# Patient Record
Sex: Male | Born: 1950 | Race: White | Hispanic: No | Marital: Married
Health system: Southern US, Community
[De-identification: ages and names within clinical notes are randomized; demographics above are authoritative.]

## PROBLEM LIST (undated history)

## (undated) DIAGNOSIS — I10 Essential (primary) hypertension: Secondary | ICD-10-CM

## (undated) DIAGNOSIS — I25118 Atherosclerotic heart disease of native coronary artery with other forms of angina pectoris: Secondary | ICD-10-CM

## (undated) DIAGNOSIS — E119 Type 2 diabetes mellitus without complications: Secondary | ICD-10-CM

## (undated) DIAGNOSIS — E785 Hyperlipidemia, unspecified: Secondary | ICD-10-CM

## (undated) HISTORY — PX: APPENDECTOMY: SHX54

## (undated) HISTORY — DX: Hyperlipidemia, unspecified: E78.5

## (undated) HISTORY — PX: ACOUSTIC NEUROMA RESECTION: SHX5713

## (undated) HISTORY — DX: Type 2 diabetes mellitus without complications: E11.9

## (undated) HISTORY — DX: Atherosclerotic heart disease of native coronary artery with other forms of angina pectoris: I25.118

---

## 2014-08-16 HISTORY — PX: TRANSTHORACIC ECHOCARDIOGRAM: SHX275

## 2014-08-21 DIAGNOSIS — I2510 Atherosclerotic heart disease of native coronary artery without angina pectoris: Secondary | ICD-10-CM

## 2014-08-21 HISTORY — PX: CARDIAC CATHETERIZATION: SHX172

## 2014-08-21 HISTORY — DX: Atherosclerotic heart disease of native coronary artery without angina pectoris: I25.10

## 2014-08-23 DIAGNOSIS — Z951 Presence of aortocoronary bypass graft: Secondary | ICD-10-CM

## 2014-08-23 HISTORY — PX: CORONARY ARTERY BYPASS GRAFT: SHX141

## 2014-08-23 HISTORY — DX: Presence of aortocoronary bypass graft: Z95.1

## 2016-01-30 DIAGNOSIS — E039 Hypothyroidism, unspecified: Secondary | ICD-10-CM | POA: Diagnosis not present

## 2016-01-30 DIAGNOSIS — Z9181 History of falling: Secondary | ICD-10-CM | POA: Diagnosis not present

## 2016-01-30 DIAGNOSIS — I2510 Atherosclerotic heart disease of native coronary artery without angina pectoris: Secondary | ICD-10-CM | POA: Diagnosis not present

## 2016-01-30 DIAGNOSIS — Z6838 Body mass index (BMI) 38.0-38.9, adult: Secondary | ICD-10-CM | POA: Diagnosis not present

## 2016-01-30 DIAGNOSIS — E1149 Type 2 diabetes mellitus with other diabetic neurological complication: Secondary | ICD-10-CM | POA: Diagnosis not present

## 2016-01-30 DIAGNOSIS — E669 Obesity, unspecified: Secondary | ICD-10-CM | POA: Diagnosis not present

## 2016-01-30 DIAGNOSIS — I10 Essential (primary) hypertension: Secondary | ICD-10-CM | POA: Diagnosis not present

## 2016-01-30 DIAGNOSIS — K219 Gastro-esophageal reflux disease without esophagitis: Secondary | ICD-10-CM | POA: Diagnosis not present

## 2016-01-30 DIAGNOSIS — E785 Hyperlipidemia, unspecified: Secondary | ICD-10-CM | POA: Diagnosis not present

## 2016-01-30 DIAGNOSIS — F329 Major depressive disorder, single episode, unspecified: Secondary | ICD-10-CM | POA: Diagnosis not present

## 2016-01-30 DIAGNOSIS — J209 Acute bronchitis, unspecified: Secondary | ICD-10-CM | POA: Diagnosis not present

## 2016-02-08 DIAGNOSIS — E039 Hypothyroidism, unspecified: Secondary | ICD-10-CM | POA: Diagnosis not present

## 2016-02-08 DIAGNOSIS — E1149 Type 2 diabetes mellitus with other diabetic neurological complication: Secondary | ICD-10-CM | POA: Diagnosis not present

## 2016-02-08 DIAGNOSIS — I2510 Atherosclerotic heart disease of native coronary artery without angina pectoris: Secondary | ICD-10-CM | POA: Diagnosis not present

## 2016-02-08 DIAGNOSIS — E669 Obesity, unspecified: Secondary | ICD-10-CM | POA: Diagnosis not present

## 2016-02-08 DIAGNOSIS — Z6837 Body mass index (BMI) 37.0-37.9, adult: Secondary | ICD-10-CM | POA: Diagnosis not present

## 2016-02-08 DIAGNOSIS — I10 Essential (primary) hypertension: Secondary | ICD-10-CM | POA: Diagnosis not present

## 2016-02-08 DIAGNOSIS — F329 Major depressive disorder, single episode, unspecified: Secondary | ICD-10-CM | POA: Diagnosis not present

## 2016-02-08 DIAGNOSIS — K219 Gastro-esophageal reflux disease without esophagitis: Secondary | ICD-10-CM | POA: Diagnosis not present

## 2016-02-08 DIAGNOSIS — E785 Hyperlipidemia, unspecified: Secondary | ICD-10-CM | POA: Diagnosis not present

## 2016-03-19 DIAGNOSIS — I10 Essential (primary) hypertension: Secondary | ICD-10-CM | POA: Diagnosis not present

## 2016-03-19 DIAGNOSIS — E669 Obesity, unspecified: Secondary | ICD-10-CM | POA: Diagnosis not present

## 2016-03-19 DIAGNOSIS — Z6837 Body mass index (BMI) 37.0-37.9, adult: Secondary | ICD-10-CM | POA: Diagnosis not present

## 2016-03-19 DIAGNOSIS — E785 Hyperlipidemia, unspecified: Secondary | ICD-10-CM | POA: Diagnosis not present

## 2016-03-19 DIAGNOSIS — I2510 Atherosclerotic heart disease of native coronary artery without angina pectoris: Secondary | ICD-10-CM | POA: Diagnosis not present

## 2016-03-19 DIAGNOSIS — E1149 Type 2 diabetes mellitus with other diabetic neurological complication: Secondary | ICD-10-CM | POA: Diagnosis not present

## 2016-03-19 DIAGNOSIS — K219 Gastro-esophageal reflux disease without esophagitis: Secondary | ICD-10-CM | POA: Diagnosis not present

## 2016-03-19 DIAGNOSIS — F329 Major depressive disorder, single episode, unspecified: Secondary | ICD-10-CM | POA: Diagnosis not present

## 2016-03-19 DIAGNOSIS — E039 Hypothyroidism, unspecified: Secondary | ICD-10-CM | POA: Diagnosis not present

## 2016-08-23 DIAGNOSIS — H6122 Impacted cerumen, left ear: Secondary | ICD-10-CM | POA: Diagnosis not present

## 2016-08-23 DIAGNOSIS — H6692 Otitis media, unspecified, left ear: Secondary | ICD-10-CM | POA: Diagnosis not present

## 2016-12-19 DIAGNOSIS — Z6838 Body mass index (BMI) 38.0-38.9, adult: Secondary | ICD-10-CM | POA: Diagnosis not present

## 2016-12-19 DIAGNOSIS — I889 Nonspecific lymphadenitis, unspecified: Secondary | ICD-10-CM | POA: Diagnosis not present

## 2016-12-19 DIAGNOSIS — I2510 Atherosclerotic heart disease of native coronary artery without angina pectoris: Secondary | ICD-10-CM | POA: Diagnosis not present

## 2016-12-19 DIAGNOSIS — I10 Essential (primary) hypertension: Secondary | ICD-10-CM | POA: Diagnosis not present

## 2016-12-19 DIAGNOSIS — Z1331 Encounter for screening for depression: Secondary | ICD-10-CM | POA: Diagnosis not present

## 2016-12-19 DIAGNOSIS — E1149 Type 2 diabetes mellitus with other diabetic neurological complication: Secondary | ICD-10-CM | POA: Diagnosis not present

## 2016-12-19 DIAGNOSIS — Z23 Encounter for immunization: Secondary | ICD-10-CM | POA: Diagnosis not present

## 2016-12-19 DIAGNOSIS — K219 Gastro-esophageal reflux disease without esophagitis: Secondary | ICD-10-CM | POA: Diagnosis not present

## 2016-12-19 DIAGNOSIS — F329 Major depressive disorder, single episode, unspecified: Secondary | ICD-10-CM | POA: Diagnosis not present

## 2016-12-19 DIAGNOSIS — E785 Hyperlipidemia, unspecified: Secondary | ICD-10-CM | POA: Diagnosis not present

## 2016-12-19 DIAGNOSIS — E039 Hypothyroidism, unspecified: Secondary | ICD-10-CM | POA: Diagnosis not present

## 2016-12-26 DIAGNOSIS — E669 Obesity, unspecified: Secondary | ICD-10-CM | POA: Diagnosis not present

## 2016-12-26 DIAGNOSIS — K219 Gastro-esophageal reflux disease without esophagitis: Secondary | ICD-10-CM | POA: Diagnosis not present

## 2016-12-26 DIAGNOSIS — E039 Hypothyroidism, unspecified: Secondary | ICD-10-CM | POA: Diagnosis not present

## 2016-12-26 DIAGNOSIS — F329 Major depressive disorder, single episode, unspecified: Secondary | ICD-10-CM | POA: Diagnosis not present

## 2016-12-26 DIAGNOSIS — E1149 Type 2 diabetes mellitus with other diabetic neurological complication: Secondary | ICD-10-CM | POA: Diagnosis not present

## 2016-12-26 DIAGNOSIS — I10 Essential (primary) hypertension: Secondary | ICD-10-CM | POA: Diagnosis not present

## 2016-12-26 DIAGNOSIS — E785 Hyperlipidemia, unspecified: Secondary | ICD-10-CM | POA: Diagnosis not present

## 2016-12-26 DIAGNOSIS — Z6838 Body mass index (BMI) 38.0-38.9, adult: Secondary | ICD-10-CM | POA: Diagnosis not present

## 2016-12-26 DIAGNOSIS — R590 Localized enlarged lymph nodes: Secondary | ICD-10-CM | POA: Diagnosis not present

## 2016-12-26 DIAGNOSIS — I2510 Atherosclerotic heart disease of native coronary artery without angina pectoris: Secondary | ICD-10-CM | POA: Diagnosis not present

## 2017-01-09 DIAGNOSIS — F329 Major depressive disorder, single episode, unspecified: Secondary | ICD-10-CM | POA: Diagnosis not present

## 2017-01-09 DIAGNOSIS — R590 Localized enlarged lymph nodes: Secondary | ICD-10-CM | POA: Diagnosis not present

## 2017-01-09 DIAGNOSIS — J208 Acute bronchitis due to other specified organisms: Secondary | ICD-10-CM | POA: Diagnosis not present

## 2017-01-09 DIAGNOSIS — I2510 Atherosclerotic heart disease of native coronary artery without angina pectoris: Secondary | ICD-10-CM | POA: Diagnosis not present

## 2017-01-09 DIAGNOSIS — E1149 Type 2 diabetes mellitus with other diabetic neurological complication: Secondary | ICD-10-CM | POA: Diagnosis not present

## 2017-01-09 DIAGNOSIS — K219 Gastro-esophageal reflux disease without esophagitis: Secondary | ICD-10-CM | POA: Diagnosis not present

## 2017-01-09 DIAGNOSIS — Z6838 Body mass index (BMI) 38.0-38.9, adult: Secondary | ICD-10-CM | POA: Diagnosis not present

## 2017-01-09 DIAGNOSIS — E039 Hypothyroidism, unspecified: Secondary | ICD-10-CM | POA: Diagnosis not present

## 2017-01-09 DIAGNOSIS — I10 Essential (primary) hypertension: Secondary | ICD-10-CM | POA: Diagnosis not present

## 2017-01-09 DIAGNOSIS — E785 Hyperlipidemia, unspecified: Secondary | ICD-10-CM | POA: Diagnosis not present

## 2017-01-10 DIAGNOSIS — R591 Generalized enlarged lymph nodes: Secondary | ICD-10-CM | POA: Diagnosis not present

## 2017-01-14 DIAGNOSIS — R590 Localized enlarged lymph nodes: Secondary | ICD-10-CM | POA: Diagnosis not present

## 2017-01-14 DIAGNOSIS — R599 Enlarged lymph nodes, unspecified: Secondary | ICD-10-CM | POA: Diagnosis not present

## 2017-01-14 DIAGNOSIS — I700 Atherosclerosis of aorta: Secondary | ICD-10-CM | POA: Diagnosis not present

## 2017-01-17 DIAGNOSIS — K219 Gastro-esophageal reflux disease without esophagitis: Secondary | ICD-10-CM | POA: Diagnosis not present

## 2017-01-17 DIAGNOSIS — E1149 Type 2 diabetes mellitus with other diabetic neurological complication: Secondary | ICD-10-CM | POA: Diagnosis not present

## 2017-01-17 DIAGNOSIS — R590 Localized enlarged lymph nodes: Secondary | ICD-10-CM | POA: Diagnosis not present

## 2017-01-17 DIAGNOSIS — Z6838 Body mass index (BMI) 38.0-38.9, adult: Secondary | ICD-10-CM | POA: Diagnosis not present

## 2017-01-17 DIAGNOSIS — I10 Essential (primary) hypertension: Secondary | ICD-10-CM | POA: Diagnosis not present

## 2017-01-17 DIAGNOSIS — E039 Hypothyroidism, unspecified: Secondary | ICD-10-CM | POA: Diagnosis not present

## 2017-01-17 DIAGNOSIS — F329 Major depressive disorder, single episode, unspecified: Secondary | ICD-10-CM | POA: Diagnosis not present

## 2017-01-17 DIAGNOSIS — J208 Acute bronchitis due to other specified organisms: Secondary | ICD-10-CM | POA: Diagnosis not present

## 2017-01-17 DIAGNOSIS — I2510 Atherosclerotic heart disease of native coronary artery without angina pectoris: Secondary | ICD-10-CM | POA: Diagnosis not present

## 2017-01-17 DIAGNOSIS — E785 Hyperlipidemia, unspecified: Secondary | ICD-10-CM | POA: Diagnosis not present

## 2017-01-23 DIAGNOSIS — R591 Generalized enlarged lymph nodes: Secondary | ICD-10-CM | POA: Diagnosis not present

## 2017-01-29 DIAGNOSIS — J342 Deviated nasal septum: Secondary | ICD-10-CM | POA: Diagnosis not present

## 2017-01-29 DIAGNOSIS — R221 Localized swelling, mass and lump, neck: Secondary | ICD-10-CM | POA: Diagnosis not present

## 2017-01-29 DIAGNOSIS — D490 Neoplasm of unspecified behavior of digestive system: Secondary | ICD-10-CM | POA: Diagnosis not present

## 2017-02-05 DIAGNOSIS — D490 Neoplasm of unspecified behavior of digestive system: Secondary | ICD-10-CM | POA: Diagnosis not present

## 2017-02-05 DIAGNOSIS — K118 Other diseases of salivary glands: Secondary | ICD-10-CM | POA: Diagnosis not present

## 2017-02-05 DIAGNOSIS — D119 Benign neoplasm of major salivary gland, unspecified: Secondary | ICD-10-CM | POA: Diagnosis not present

## 2017-02-13 DIAGNOSIS — R221 Localized swelling, mass and lump, neck: Secondary | ICD-10-CM | POA: Diagnosis not present

## 2017-02-13 DIAGNOSIS — J342 Deviated nasal septum: Secondary | ICD-10-CM | POA: Diagnosis not present

## 2017-02-13 DIAGNOSIS — R609 Edema, unspecified: Secondary | ICD-10-CM | POA: Diagnosis not present

## 2017-09-02 DIAGNOSIS — E039 Hypothyroidism, unspecified: Secondary | ICD-10-CM | POA: Diagnosis not present

## 2017-09-02 DIAGNOSIS — I10 Essential (primary) hypertension: Secondary | ICD-10-CM | POA: Diagnosis not present

## 2017-09-02 DIAGNOSIS — E785 Hyperlipidemia, unspecified: Secondary | ICD-10-CM | POA: Diagnosis not present

## 2017-09-02 DIAGNOSIS — R5382 Chronic fatigue, unspecified: Secondary | ICD-10-CM | POA: Diagnosis not present

## 2017-09-02 DIAGNOSIS — Z1339 Encounter for screening examination for other mental health and behavioral disorders: Secondary | ICD-10-CM | POA: Diagnosis not present

## 2017-09-02 DIAGNOSIS — E1149 Type 2 diabetes mellitus with other diabetic neurological complication: Secondary | ICD-10-CM | POA: Diagnosis not present

## 2017-09-02 DIAGNOSIS — K219 Gastro-esophageal reflux disease without esophagitis: Secondary | ICD-10-CM | POA: Diagnosis not present

## 2017-09-09 DIAGNOSIS — E039 Hypothyroidism, unspecified: Secondary | ICD-10-CM | POA: Diagnosis not present

## 2017-09-09 DIAGNOSIS — K219 Gastro-esophageal reflux disease without esophagitis: Secondary | ICD-10-CM | POA: Diagnosis not present

## 2017-09-09 DIAGNOSIS — E1149 Type 2 diabetes mellitus with other diabetic neurological complication: Secondary | ICD-10-CM | POA: Diagnosis not present

## 2017-09-09 DIAGNOSIS — I2510 Atherosclerotic heart disease of native coronary artery without angina pectoris: Secondary | ICD-10-CM | POA: Diagnosis not present

## 2017-09-23 DIAGNOSIS — I10 Essential (primary) hypertension: Secondary | ICD-10-CM | POA: Diagnosis not present

## 2017-09-23 DIAGNOSIS — E039 Hypothyroidism, unspecified: Secondary | ICD-10-CM | POA: Diagnosis not present

## 2017-09-23 DIAGNOSIS — E669 Obesity, unspecified: Secondary | ICD-10-CM | POA: Diagnosis not present

## 2017-09-23 DIAGNOSIS — E1149 Type 2 diabetes mellitus with other diabetic neurological complication: Secondary | ICD-10-CM | POA: Diagnosis not present

## 2017-09-23 DIAGNOSIS — K219 Gastro-esophageal reflux disease without esophagitis: Secondary | ICD-10-CM | POA: Diagnosis not present

## 2017-09-23 DIAGNOSIS — E785 Hyperlipidemia, unspecified: Secondary | ICD-10-CM | POA: Diagnosis not present

## 2017-09-23 DIAGNOSIS — I2510 Atherosclerotic heart disease of native coronary artery without angina pectoris: Secondary | ICD-10-CM | POA: Diagnosis not present

## 2017-09-23 DIAGNOSIS — F329 Major depressive disorder, single episode, unspecified: Secondary | ICD-10-CM | POA: Diagnosis not present

## 2017-09-23 DIAGNOSIS — E291 Testicular hypofunction: Secondary | ICD-10-CM | POA: Diagnosis not present

## 2017-10-01 DIAGNOSIS — Z125 Encounter for screening for malignant neoplasm of prostate: Secondary | ICD-10-CM | POA: Diagnosis not present

## 2017-10-01 DIAGNOSIS — Z6837 Body mass index (BMI) 37.0-37.9, adult: Secondary | ICD-10-CM | POA: Diagnosis not present

## 2017-10-01 DIAGNOSIS — Z23 Encounter for immunization: Secondary | ICD-10-CM | POA: Diagnosis not present

## 2017-10-01 DIAGNOSIS — Z9181 History of falling: Secondary | ICD-10-CM | POA: Diagnosis not present

## 2017-10-01 DIAGNOSIS — E785 Hyperlipidemia, unspecified: Secondary | ICD-10-CM | POA: Diagnosis not present

## 2017-10-01 DIAGNOSIS — E669 Obesity, unspecified: Secondary | ICD-10-CM | POA: Diagnosis not present

## 2017-10-01 DIAGNOSIS — Z0000 Encounter for general adult medical examination without abnormal findings: Secondary | ICD-10-CM | POA: Diagnosis not present

## 2017-10-01 DIAGNOSIS — Z139 Encounter for screening, unspecified: Secondary | ICD-10-CM | POA: Diagnosis not present

## 2017-10-09 DIAGNOSIS — E291 Testicular hypofunction: Secondary | ICD-10-CM | POA: Diagnosis not present

## 2017-10-09 DIAGNOSIS — E785 Hyperlipidemia, unspecified: Secondary | ICD-10-CM | POA: Diagnosis not present

## 2017-10-09 DIAGNOSIS — E1149 Type 2 diabetes mellitus with other diabetic neurological complication: Secondary | ICD-10-CM | POA: Diagnosis not present

## 2017-10-09 DIAGNOSIS — I10 Essential (primary) hypertension: Secondary | ICD-10-CM | POA: Diagnosis not present

## 2017-10-09 DIAGNOSIS — K219 Gastro-esophageal reflux disease without esophagitis: Secondary | ICD-10-CM | POA: Diagnosis not present

## 2017-10-09 DIAGNOSIS — I2510 Atherosclerotic heart disease of native coronary artery without angina pectoris: Secondary | ICD-10-CM | POA: Diagnosis not present

## 2017-10-09 DIAGNOSIS — E039 Hypothyroidism, unspecified: Secondary | ICD-10-CM | POA: Diagnosis not present

## 2017-10-09 DIAGNOSIS — F329 Major depressive disorder, single episode, unspecified: Secondary | ICD-10-CM | POA: Diagnosis not present

## 2017-10-09 DIAGNOSIS — Z23 Encounter for immunization: Secondary | ICD-10-CM | POA: Diagnosis not present

## 2017-10-23 DIAGNOSIS — E039 Hypothyroidism, unspecified: Secondary | ICD-10-CM | POA: Diagnosis not present

## 2017-10-23 DIAGNOSIS — E785 Hyperlipidemia, unspecified: Secondary | ICD-10-CM | POA: Diagnosis not present

## 2017-10-23 DIAGNOSIS — K219 Gastro-esophageal reflux disease without esophagitis: Secondary | ICD-10-CM | POA: Diagnosis not present

## 2017-10-23 DIAGNOSIS — F3341 Major depressive disorder, recurrent, in partial remission: Secondary | ICD-10-CM | POA: Diagnosis not present

## 2017-10-23 DIAGNOSIS — E118 Type 2 diabetes mellitus with unspecified complications: Secondary | ICD-10-CM | POA: Diagnosis not present

## 2017-10-23 DIAGNOSIS — Z23 Encounter for immunization: Secondary | ICD-10-CM | POA: Diagnosis not present

## 2017-10-23 DIAGNOSIS — E291 Testicular hypofunction: Secondary | ICD-10-CM | POA: Diagnosis not present

## 2017-10-23 DIAGNOSIS — I2510 Atherosclerotic heart disease of native coronary artery without angina pectoris: Secondary | ICD-10-CM | POA: Diagnosis not present

## 2017-10-23 DIAGNOSIS — I10 Essential (primary) hypertension: Secondary | ICD-10-CM | POA: Diagnosis not present

## 2017-10-29 DIAGNOSIS — E118 Type 2 diabetes mellitus with unspecified complications: Secondary | ICD-10-CM | POA: Diagnosis not present

## 2017-10-29 DIAGNOSIS — E785 Hyperlipidemia, unspecified: Secondary | ICD-10-CM | POA: Diagnosis not present

## 2017-10-29 DIAGNOSIS — I10 Essential (primary) hypertension: Secondary | ICD-10-CM | POA: Diagnosis not present

## 2017-10-29 DIAGNOSIS — I2510 Atherosclerotic heart disease of native coronary artery without angina pectoris: Secondary | ICD-10-CM | POA: Diagnosis not present

## 2017-10-29 DIAGNOSIS — E039 Hypothyroidism, unspecified: Secondary | ICD-10-CM | POA: Diagnosis not present

## 2017-10-29 DIAGNOSIS — K219 Gastro-esophageal reflux disease without esophagitis: Secondary | ICD-10-CM | POA: Diagnosis not present

## 2017-10-29 DIAGNOSIS — Z1211 Encounter for screening for malignant neoplasm of colon: Secondary | ICD-10-CM | POA: Diagnosis not present

## 2017-10-29 DIAGNOSIS — E291 Testicular hypofunction: Secondary | ICD-10-CM | POA: Diagnosis not present

## 2017-11-05 DIAGNOSIS — K219 Gastro-esophageal reflux disease without esophagitis: Secondary | ICD-10-CM | POA: Diagnosis not present

## 2017-11-05 DIAGNOSIS — I10 Essential (primary) hypertension: Secondary | ICD-10-CM | POA: Diagnosis not present

## 2017-11-05 DIAGNOSIS — E291 Testicular hypofunction: Secondary | ICD-10-CM | POA: Diagnosis not present

## 2017-11-05 DIAGNOSIS — E785 Hyperlipidemia, unspecified: Secondary | ICD-10-CM | POA: Diagnosis not present

## 2017-11-05 DIAGNOSIS — E039 Hypothyroidism, unspecified: Secondary | ICD-10-CM | POA: Diagnosis not present

## 2017-11-05 DIAGNOSIS — E118 Type 2 diabetes mellitus with unspecified complications: Secondary | ICD-10-CM | POA: Diagnosis not present

## 2017-11-05 DIAGNOSIS — F3341 Major depressive disorder, recurrent, in partial remission: Secondary | ICD-10-CM | POA: Diagnosis not present

## 2017-11-05 DIAGNOSIS — I2510 Atherosclerotic heart disease of native coronary artery without angina pectoris: Secondary | ICD-10-CM | POA: Diagnosis not present

## 2017-11-20 DIAGNOSIS — K219 Gastro-esophageal reflux disease without esophagitis: Secondary | ICD-10-CM | POA: Diagnosis not present

## 2017-11-20 DIAGNOSIS — F3341 Major depressive disorder, recurrent, in partial remission: Secondary | ICD-10-CM | POA: Diagnosis not present

## 2017-11-20 DIAGNOSIS — I2510 Atherosclerotic heart disease of native coronary artery without angina pectoris: Secondary | ICD-10-CM | POA: Diagnosis not present

## 2017-11-20 DIAGNOSIS — I10 Essential (primary) hypertension: Secondary | ICD-10-CM | POA: Diagnosis not present

## 2017-11-20 DIAGNOSIS — E039 Hypothyroidism, unspecified: Secondary | ICD-10-CM | POA: Diagnosis not present

## 2017-11-20 DIAGNOSIS — E291 Testicular hypofunction: Secondary | ICD-10-CM | POA: Diagnosis not present

## 2017-11-20 DIAGNOSIS — E785 Hyperlipidemia, unspecified: Secondary | ICD-10-CM | POA: Diagnosis not present

## 2017-11-20 DIAGNOSIS — E118 Type 2 diabetes mellitus with unspecified complications: Secondary | ICD-10-CM | POA: Diagnosis not present

## 2017-12-04 DIAGNOSIS — E291 Testicular hypofunction: Secondary | ICD-10-CM | POA: Diagnosis not present

## 2017-12-04 DIAGNOSIS — I10 Essential (primary) hypertension: Secondary | ICD-10-CM | POA: Diagnosis not present

## 2017-12-04 DIAGNOSIS — F3341 Major depressive disorder, recurrent, in partial remission: Secondary | ICD-10-CM | POA: Diagnosis not present

## 2017-12-04 DIAGNOSIS — I2510 Atherosclerotic heart disease of native coronary artery without angina pectoris: Secondary | ICD-10-CM | POA: Diagnosis not present

## 2017-12-04 DIAGNOSIS — E785 Hyperlipidemia, unspecified: Secondary | ICD-10-CM | POA: Diagnosis not present

## 2017-12-04 DIAGNOSIS — E039 Hypothyroidism, unspecified: Secondary | ICD-10-CM | POA: Diagnosis not present

## 2017-12-04 DIAGNOSIS — K219 Gastro-esophageal reflux disease without esophagitis: Secondary | ICD-10-CM | POA: Diagnosis not present

## 2017-12-04 DIAGNOSIS — E118 Type 2 diabetes mellitus with unspecified complications: Secondary | ICD-10-CM | POA: Diagnosis not present

## 2017-12-25 DIAGNOSIS — E118 Type 2 diabetes mellitus with unspecified complications: Secondary | ICD-10-CM | POA: Diagnosis not present

## 2017-12-25 DIAGNOSIS — K219 Gastro-esophageal reflux disease without esophagitis: Secondary | ICD-10-CM | POA: Diagnosis not present

## 2017-12-25 DIAGNOSIS — E291 Testicular hypofunction: Secondary | ICD-10-CM | POA: Diagnosis not present

## 2017-12-25 DIAGNOSIS — E785 Hyperlipidemia, unspecified: Secondary | ICD-10-CM | POA: Diagnosis not present

## 2017-12-25 DIAGNOSIS — I2510 Atherosclerotic heart disease of native coronary artery without angina pectoris: Secondary | ICD-10-CM | POA: Diagnosis not present

## 2017-12-25 DIAGNOSIS — Z6836 Body mass index (BMI) 36.0-36.9, adult: Secondary | ICD-10-CM | POA: Diagnosis not present

## 2017-12-25 DIAGNOSIS — E039 Hypothyroidism, unspecified: Secondary | ICD-10-CM | POA: Diagnosis not present

## 2017-12-25 DIAGNOSIS — F3341 Major depressive disorder, recurrent, in partial remission: Secondary | ICD-10-CM | POA: Diagnosis not present

## 2017-12-25 DIAGNOSIS — I10 Essential (primary) hypertension: Secondary | ICD-10-CM | POA: Diagnosis not present

## 2018-01-09 DIAGNOSIS — I2510 Atherosclerotic heart disease of native coronary artery without angina pectoris: Secondary | ICD-10-CM | POA: Diagnosis not present

## 2018-01-09 DIAGNOSIS — E118 Type 2 diabetes mellitus with unspecified complications: Secondary | ICD-10-CM | POA: Diagnosis not present

## 2018-01-09 DIAGNOSIS — E291 Testicular hypofunction: Secondary | ICD-10-CM | POA: Diagnosis not present

## 2018-01-09 DIAGNOSIS — F3341 Major depressive disorder, recurrent, in partial remission: Secondary | ICD-10-CM | POA: Diagnosis not present

## 2018-01-09 DIAGNOSIS — K219 Gastro-esophageal reflux disease without esophagitis: Secondary | ICD-10-CM | POA: Diagnosis not present

## 2018-01-09 DIAGNOSIS — E039 Hypothyroidism, unspecified: Secondary | ICD-10-CM | POA: Diagnosis not present

## 2018-01-09 DIAGNOSIS — E785 Hyperlipidemia, unspecified: Secondary | ICD-10-CM | POA: Diagnosis not present

## 2018-01-09 DIAGNOSIS — I10 Essential (primary) hypertension: Secondary | ICD-10-CM | POA: Diagnosis not present

## 2018-01-24 DIAGNOSIS — E291 Testicular hypofunction: Secondary | ICD-10-CM | POA: Diagnosis not present

## 2018-01-24 DIAGNOSIS — E785 Hyperlipidemia, unspecified: Secondary | ICD-10-CM | POA: Diagnosis not present

## 2018-01-24 DIAGNOSIS — I2510 Atherosclerotic heart disease of native coronary artery without angina pectoris: Secondary | ICD-10-CM | POA: Diagnosis not present

## 2018-01-24 DIAGNOSIS — E039 Hypothyroidism, unspecified: Secondary | ICD-10-CM | POA: Diagnosis not present

## 2018-01-24 DIAGNOSIS — E118 Type 2 diabetes mellitus with unspecified complications: Secondary | ICD-10-CM | POA: Diagnosis not present

## 2018-01-24 DIAGNOSIS — K219 Gastro-esophageal reflux disease without esophagitis: Secondary | ICD-10-CM | POA: Diagnosis not present

## 2018-01-24 DIAGNOSIS — F3341 Major depressive disorder, recurrent, in partial remission: Secondary | ICD-10-CM | POA: Diagnosis not present

## 2018-01-24 DIAGNOSIS — I10 Essential (primary) hypertension: Secondary | ICD-10-CM | POA: Diagnosis not present

## 2018-03-25 DIAGNOSIS — I10 Essential (primary) hypertension: Secondary | ICD-10-CM | POA: Diagnosis not present

## 2018-03-25 DIAGNOSIS — E291 Testicular hypofunction: Secondary | ICD-10-CM | POA: Diagnosis not present

## 2018-03-25 DIAGNOSIS — E039 Hypothyroidism, unspecified: Secondary | ICD-10-CM | POA: Diagnosis not present

## 2018-03-25 DIAGNOSIS — F3341 Major depressive disorder, recurrent, in partial remission: Secondary | ICD-10-CM | POA: Diagnosis not present

## 2018-03-25 DIAGNOSIS — E118 Type 2 diabetes mellitus with unspecified complications: Secondary | ICD-10-CM | POA: Diagnosis not present

## 2018-03-25 DIAGNOSIS — K219 Gastro-esophageal reflux disease without esophagitis: Secondary | ICD-10-CM | POA: Diagnosis not present

## 2018-03-25 DIAGNOSIS — E785 Hyperlipidemia, unspecified: Secondary | ICD-10-CM | POA: Diagnosis not present

## 2018-03-25 DIAGNOSIS — I2510 Atherosclerotic heart disease of native coronary artery without angina pectoris: Secondary | ICD-10-CM | POA: Diagnosis not present

## 2018-05-22 ENCOUNTER — Telehealth: Payer: Self-pay | Admitting: *Deleted

## 2018-05-22 DIAGNOSIS — F3341 Major depressive disorder, recurrent, in partial remission: Secondary | ICD-10-CM | POA: Diagnosis not present

## 2018-05-22 DIAGNOSIS — E291 Testicular hypofunction: Secondary | ICD-10-CM | POA: Diagnosis not present

## 2018-05-22 DIAGNOSIS — I2510 Atherosclerotic heart disease of native coronary artery without angina pectoris: Secondary | ICD-10-CM | POA: Diagnosis not present

## 2018-05-22 DIAGNOSIS — E785 Hyperlipidemia, unspecified: Secondary | ICD-10-CM | POA: Diagnosis not present

## 2018-05-22 DIAGNOSIS — K219 Gastro-esophageal reflux disease without esophagitis: Secondary | ICD-10-CM | POA: Diagnosis not present

## 2018-05-22 DIAGNOSIS — I10 Essential (primary) hypertension: Secondary | ICD-10-CM | POA: Diagnosis not present

## 2018-05-22 DIAGNOSIS — E118 Type 2 diabetes mellitus with unspecified complications: Secondary | ICD-10-CM | POA: Diagnosis not present

## 2018-05-22 DIAGNOSIS — M79671 Pain in right foot: Secondary | ICD-10-CM | POA: Diagnosis not present

## 2018-05-22 NOTE — Telephone Encounter (Signed)
I called pt and he states he is diabetic and has a hard place on the right little toe and he usually is able to peel it off after a shower, but this last time it got painful and red with a white spot and he popped it and he has some relief but he can't weight until next week. Pt denies fever. I told pt to cover the area with a light neosporin dressing and we would get him in tomorrow in [place].

## 2018-05-22 NOTE — Telephone Encounter (Signed)
Spoke with patient in regards to getting him scheduled for an appt and the patient has Robert Santiago for insurance which requires a referral. Pt contacted his primary care earlier today but they have not sent the referral/authorization. Pt was instructed to go to an Urgent Care/ED to have his foot/toes looked at. Pt stated understanding and said he would call back later on to schedule an appt to start routine diabetic care and neuropathy maintenance.  Marland Kitchen

## 2018-05-22 NOTE — Telephone Encounter (Signed)
Pt states he is having a terrible problem with a toe, and has a toe with a lot of pain and his doctor said he would prescribe some pain medication and get him in to be seen in a week.

## 2018-06-02 DIAGNOSIS — H2513 Age-related nuclear cataract, bilateral: Secondary | ICD-10-CM | POA: Diagnosis not present

## 2018-06-25 DIAGNOSIS — I2510 Atherosclerotic heart disease of native coronary artery without angina pectoris: Secondary | ICD-10-CM | POA: Diagnosis not present

## 2018-06-25 DIAGNOSIS — F3341 Major depressive disorder, recurrent, in partial remission: Secondary | ICD-10-CM | POA: Diagnosis not present

## 2018-06-25 DIAGNOSIS — E291 Testicular hypofunction: Secondary | ICD-10-CM | POA: Diagnosis not present

## 2018-06-25 DIAGNOSIS — K219 Gastro-esophageal reflux disease without esophagitis: Secondary | ICD-10-CM | POA: Diagnosis not present

## 2018-06-25 DIAGNOSIS — I10 Essential (primary) hypertension: Secondary | ICD-10-CM | POA: Diagnosis not present

## 2018-06-25 DIAGNOSIS — E039 Hypothyroidism, unspecified: Secondary | ICD-10-CM | POA: Diagnosis not present

## 2018-06-25 DIAGNOSIS — E118 Type 2 diabetes mellitus with unspecified complications: Secondary | ICD-10-CM | POA: Diagnosis not present

## 2018-06-25 DIAGNOSIS — E785 Hyperlipidemia, unspecified: Secondary | ICD-10-CM | POA: Diagnosis not present

## 2018-08-16 DIAGNOSIS — U071 COVID-19: Secondary | ICD-10-CM

## 2018-08-16 HISTORY — DX: COVID-19: U07.1

## 2018-08-20 DIAGNOSIS — K219 Gastro-esophageal reflux disease without esophagitis: Secondary | ICD-10-CM | POA: Diagnosis not present

## 2018-08-20 DIAGNOSIS — I10 Essential (primary) hypertension: Secondary | ICD-10-CM | POA: Diagnosis not present

## 2018-08-20 DIAGNOSIS — E118 Type 2 diabetes mellitus with unspecified complications: Secondary | ICD-10-CM | POA: Diagnosis not present

## 2018-08-20 DIAGNOSIS — E291 Testicular hypofunction: Secondary | ICD-10-CM | POA: Diagnosis not present

## 2018-08-20 DIAGNOSIS — E785 Hyperlipidemia, unspecified: Secondary | ICD-10-CM | POA: Diagnosis not present

## 2018-08-20 DIAGNOSIS — F3341 Major depressive disorder, recurrent, in partial remission: Secondary | ICD-10-CM | POA: Diagnosis not present

## 2018-08-20 DIAGNOSIS — I2510 Atherosclerotic heart disease of native coronary artery without angina pectoris: Secondary | ICD-10-CM | POA: Diagnosis not present

## 2018-08-20 DIAGNOSIS — Z20828 Contact with and (suspected) exposure to other viral communicable diseases: Secondary | ICD-10-CM | POA: Diagnosis not present

## 2018-08-22 DIAGNOSIS — F3341 Major depressive disorder, recurrent, in partial remission: Secondary | ICD-10-CM | POA: Diagnosis not present

## 2018-08-22 DIAGNOSIS — E118 Type 2 diabetes mellitus with unspecified complications: Secondary | ICD-10-CM | POA: Diagnosis not present

## 2018-08-22 DIAGNOSIS — U071 COVID-19: Secondary | ICD-10-CM | POA: Diagnosis not present

## 2018-08-22 DIAGNOSIS — I2510 Atherosclerotic heart disease of native coronary artery without angina pectoris: Secondary | ICD-10-CM | POA: Diagnosis not present

## 2018-08-22 DIAGNOSIS — E291 Testicular hypofunction: Secondary | ICD-10-CM | POA: Diagnosis not present

## 2018-08-22 DIAGNOSIS — E785 Hyperlipidemia, unspecified: Secondary | ICD-10-CM | POA: Diagnosis not present

## 2018-08-22 DIAGNOSIS — I10 Essential (primary) hypertension: Secondary | ICD-10-CM | POA: Diagnosis not present

## 2018-08-22 DIAGNOSIS — K219 Gastro-esophageal reflux disease without esophagitis: Secondary | ICD-10-CM | POA: Diagnosis not present

## 2018-08-26 DIAGNOSIS — F3341 Major depressive disorder, recurrent, in partial remission: Secondary | ICD-10-CM | POA: Diagnosis not present

## 2018-08-26 DIAGNOSIS — K219 Gastro-esophageal reflux disease without esophagitis: Secondary | ICD-10-CM | POA: Diagnosis not present

## 2018-08-26 DIAGNOSIS — E785 Hyperlipidemia, unspecified: Secondary | ICD-10-CM | POA: Diagnosis not present

## 2018-08-26 DIAGNOSIS — E291 Testicular hypofunction: Secondary | ICD-10-CM | POA: Diagnosis not present

## 2018-08-26 DIAGNOSIS — I2510 Atherosclerotic heart disease of native coronary artery without angina pectoris: Secondary | ICD-10-CM | POA: Diagnosis not present

## 2018-08-26 DIAGNOSIS — E118 Type 2 diabetes mellitus with unspecified complications: Secondary | ICD-10-CM | POA: Diagnosis not present

## 2018-08-26 DIAGNOSIS — U071 COVID-19: Secondary | ICD-10-CM | POA: Diagnosis not present

## 2018-08-26 DIAGNOSIS — I10 Essential (primary) hypertension: Secondary | ICD-10-CM | POA: Diagnosis not present

## 2018-09-02 DIAGNOSIS — F3341 Major depressive disorder, recurrent, in partial remission: Secondary | ICD-10-CM | POA: Diagnosis not present

## 2018-09-02 DIAGNOSIS — I10 Essential (primary) hypertension: Secondary | ICD-10-CM | POA: Diagnosis not present

## 2018-09-02 DIAGNOSIS — K219 Gastro-esophageal reflux disease without esophagitis: Secondary | ICD-10-CM | POA: Diagnosis not present

## 2018-09-02 DIAGNOSIS — E785 Hyperlipidemia, unspecified: Secondary | ICD-10-CM | POA: Diagnosis not present

## 2018-09-02 DIAGNOSIS — U071 COVID-19: Secondary | ICD-10-CM | POA: Diagnosis not present

## 2018-09-02 DIAGNOSIS — E291 Testicular hypofunction: Secondary | ICD-10-CM | POA: Diagnosis not present

## 2018-09-02 DIAGNOSIS — I2510 Atherosclerotic heart disease of native coronary artery without angina pectoris: Secondary | ICD-10-CM | POA: Diagnosis not present

## 2018-09-02 DIAGNOSIS — E118 Type 2 diabetes mellitus with unspecified complications: Secondary | ICD-10-CM | POA: Diagnosis not present

## 2018-09-09 DIAGNOSIS — E291 Testicular hypofunction: Secondary | ICD-10-CM | POA: Diagnosis not present

## 2018-09-09 DIAGNOSIS — I2510 Atherosclerotic heart disease of native coronary artery without angina pectoris: Secondary | ICD-10-CM | POA: Diagnosis not present

## 2018-09-09 DIAGNOSIS — K219 Gastro-esophageal reflux disease without esophagitis: Secondary | ICD-10-CM | POA: Diagnosis not present

## 2018-09-09 DIAGNOSIS — E118 Type 2 diabetes mellitus with unspecified complications: Secondary | ICD-10-CM | POA: Diagnosis not present

## 2018-09-09 DIAGNOSIS — E785 Hyperlipidemia, unspecified: Secondary | ICD-10-CM | POA: Diagnosis not present

## 2018-09-09 DIAGNOSIS — U071 COVID-19: Secondary | ICD-10-CM | POA: Diagnosis not present

## 2018-09-09 DIAGNOSIS — F3341 Major depressive disorder, recurrent, in partial remission: Secondary | ICD-10-CM | POA: Diagnosis not present

## 2018-09-09 DIAGNOSIS — I10 Essential (primary) hypertension: Secondary | ICD-10-CM | POA: Diagnosis not present

## 2018-09-26 DIAGNOSIS — F3341 Major depressive disorder, recurrent, in partial remission: Secondary | ICD-10-CM | POA: Diagnosis not present

## 2018-09-26 DIAGNOSIS — I2510 Atherosclerotic heart disease of native coronary artery without angina pectoris: Secondary | ICD-10-CM | POA: Diagnosis not present

## 2018-09-26 DIAGNOSIS — E118 Type 2 diabetes mellitus with unspecified complications: Secondary | ICD-10-CM | POA: Diagnosis not present

## 2018-09-26 DIAGNOSIS — E039 Hypothyroidism, unspecified: Secondary | ICD-10-CM | POA: Diagnosis not present

## 2018-09-26 DIAGNOSIS — E785 Hyperlipidemia, unspecified: Secondary | ICD-10-CM | POA: Diagnosis not present

## 2018-09-26 DIAGNOSIS — K219 Gastro-esophageal reflux disease without esophagitis: Secondary | ICD-10-CM | POA: Diagnosis not present

## 2018-09-26 DIAGNOSIS — E669 Obesity, unspecified: Secondary | ICD-10-CM | POA: Diagnosis not present

## 2018-09-26 DIAGNOSIS — E291 Testicular hypofunction: Secondary | ICD-10-CM | POA: Diagnosis not present

## 2018-09-26 DIAGNOSIS — Z23 Encounter for immunization: Secondary | ICD-10-CM | POA: Diagnosis not present

## 2018-09-26 DIAGNOSIS — I10 Essential (primary) hypertension: Secondary | ICD-10-CM | POA: Diagnosis not present

## 2018-12-19 ENCOUNTER — Ambulatory Visit (INDEPENDENT_AMBULATORY_CARE_PROVIDER_SITE_OTHER): Payer: Medicare HMO | Admitting: Cardiology

## 2018-12-19 ENCOUNTER — Encounter: Payer: Self-pay | Admitting: *Deleted

## 2018-12-19 ENCOUNTER — Other Ambulatory Visit: Payer: Self-pay

## 2018-12-19 ENCOUNTER — Encounter: Payer: Self-pay | Admitting: Cardiology

## 2018-12-19 VITALS — BP 108/62 | HR 62 | Ht 72.0 in | Wt 282.0 lb

## 2018-12-19 DIAGNOSIS — I10 Essential (primary) hypertension: Secondary | ICD-10-CM

## 2018-12-19 DIAGNOSIS — G4730 Sleep apnea, unspecified: Secondary | ICD-10-CM | POA: Diagnosis not present

## 2018-12-19 DIAGNOSIS — R0602 Shortness of breath: Secondary | ICD-10-CM

## 2018-12-19 DIAGNOSIS — I200 Unstable angina: Secondary | ICD-10-CM

## 2018-12-19 DIAGNOSIS — E782 Mixed hyperlipidemia: Secondary | ICD-10-CM | POA: Diagnosis not present

## 2018-12-19 DIAGNOSIS — E119 Type 2 diabetes mellitus without complications: Secondary | ICD-10-CM

## 2018-12-19 DIAGNOSIS — I25118 Atherosclerotic heart disease of native coronary artery with other forms of angina pectoris: Secondary | ICD-10-CM

## 2018-12-19 DIAGNOSIS — E039 Hypothyroidism, unspecified: Secondary | ICD-10-CM

## 2018-12-19 HISTORY — DX: Unstable angina: I20.0

## 2018-12-19 MED ORDER — NITROGLYCERIN 0.4 MG SL SUBL: 0.4000 mg | SUBLINGUAL_TABLET | SUBLINGUAL | 3 refills | Status: DC | PRN

## 2018-12-19 NOTE — Patient Instructions (Signed)
Medication Instructions:  Your physician has recommended you make the following change in your medication:   START nitroglycerin as needed for chest pain: When having chest pain, stop what you are doing and sit down. Take 1 nitro, wait 5 minutes. Still having chest pain, take 1 nitro, wait 5 minutes. Still having chest pain, take 1 nitro, dial 911. Total of 3 nitro in 15 minutes.  *If you need a refill on your cardiac medications before your next appointment, please call your pharmacy*  Lab Work: None  If you have labs (blood work) drawn today and your tests are completely normal, you will receive your results only by:  [place] (if you have MyChart) OR  A paper copy in the mail If you have any lab test that is abnormal or we need to change your treatment, we will call you to review the results.  Testing/Procedures: You had an EKG today.   Your physician has requested that you have a lexiscan myoview. For further information please visit [email or web address]. Please follow instruction sheet, as given.  Follow-Up: At [facility], you and your health needs are our priority.  As part of our continuing mission to provide you with exceptional heart care, we have created designated Provider Care Teams.  These Care Teams include your primary Cardiologist (physician) and Advanced Practice Providers (APPs -  Physician Assistants and Nurse Practitioners) who all work together to provide you with the care you need, when you need it.  Your next appointment:   6 week(s)  The format for your next appointment:   In Person  Provider:   [name], MD   Nitroglycerin sublingual tablets What is this medicine? NITROGLYCERIN (nye troe GLI ser in) is a type of vasodilator. It relaxes blood vessels, increasing the blood and oxygen supply to your heart. This medicine is used to relieve chest pain caused by angina. It is also used to prevent chest pain before activities like climbing stairs,  going outdoors in cold weather, or sexual activity. This medicine may be used for other purposes; ask your health care provider or pharmacist if you have questions. COMMON BRAND NAME(S): Nitroquick, Nitrostat, Nitrotab What should I tell my health care provider before I take this medicine? They need to know if you have any of these conditions:  anemia  head injury, recent stroke, or bleeding in the brain  liver disease  previous heart attack  an unusual or allergic reaction to nitroglycerin, other medicines, foods, dyes, or preservatives  pregnant or trying to get pregnant  breast-feeding How should I use this medicine? Take this medicine by mouth as needed. At the first sign of an angina attack (chest pain or tightness) place one tablet under your tongue. You can also take this medicine 5 to 10 minutes before an event likely to produce chest pain. Follow the directions on the prescription label. Let the tablet dissolve under the tongue. Do not swallow whole. Replace the dose if you accidentally swallow it. It will help if your mouth is not dry. Saliva around the tablet will help it to dissolve more quickly. Do not eat or drink, smoke or chew tobacco while a tablet is dissolving. If you are not better within 5 minutes after taking ONE dose of nitroglycerin, call 9-1-1 immediately to seek emergency medical care. Do not take more than 3 nitroglycerin tablets over 15 minutes. If you take this medicine often to relieve symptoms of angina, your doctor or health care professional may provide you with different  instructions to manage your symptoms. If symptoms do not go away after following these instructions, it is important to call 9-1-1 immediately. Do not take more than 3 nitroglycerin tablets over 15 minutes. Talk to your pediatrician regarding the use of this medicine in children. Special care may be needed. Overdosage: If you think you have taken too much of this medicine contact a poison  control center or emergency room at once. NOTE: This medicine is only for you. Do not share this medicine with others. What if I miss a dose? This does not apply. This medicine is only used as needed. What may interact with this medicine? Do not take this medicine with any of the following medications:  certain migraine medicines like ergotamine and dihydroergotamine (DHE)  medicines used to treat erectile dysfunction like sildenafil, tadalafil, and vardenafil  riociguat This medicine may also interact with the following medications:  alteplase  aspirin  heparin  medicines for high blood pressure  medicines for mental depression  other medicines used to treat angina  phenothiazines like chlorpromazine, mesoridazine, prochlorperazine, thioridazine This list may not describe all possible interactions. Give your health care provider a list of all the medicines, herbs, non-prescription drugs, or dietary supplements you use. Also tell them if you smoke, drink alcohol, or use illegal drugs. Some items may interact with your medicine. What should I watch for while using this medicine? Tell your doctor or health care professional if you feel your medicine is no longer working. Keep this medicine with you at all times. Sit or lie down when you take your medicine to prevent falling if you feel dizzy or faint after using it. Try to remain calm. This will help you to feel better faster. If you feel dizzy, take several deep breaths and lie down with your feet propped up, or bend forward with your head resting between your knees. You may get drowsy or dizzy. Do not drive, use machinery, or do anything that needs mental alertness until you know how this drug affects you. Do not stand or sit up quickly, especially if you are an older patient. This reduces the risk of dizzy or fainting spells. Alcohol can make you more drowsy and dizzy. Avoid alcoholic drinks. Do not treat yourself for coughs, colds,  or pain while you are taking this medicine without asking your doctor or health care professional for advice. Some ingredients may increase your blood pressure. What side effects may I notice from receiving this medicine? Side effects that you should report to your doctor or health care professional as soon as possible:  blurred vision  dry mouth  skin rash  sweating  the feeling of extreme pressure in the head  unusually weak or tired Side effects that usually do not require medical attention (report to your doctor or health care professional if they continue or are bothersome):  flushing of the face or neck  headache  irregular heartbeat, palpitations  nausea, vomiting This list may not describe all possible side effects. Call your doctor for medical advice about side effects. You may report side effects to FDA at 1-800-FDA-1088. Where should I keep my medicine? Keep out of the reach of children. Store at room temperature between 20 and 25 degrees C (68 and 77 degrees F). Store in [facility]. Protect from light and moisture. Keep tightly closed. Throw away any unused medicine after the expiration date. NOTE: This sheet is a summary. It may not cover all possible information. If you have questions about  this medicine, talk to your doctor, pharmacist, or health care provider.  2020 Elsevier/Gold Standard (2012-10-30 17:57:36)    Cardiac Nuclear Scan A cardiac nuclear scan is a test that is done to check the flow of blood to your heart. It is done when you are resting and when you are exercising. The test looks for problems such as:  Not enough blood reaching a portion of the heart.  The heart muscle not working as it should. You may need this test if:  You have heart disease.  You have had lab results that are not normal.  You have had heart surgery or a balloon procedure to open up blocked arteries (angioplasty).  You have chest pain.  You have shortness of  breath. In this test, a special dye (tracer) is put into your bloodstream. The tracer will travel to your heart. A camera will then take pictures of your heart to see how the tracer moves through your heart. This test is usually done at a hospital and takes 2-4 hours. Tell a doctor about:  Any allergies you have.  All medicines you are taking, including vitamins, herbs, eye drops, creams, and over-the-counter medicines.  Any problems you or family members have had with anesthetic medicines.  Any blood disorders you have.  Any surgeries you have had.  Any medical conditions you have.  Whether you are pregnant or may be pregnant. What are the risks? Generally, this is a safe test. However, problems may occur, such as:  Serious chest pain and heart attack. This is only a risk if the stress portion of the test is done.  Rapid heartbeat.  A feeling of warmth in your chest. This feeling usually does not last long.  Allergic reaction to the tracer. What happens before the test?  Ask your doctor about changing or stopping your normal medicines. This is important.  Follow instructions from your doctor about what you cannot eat or drink.  Remove your jewelry on the day of the test. What happens during the test?  An IV tube will be inserted into one of your veins.  Your doctor will give you a small amount of tracer through the IV tube.  You will wait for 20-40 minutes while the tracer moves through your bloodstream.  Your heart will be monitored with an electrocardiogram (ECG).  You will lie down on an exam table.  Pictures of your heart will be taken for about 15-20 minutes.  You may also have a stress test. For this test, one of these things may be done: ? You will be asked to exercise on a treadmill or a stationary bike. ? You will be given medicines that will make your heart work harder. This is done if you are unable to exercise.  When blood flow to your heart has  peaked, a tracer will again be given through the IV tube.  After 20-40 minutes, you will get back on the exam table. More pictures will be taken of your heart.  Depending on the tracer that is used, more pictures may need to be taken 3-4 hours later.  Your IV tube will be removed when the test is over. The test may vary among doctors and hospitals. What happens after the test?  Ask your doctor: ? Whether you can return to your normal schedule, including diet, activities, and medicines. ? Whether you should drink more fluids. This will help to remove the tracer from your body. Drink enough fluid to keep your pee (  urine) pale yellow.  Ask your doctor, or the department that is doing the test: ? When will my results be ready? ? How will I get my results? Summary  A cardiac nuclear scan is a test that is done to check the flow of blood to your heart.  Tell your doctor whether you are pregnant or may be pregnant.  Before the test, ask your doctor about changing or stopping your normal medicines. This is important.  Ask your doctor whether you can return to your normal activities. You may be asked to drink more fluids. This information is not intended to replace advice given to you by your health care provider. Make sure you discuss any questions you have with your health care provider. Document Released: 06/17/2017 Document Revised: 04/23/2018 Document Reviewed: 06/17/2017 Elsevier Patient Education  2020 [employer].

## 2018-12-19 NOTE — Progress Notes (Signed)
Cardiology Office Note:    Date:  12/19/2018   ID:  Robert Santiago, DOB 1950-08-24, MRN [id number]  PCP:  [name], MD  Cardiologist:  [name], MD    Referring MD: [name], MD    ASSESSMENT:    1. Coronary artery disease of native artery of native heart with stable angina pectoris ([place])   2. Essential hypertension   3. Mixed hyperlipidemia   4. Type 2 diabetes mellitus without complication, without long-term current use of insulin ([place])   5. Acquired hypothyroidism   6. Sleep apnea, unspecified type    PLAN:    In order of problems listed above:  1. His background history of multivessel CAD bypass surgery 2016 is now having exertional symptoms and may represent ischemia.  He will undergo further evaluation with a myocardial perfusion study and if he has demonstrable ischemia favor coronary angiography.  He will be given a prescription for nitroglycerin and will continue his aspirin beta-blocker and statin. 2. Stable continue current treatment including ACE inhibitor 3. Continue his low intensity statin if he has an abnormal perfusion study I would favor adding Zetia or PCSK9 to achieve LDLs of under 50 4. Poorly controlled strongly encouraged weight loss likely will need insulin therapy 5. Stable continue this thyroid supplement 6. I did not address this issue during this visit   Next appointment: 6 weeks   Medication Adjustments/Labs and Tests Ordered: Current medicines are reviewed at length with the patient today.  Concerns regarding medicines are outlined above.  No orders of the defined types were placed in this encounter.  No orders of the defined types were placed in this encounter.   Chief Complaint  Patient presents with  . Follow-up    To reestablish care yes  . Coronary Artery Disease    History of Present Illness:    Robert Santiago is a 68 y.o. male with a hx of coronary artery disease bypass surgery type 2 diabetes hypertension hyperlipidemia and  superficial thrombophlebitis of the lower extremity being seen today to reestablish cardiology care.  He was last seen 09/28/2014 at [facility] cardiology.    Compliance with diet, lifestyle and medications: Yes  He underwent CABG 78/29/5621 uncomplicated hospital course.  He had a left internal thoracic artery anastomosed to the LAD, vein graft 1 to the first diagonal, vein graft to to the ramus, vein graft 3 to obtuse marginal, and vein graft for to the right side of posterior lateral branch.  Left heart cath 08/20/2014: Diagnostic Procedure Summary 1. Severe, diffuse multi-vessel CAD 2. Normal LV function Diagnostic Procedure Recommendations CTS consult.  Angiographic findings  Cardiac Arteries and Lesion Findings LMCA: Normal. LAD:  Lesion on Prox LAD: 80% stenosis.  Lesion on Dist LAD: 80% stenosis. LCx:  Lesion on Prox CX: Proximal subsection.80% stenosis.  Lesion on 1st Ob Marg: 80% stenosis. RCA:  Lesion on Prox RCA: 95% stenosis.  Lesion on Mid RCA: Proximal subsection.95% stenosis.  Lesion on Mid RCA: Distal subsection.95% stenosis. Procedure Data Procedure Date Date: 08/20/2014 Start: 17:30 Contrast Material  - Omnipaque90 ml Fluoroscopy Time: Diagnostic: 2:01 minutes. Total: 2:01 minutes. Admission Data Admission Date: 08/20/2014 Admission Status: Emergency department.  Coronary Tree  Dominance: Right VA LV function assessed [id number]. Ejection Fraction  - Method: LV gram. EF%: 65.  08/21/2014: Cholesterol 135 HDL 38 LDL 62 triglycerides 176  He had 2 instances of exertional shortness of breath that was severe limiting and caused him to rest a few minutes to recover  1 was walking uphill long distance and the other was unloading a trailer using his upper extremities he has had no chest pain.  Outside of this he has no exertional symptoms no edema orthopnea palpitation or syncope.  He has a history of disequilibrium has had acoustic neuroma removed and recently  has had episodes of dizziness no true vertigo no loss of consciousness.  Because of the symptoms he seeks medical attention concerned about progression of heart disease his EKG shows sinus rhythm right bundle branch block incomplete and 2 PVCs.  He is at risk for recurrent ischemia will undergo myocardial perfusion study in my office.  Unfortunately his diabetes is poorly controlled with an A1c of 9.2%  Past Medical History:  Diagnosis Date  . Coronary artery disease   . COVID-19 08/2018  . Diabetes mellitus without complication ([place])   . Hyperlipidemia   . Hypertension     Past Surgical History:  Procedure Laterality Date  . ACOUSTIC NEUROMA RESECTION    . APPENDECTOMY    . CARDIAC CATHETERIZATION    . CORONARY ARTERY BYPASS GRAFT      Current Medications: Current Meds  Medication Sig  . ALPRAZolam (XANAX) 0.25 MG tablet Take 1 tablet by mouth as needed.  Marland Kitchen aspirin EC 81 MG tablet Take 81 mg by mouth daily.  . benazepril (LOTENSIN) 20 MG tablet Take 20 mg by mouth daily.  . citalopram (CELEXA) 40 MG tablet Take 40 mg by mouth daily.  Marland Kitchen glimepiride (AMARYL) 4 MG tablet Take 1 tablet by mouth 2 (two) times daily.  Marland Kitchen JANUVIA 100 MG tablet Take 1 tablet by mouth daily.  Marland Kitchen levothyroxine (SYNTHROID) 175 MCG tablet Take 1 tablet by mouth daily.  . metFORMIN (GLUCOPHAGE) 850 MG tablet Take 1 tablet by mouth 2 (two) times daily.  . metoprolol tartrate (LOPRESSOR) 25 MG tablet Take 1 tablet by mouth 2 (two) times daily.  Marland Kitchen omeprazole (PRILOSEC) 40 MG capsule Take 40 mg by mouth daily.  . pravastatin (PRAVACHOL) 40 MG tablet Take 2 tablets by mouth daily.     Allergies:   Patient has no known allergies.   Social History   Socioeconomic History  . Marital status: Married    Spouse name: Not on file  . Number of children: Not on file  . Years of education: Not on file  . Highest education level: Not on file  Occupational History  . Not on file  Social Needs  . Financial resource  strain: Not on file  . Food insecurity    Worry: Not on file    Inability: Not on file  . Transportation needs    Medical: Not on file    Non-medical: Not on file  Tobacco Use  . Smoking status: Former Smoker    Packs/day: 1.00    Years: 30.00    Pack years: 30.00    Types: Cigarettes    Quit date: 2003    Years since quitting: 17.9  . Smokeless tobacco: Never Used  Substance and Sexual Activity  . Alcohol use: Yes    Comment: occ  . Drug use: Never  . Sexual activity: Not on file  Lifestyle  . Physical activity    Days per week: Not on file    Minutes per session: Not on file  . Stress: Not on file  Relationships  . Social Herbalist on phone: Not on file    Gets together: Not on file    Attends religious  service: Not on file    Active member of club or organization: Not on file    Attends meetings of clubs or organizations: Not on file    Relationship status: Not on file  Other Topics Concern  . Not on file  Social History Narrative  . Not on file     Family History: The patient's family history includes COPD in his mother; Heart attack in his brother; Heart disease in his brother, brother, brother, and father; Heart failure in his mother; Hyperlipidemia in his father and mother; Hypertension in his father and mother. ROS:  Review of Systems  Constitution: Positive for weight gain.  HENT: Negative.   Eyes: Negative.   Cardiovascular: Positive for dyspnea on exertion.  Respiratory: Positive for shortness of breath.   Endocrine: Negative.   Hematologic/Lymphatic: Negative.   Skin: Negative.   Musculoskeletal: Negative.   Gastrointestinal: Negative.   Genitourinary: Negative.   Neurological: Positive for dizziness.  Psychiatric/Behavioral: Negative.   Allergic/Immunologic: Negative.    Please see the history of present illness.    All other systems reviewed and are negative.  EKGs/Labs/Other Studies Reviewed:    The following studies were  reviewed today:  EKG:  EKG ordered today and personally reviewed.  The ekg ordered today demonstrates sinus rhythm right bundle branch block 2 PVCs  Recent Labs: 09/26/2018 cholesterol 169 HDL 42 LDL 90 A1c 9.2% creatinine 0.81 TSH normal  Physical Exam:    VS:  BP 108/62 (BP Location: Left Arm, Patient Position: Sitting, Cuff Size: Large)   Pulse 62   Ht 6' (1.829 m)   Wt 282 lb (127.9 kg)   SpO2 98%   BMI 38.25 kg/m     Wt Readings from Last 3 Encounters:  12/19/18 282 lb (127.9 kg)     GEN: Marked obesity BMI exceeds 35 well nourished, well developed in no acute distress HEENT: Normal NECK: No JVD; No carotid bruits LYMPHATICS: No lymphadenopathy CARDIAC: RRR, no murmurs, rubs, gallops RESPIRATORY:  Clear to auscultation without rales, wheezing or rhonchi  ABDOMEN: Soft, non-tender, non-distended MUSCULOSKELETAL:  No edema; No deformity  SKIN: Warm and dry NEUROLOGIC:  Alert and oriented x 3 PSYCHIATRIC:  Normal affect    Signed, [name], MD  12/19/2018 10:37 AM    [place]

## 2018-12-25 DIAGNOSIS — K219 Gastro-esophageal reflux disease without esophagitis: Secondary | ICD-10-CM | POA: Diagnosis not present

## 2018-12-25 DIAGNOSIS — E785 Hyperlipidemia, unspecified: Secondary | ICD-10-CM | POA: Diagnosis not present

## 2018-12-25 DIAGNOSIS — E039 Hypothyroidism, unspecified: Secondary | ICD-10-CM | POA: Diagnosis not present

## 2018-12-25 DIAGNOSIS — F3341 Major depressive disorder, recurrent, in partial remission: Secondary | ICD-10-CM | POA: Diagnosis not present

## 2018-12-25 DIAGNOSIS — E291 Testicular hypofunction: Secondary | ICD-10-CM | POA: Diagnosis not present

## 2018-12-25 DIAGNOSIS — I10 Essential (primary) hypertension: Secondary | ICD-10-CM | POA: Diagnosis not present

## 2018-12-25 DIAGNOSIS — I2510 Atherosclerotic heart disease of native coronary artery without angina pectoris: Secondary | ICD-10-CM | POA: Diagnosis not present

## 2018-12-25 DIAGNOSIS — E118 Type 2 diabetes mellitus with unspecified complications: Secondary | ICD-10-CM | POA: Diagnosis not present

## 2019-01-21 ENCOUNTER — Telehealth: Payer: Self-pay | Admitting: *Deleted

## 2019-01-21 NOTE — Telephone Encounter (Signed)
Patient given detailed instructions per Myocardial Perfusion Study Information Sheet for the test on 01/26/2018 at 1130. Patient notified to arrive 15 minutes early and that it is imperative to arrive on time for appointment to keep from having the test rescheduled.  If you need to cancel or reschedule your appointment, please call the office within 24 hours of your appointment. . Patient verbalized understanding.Robert Santiago, [place] Patient does not use his my chart.

## 2019-01-27 ENCOUNTER — Other Ambulatory Visit: Payer: Self-pay

## 2019-01-27 ENCOUNTER — Ambulatory Visit (INDEPENDENT_AMBULATORY_CARE_PROVIDER_SITE_OTHER): Payer: Medicare HMO

## 2019-01-27 VITALS — Ht 72.0 in | Wt 282.0 lb

## 2019-01-27 DIAGNOSIS — I25118 Atherosclerotic heart disease of native coronary artery with other forms of angina pectoris: Secondary | ICD-10-CM | POA: Diagnosis not present

## 2019-01-27 DIAGNOSIS — R0602 Shortness of breath: Secondary | ICD-10-CM | POA: Diagnosis not present

## 2019-01-27 MED ORDER — TECHNETIUM TC 99M TETROFOSMIN IV KIT
31.3000 | PACK | Freq: Once | INTRAVENOUS | Status: AC | PRN
  Administered 2019-01-27: 31.3 via INTRAVENOUS

## 2019-01-27 MED ORDER — REGADENOSON 0.4 MG/5ML IV SOLN
0.4000 mg | Freq: Once | INTRAVENOUS | Status: AC
  Administered 2019-01-27: 0.4 mg via INTRAVENOUS

## 2019-01-28 ENCOUNTER — Ambulatory Visit: Payer: Medicare HMO

## 2019-01-28 ENCOUNTER — Telehealth: Payer: Self-pay | Admitting: *Deleted

## 2019-01-28 LAB — MYOCARDIAL PERFUSION IMAGING
LV dias vol: 106 mL (ref 62–150)
LV sys vol: 49 mL
Peak HR: 82 {beats}/min
Rest HR: 65 {beats}/min
SDS: 11
SRS: 2
SSS: 13
TID: 0.81

## 2019-01-28 MED ORDER — TECHNETIUM TC 99M TETROFOSMIN IV KIT
31.5000 | PACK | Freq: Once | INTRAVENOUS | Status: AC | PRN
  Administered 2019-01-28: 31.5 via INTRAVENOUS

## 2019-01-28 NOTE — Telephone Encounter (Signed)
Pt has been notified of Myoview results by phone with verbal understanding. Advised pt of recommendations for sooner appt in the [place] office per Dr. Bettina Gavia. Pt has been scheduled for Friday 01/30/19 @ 8:40 with Dr. Loney Hering in the [facility] office to set up for possible cath. Pt thanked me for the call. The patient has been notified of the result and verbalized understanding. All questions (if any) were answered. Julaine Hua, [facility] 01/28/2019 4:15 PM

## 2019-01-28 NOTE — Telephone Encounter (Signed)
-----   Message from Richardo Priest, MD sent at 01/28/2019  2:38 PM EST ----- His test is abnormal he has evidence of ischemia I think he would benefit from a coronary angiogram he has an appointment next week and if he wants to move it up I can see him in the [facility] office on Friday with openings

## 2019-01-29 NOTE — H&P (View-Only) (Signed)
Cardiology Office Note:    Date:  01/30/2019   ID:  Robert Santiago, DOB 1950/05/17, MRN [id number]  PCP:  [name], MD  Cardiologist:  [name], MD    Referring MD: [name], MD    ASSESSMENT:    1. Coronary artery disease of native artery of native heart with stable angina pectoris ([place])   2. Essential hypertension   3. Mixed hyperlipidemia   4. Type 2 diabetes mellitus without complication, without long-term current use of insulin (HCC)    PLAN:    In order of problems listed above:  1. CAD he is having exertional symptoms disrupting his life previous bypass surgery objective evidence of ischemia on perfusion study undergo left heart catheterization and PCI if appropriate.  He is [place] [employer] last 2 to class III and will continue his current medical treatment with the addition of Zetia to achieve LDL goals. 2. Stable hypertension continue current treatment 3. Will intensify lipid-lowering therapy act adding Zetia to low intensity statin he is not tolerant of the high intensity preparations 4. Stable managed by his PCP his last hemoglobin A1c 12/25/2018 remain above target at 9.8%.   Next appointment: With me 4 weeks   Medication Adjustments/Labs and Tests Ordered: Current medicines are reviewed at length with the patient today.  Concerns regarding medicines are outlined above.  No orders of the defined types were placed in this encounter.  No orders of the defined types were placed in this encounter.   Chief Complaint  Patient presents with  . Follow-up    Discuss Myoview   . Coronary Artery Disease    History of Present Illness:    Robert Santiago is a 69 y.o. male with a hx of coronary artery disease coronary artery bypass surgery 08/23/2014 with a left thoracic artery anastomosed to the left anterior descending vein graft to the first diagonal vein graft to the ramus and vein graft to an obtuse marginal and vein graft to the right sided  posterior lateral branch.  Other problems include hypertension hyperlipidemia and superficial thrombophlebitis of lower extremity.  He was last seen 12/19/2018.  He was having typical exertional anginal equivalent dyspnea and underwent myocardial perfusion study for risk stratification.  The study was reported as abnormal asked the patient come to the office and review the results with me in particular regarding need for angiography and further revascularization..  With his cardiac medications on a good regimen including his diabetic meds aspirin beta-blocker ACE inhibitor and lipid-lowering treatment with a statin. Compliance with diet, lifestyle and medications: Yes  I reviewed his myocardial perfusion study with the patient and his wife.  He has exertional angina equivalent shortness of breath which is new as evidence of significant ischemia in the distribution of left circumflex coronary artery.  Recently had COVID-19 in August and may well been recent related to the onset of his symptoms.  Is not needed to take nitroglycerin no edema orthopnea chest pain palpitation or syncope.  His residual LDL is greater than 70 and will add Zetia to achieve goals of less than 70 ideally less than 50 and if he cannot do that would benefit from PCSK9.  Options benefits and risks detailed with the patient and his wife he has no dye allergy or contraindication to dual antiplatelet therapy and is compliant with follow-up and medical treatment.  Left heart cath 08/20/2014: Diagnostic Procedure Summary 1. Severe, diffuse multi-vessel CAD 2. Normal LV function Diagnostic Procedure Recommendations CTS consult.  Angiographic findings  Cardiac Arteries and Lesion Findings LMCA: Normal. LAD:  Lesion on Prox LAD: 80% stenosis.  Lesion on Dist LAD: 80% stenosis. LCx:  Lesion on Prox CX: Proximal subsection.80% stenosis.  Lesion on 1st Ob Marg: 80% stenosis. RCA:  Lesion on Prox RCA: 95% stenosis.  Lesion on Mid  RCA: Proximal subsection.95% stenosis.  Lesion on Mid RCA: Distal subsection.95% stenosis. Procedure Data Procedure Date Date: 08/20/2014 Start: 17:30 Contrast Material  - Omnipaque90 ml Fluoroscopy Time: Diagnostic: 2:01 minutes. Total: 2:01 minutes. Admission Data Admission Date: 08/20/2014 Admission Status: Emergency department.  Coronary Tree  Dominance: Right VA LV function assessed [id number]. Ejection Fraction  - Method: LV gram. EF%: 65.  MPI 01/28/2019: Nuclear Stress Findings  Isotope administration Rest isotope was administered  with an IV injection of 31.3 mCi Tc46m Tetrofosmin.  Rest SPECT images were obtained approximately 45 minutes post tracer injection.  Stress isotope was administered  with an IV injection of 31.5 mCi Tc61m Tetrofosmin   Stress SPECT images were obtained approximately 60 minutes post tracer injection.  Nuclear Measurements Study was gated.  Rest Perfusion Rest perfusion normal.  Stress Perfusion Stress perfusion abnormal.   There is a defect present in the basal inferolateral and mid inferolateral location.  Perfusion Summary Defect 1:  There is a medium defect of moderate severity present in the basal inferolateral and mid inferolateral location. The defect is reversible.  Overall Study Impression Myocardial perfusion is abnormal.  Findings consistent with ischemia.  This is an intermediate risk study.  Overall left ventricular systolic function was normal.    LV cavity size is normal.  Nuclear stress EF:  54%.  The left ventricular ejection fraction is mildly decreased (45-54%).      Past Medical History:  Diagnosis Date  . Coronary artery disease   . COVID-19 08/2018  . Diabetes mellitus without complication ([place])   . Hyperlipidemia   . Hypertension     Past Surgical History:  Procedure Laterality Date  . ACOUSTIC NEUROMA RESECTION    . APPENDECTOMY    . CARDIAC CATHETERIZATION    . CORONARY ARTERY BYPASS GRAFT      Current  Medications: Current Meds  Medication Sig  . ALPRAZolam (XANAX) 0.25 MG tablet Take 1 tablet by mouth as needed.  Marland Kitchen aspirin EC 81 MG tablet Take 81 mg by mouth daily.  . benazepril (LOTENSIN) 20 MG tablet Take 20 mg by mouth daily.  . citalopram (CELEXA) 40 MG tablet Take 40 mg by mouth daily.  Marland Kitchen glimepiride (AMARYL) 4 MG tablet Take 1 tablet by mouth 2 (two) times daily.  Marland Kitchen JANUVIA 100 MG tablet Take 1 tablet by mouth daily.  Marland Kitchen levothyroxine (SYNTHROID) 175 MCG tablet Take 1 tablet by mouth daily.  . metFORMIN (GLUCOPHAGE) 850 MG tablet Take 1 tablet by mouth 2 (two) times daily.  . metoprolol tartrate (LOPRESSOR) 25 MG tablet Take 1 tablet by mouth 2 (two) times daily.  . nitroGLYCERIN (NITROSTAT) 0.4 MG SL tablet Place 1 tablet (0.4 mg total) under the tongue every 5 (five) minutes as needed for chest pain.  Marland Kitchen omeprazole (PRILOSEC) 40 MG capsule Take 40 mg by mouth daily.  . pravastatin (PRAVACHOL) 40 MG tablet Take 2 tablets by mouth daily.     Allergies:   Patient has no known allergies.   Social History   Socioeconomic History  . Marital status: Married    Spouse name: Not on file  . Number of children: Not on file  . Years  of education: Not on file  . Highest education level: Not on file  Occupational History  . Not on file  Tobacco Use  . Smoking status: Former Smoker    Packs/day: 1.00    Years: 30.00    Pack years: 30.00    Types: Cigarettes    Quit date: 2003    Years since quitting: 18.0  . Smokeless tobacco: Never Used  Substance and Sexual Activity  . Alcohol use: Yes    Comment: occ  . Drug use: Never  . Sexual activity: Not on file  Other Topics Concern  . Not on file  Social History Narrative  . Not on file   Social Determinants of Health   Financial Resource Strain:   . Difficulty of Paying Living Expenses: Not on file  Food Insecurity:   . Worried About Charity fundraiser in the Last Year: Not on file  . Ran Out of Food in the Last Year: Not  on file  Transportation Needs:   . Lack of Transportation (Medical): Not on file  . Lack of Transportation (Non-Medical): Not on file  Physical Activity:   . Days of Exercise per Week: Not on file  . Minutes of Exercise per Session: Not on file  Stress:   . Feeling of Stress : Not on file  Social Connections:   . Frequency of Communication with Friends and Family: Not on file  . Frequency of Social Gatherings with Friends and Family: Not on file  . Attends Religious Services: Not on file  . Active Member of Clubs or Organizations: Not on file  . Attends Archivist Meetings: Not on file  . Marital Status: Not on file     Family History: The patient's family history includes COPD in his mother; Heart attack in his brother; Heart disease in his brother, brother, brother, and father; Heart failure in his mother; Hyperlipidemia in his father and mother; Hypertension in his father and mother. ROS:   Please see the history of present illness.    All other systems reviewed and are negative.  EKGs/Labs/Other Studies Reviewed:    The following studies were reviewed today:  EKG:  EKG 12/19/2018 sinus rhythm right bundle branch block occasional PVCs  Recent Labs: 09/26/2018 cholesterol 169 HDL 42 LDL 90 A1c 9.2% creatinine 0.81 TSH normal  Physical Exam:    VS:  BP 132/76   Pulse 64   Ht 6' (1.829 m)   Wt 282 lb (127.9 kg)   SpO2 99%   BMI 38.25 kg/m     Wt Readings from Last 3 Encounters:  01/30/19 282 lb (127.9 kg)  01/27/19 282 lb (127.9 kg)  12/19/18 282 lb (127.9 kg)     GEN: Obese well nourished, well developed in no acute distress HEENT: Normal NECK: No JVD; No carotid bruits LYMPHATICS: No lymphadenopathy CARDIAC: RRR, no murmurs, rubs, gallops RESPIRATORY:  Clear to auscultation without rales, wheezing or rhonchi  ABDOMEN: Soft, non-tender, non-distended MUSCULOSKELETAL:  No edema; No deformity  SKIN: Warm and dry NEUROLOGIC:  Alert and oriented x 3  PSYCHIATRIC:  Normal affect    Signed, [name], MD  01/30/2019 9:03 AM    [place]

## 2019-01-29 NOTE — Progress Notes (Signed)
Cardiology Office Note:    Date:  01/30/2019   ID:  [name], DOB [birth year], MRN [id number]  PCP:  [name], MD  Cardiologist:  [name], MD    Referring MD: [name], MD    ASSESSMENT:    1. Coronary artery disease of native artery of native heart with stable angina pectoris ([place])   2. Essential hypertension   3. Mixed hyperlipidemia   4. Type 2 diabetes mellitus without complication, without long-term current use of insulin (HCC)    PLAN:    In order of problems listed above:  1. CAD he is having exertional symptoms disrupting his life previous bypass surgery objective evidence of ischemia on perfusion study undergo left heart catheterization and PCI if appropriate.  He is [place] [employer] last 2 to class III and will continue his current medical treatment with the addition of Zetia to achieve LDL goals. 2. Stable hypertension continue current treatment 3. Will intensify lipid-lowering therapy act adding Zetia to low intensity statin he is not tolerant of the high intensity preparations 4. Stable managed by his PCP his last hemoglobin A1c 12/25/2018 remain above target at 9.8%.   Next appointment: With me 4 weeks   Medication Adjustments/Labs and Tests Ordered: Current medicines are reviewed at length with the patient today.  Concerns regarding medicines are outlined above.  No orders of the defined types were placed in this encounter.  No orders of the defined types were placed in this encounter.   Chief Complaint  Patient presents with  . Follow-up    Discuss Myoview   . Coronary Artery Disease    History of Present Illness:    Wali Reinheimer is a 69 y.o. male with a hx of coronary artery disease coronary artery bypass surgery 08/23/2014 with a left thoracic artery anastomosed to the left anterior descending vein graft to the first diagonal vein graft to the ramus and vein graft to an obtuse marginal and vein graft to the right sided  posterior lateral branch.  Other problems include hypertension hyperlipidemia and superficial thrombophlebitis of lower extremity.  He was last seen 12/19/2018.  He was having typical exertional anginal equivalent dyspnea and underwent myocardial perfusion study for risk stratification.  The study was reported as abnormal asked the patient come to the office and review the results with me in particular regarding need for angiography and further revascularization..  With his cardiac medications on a good regimen including his diabetic meds aspirin beta-blocker ACE inhibitor and lipid-lowering treatment with a statin. Compliance with diet, lifestyle and medications: Yes  I reviewed his myocardial perfusion study with the patient and his wife.  He has exertional angina equivalent shortness of breath which is new as evidence of significant ischemia in the distribution of left circumflex coronary artery.  Recently had COVID-19 in August and may well been recent related to the onset of his symptoms.  Is not needed to take nitroglycerin no edema orthopnea chest pain palpitation or syncope.  His residual LDL is greater than 70 and will add Zetia to achieve goals of less than 70 ideally less than 50 and if he cannot do that would benefit from PCSK9.  Options benefits and risks detailed with the patient and his wife he has no dye allergy or contraindication to dual antiplatelet therapy and is compliant with follow-up and medical treatment.  Left heart cath 08/20/2014: Diagnostic Procedure Summary 1. Severe, diffuse multi-vessel CAD 2. Normal LV function Diagnostic Procedure Recommendations CTS consult.  Angiographic findings  Cardiac Arteries and Lesion Findings LMCA: Normal. LAD:  Lesion on Prox LAD: 80% stenosis.  Lesion on Dist LAD: 80% stenosis. LCx:  Lesion on Prox CX: Proximal subsection.80% stenosis.  Lesion on 1st Ob Marg: 80% stenosis. RCA:  Lesion on Prox RCA: 95% stenosis.  Lesion on Mid  RCA: Proximal subsection.95% stenosis.  Lesion on Mid RCA: Distal subsection.95% stenosis. Procedure Data Procedure Date Date: 08/20/2014 Start: 17:30 Contrast Material  - Omnipaque90 ml Fluoroscopy Time: Diagnostic: 2:01 minutes. Total: 2:01 minutes. Admission Data Admission Date: 08/20/2014 Admission Status: Emergency department.  Coronary Tree  Dominance: Right VA LV function assessed IR:[id number]. Ejection Fraction  - Method: LV gram. EF%: 65.  MPI 01/28/2019: Nuclear Stress Findings  Isotope administration Rest isotope was administered  with an IV injection of 31.3 mCi Tc50m Tetrofosmin.  Rest SPECT images were obtained approximately 45 minutes post tracer injection.  Stress isotope was administered  with an IV injection of 31.5 mCi Tc76m Tetrofosmin   Stress SPECT images were obtained approximately 60 minutes post tracer injection.  Nuclear Measurements Study was gated.  Rest Perfusion Rest perfusion normal.  Stress Perfusion Stress perfusion abnormal.   There is a defect present in the basal inferolateral and mid inferolateral location.  Perfusion Summary Defect 1:  There is a medium defect of moderate severity present in the basal inferolateral and mid inferolateral location. The defect is reversible.  Overall Study Impression Myocardial perfusion is abnormal.  Findings consistent with ischemia.  This is an intermediate risk study.  Overall left ventricular systolic function was normal.    LV cavity size is normal.  Nuclear stress EF:  54%.  The left ventricular ejection fraction is mildly decreased (45-54%).      Past Medical History:  Diagnosis Date  . Coronary artery disease   . COVID-19 08/2018  . Diabetes mellitus without complication ([place])   . Hyperlipidemia   . Hypertension     Past Surgical History:  Procedure Laterality Date  . ACOUSTIC NEUROMA RESECTION    . APPENDECTOMY    . CARDIAC CATHETERIZATION    . CORONARY ARTERY BYPASS GRAFT      Current  Medications: Current Meds  Medication Sig  . ALPRAZolam (XANAX) 0.25 MG tablet Take 1 tablet by mouth as needed.  Marland Kitchen aspirin EC 81 MG tablet Take 81 mg by mouth daily.  . benazepril (LOTENSIN) 20 MG tablet Take 20 mg by mouth daily.  . citalopram (CELEXA) 40 MG tablet Take 40 mg by mouth daily.  Marland Kitchen glimepiride (AMARYL) 4 MG tablet Take 1 tablet by mouth 2 (two) times daily.  Marland Kitchen JANUVIA 100 MG tablet Take 1 tablet by mouth daily.  Marland Kitchen levothyroxine (SYNTHROID) 175 MCG tablet Take 1 tablet by mouth daily.  . metFORMIN (GLUCOPHAGE) 850 MG tablet Take 1 tablet by mouth 2 (two) times daily.  . metoprolol tartrate (LOPRESSOR) 25 MG tablet Take 1 tablet by mouth 2 (two) times daily.  . nitroGLYCERIN (NITROSTAT) 0.4 MG SL tablet Place 1 tablet (0.4 mg total) under the tongue every 5 (five) minutes as needed for chest pain.  Marland Kitchen omeprazole (PRILOSEC) 40 MG capsule Take 40 mg by mouth daily.  . pravastatin (PRAVACHOL) 40 MG tablet Take 2 tablets by mouth daily.     Allergies:   Patient has no known allergies.   Social History   Socioeconomic History  . Marital status: Married    Spouse name: Not on file  . Number of children: Not on file  . Years  of education: Not on file  . Highest education level: Not on file  Occupational History  . Not on file  Tobacco Use  . Smoking status: Former Smoker    Packs/day: 1.00    Years: 30.00    Pack years: 30.00    Types: Cigarettes    Quit date: 2003    Years since quitting: 18.0  . Smokeless tobacco: Never Used  Substance and Sexual Activity  . Alcohol use: Yes    Comment: occ  . Drug use: Never  . Sexual activity: Not on file  Other Topics Concern  . Not on file  Social History Narrative  . Not on file   Social Determinants of Health   Financial Resource Strain:   . Difficulty of Paying Living Expenses: Not on file  Food Insecurity:   . Worried About Charity fundraiser in the Last Year: Not on file  . Ran Out of Food in the Last Year: Not  on file  Transportation Needs:   . Lack of Transportation (Medical): Not on file  . Lack of Transportation (Non-Medical): Not on file  Physical Activity:   . Days of Exercise per Week: Not on file  . Minutes of Exercise per Session: Not on file  Stress:   . Feeling of Stress : Not on file  Social Connections:   . Frequency of Communication with Friends and Family: Not on file  . Frequency of Social Gatherings with Friends and Family: Not on file  . Attends Religious Services: Not on file  . Active Member of Clubs or Organizations: Not on file  . Attends Archivist Meetings: Not on file  . Marital Status: Not on file     Family History: The patient's family history includes COPD in his mother; Heart attack in his brother; Heart disease in his brother, brother, brother, and father; Heart failure in his mother; Hyperlipidemia in his father and mother; Hypertension in his father and mother. ROS:   Please see the history of present illness.    All other systems reviewed and are negative.  EKGs/Labs/Other Studies Reviewed:    The following studies were reviewed today:  EKG:  EKG 12/19/2018 sinus rhythm right bundle branch block occasional PVCs  Recent Labs: 09/26/2018 cholesterol 169 HDL 42 LDL 90 A1c 9.2% creatinine 0.81 TSH normal  Physical Exam:    VS:  BP 132/76   Pulse 64   Ht 6' (1.829 m)   Wt 282 lb (127.9 kg)   SpO2 99%   BMI 38.25 kg/m     Wt Readings from Last 3 Encounters:  01/30/19 282 lb (127.9 kg)  01/27/19 282 lb (127.9 kg)  12/19/18 282 lb (127.9 kg)     GEN: Obese well nourished, well developed in no acute distress HEENT: Normal NECK: No JVD; No carotid bruits LYMPHATICS: No lymphadenopathy CARDIAC: RRR, no murmurs, rubs, gallops RESPIRATORY:  Clear to auscultation without rales, wheezing or rhonchi  ABDOMEN: Soft, non-tender, non-distended MUSCULOSKELETAL:  No edema; No deformity  SKIN: Warm and dry NEUROLOGIC:  Alert and oriented x  3 PSYCHIATRIC:  Normal affect    Signed, [name], MD  01/30/2019 9:03 AM    [place]

## 2019-01-30 ENCOUNTER — Other Ambulatory Visit: Payer: Self-pay

## 2019-01-30 ENCOUNTER — Ambulatory Visit (INDEPENDENT_AMBULATORY_CARE_PROVIDER_SITE_OTHER): Payer: Medicare HMO | Admitting: Cardiology

## 2019-01-30 ENCOUNTER — Encounter: Payer: Self-pay | Admitting: Cardiology

## 2019-01-30 VITALS — BP 132/76 | HR 64 | Ht 72.0 in | Wt 282.0 lb

## 2019-01-30 DIAGNOSIS — I10 Essential (primary) hypertension: Secondary | ICD-10-CM

## 2019-01-30 DIAGNOSIS — I25118 Atherosclerotic heart disease of native coronary artery with other forms of angina pectoris: Secondary | ICD-10-CM | POA: Diagnosis not present

## 2019-01-30 DIAGNOSIS — E119 Type 2 diabetes mellitus without complications: Secondary | ICD-10-CM | POA: Diagnosis not present

## 2019-01-30 DIAGNOSIS — E782 Mixed hyperlipidemia: Secondary | ICD-10-CM | POA: Diagnosis not present

## 2019-01-30 MED ORDER — EZETIMIBE 10 MG PO TABS: 10.0000 mg | ORAL_TABLET | Freq: Every day | ORAL | 3 refills | Status: DC

## 2019-01-30 NOTE — Patient Instructions (Addendum)
Medication Instructions:  Zetia 10mg  daily was sent to your pharmacy  *If you need a refill on your cardiac medications before your next appointment, please call your pharmacy*  Lab Work: none If you have labs (blood work) drawn today and your tests are completely normal, you will receive your results only by: Marland Kitchen MyChart Message (if you have MyChart) OR . A paper copy in the mail If you have any lab test that is abnormal or we need to change your treatment, we will call you to review the results.  Testing/Procedures: Cath  Follow-Up: At [facility], you and your health needs are our priority.  As part of our continuing mission to provide you with exceptional heart care, we have created designated Provider Care Teams.  These Care Teams include your primary Cardiologist (physician) and Advanced Practice Providers (APPs -  Physician Assistants and Nurse Practitioners) who all work together to provide you with the care you need, when you need it.  Your next appointment:   4 week(s)  The format for your next appointment:   Either In Person or Virtual  Provider:   [name], MD  Other Instructions  02/09/2019 arrive at Short Stay at 57am  Your physician has requested that you have a cardiac catheterization. Cardiac catheterization is used to diagnose and/or treat various heart conditions. Doctors may recommend this procedure for a number of different reasons. The most common reason is to evaluate chest pain. Chest pain can be a symptom of coronary artery disease (CAD), and cardiac catheterization can show whether plaque is narrowing or blocking your heart's arteries. This procedure is also used to evaluate the valves, as well as measure the blood flow and oxygen levels in different parts of your heart. For further information please visit [email or web address]. Please follow instruction sheet, as given.     [facility]  [place], [place] [address] Dept: [phone] Loc: [phone]  Kin Galbraith  01/30/2019  You are scheduled for a Cardiac Catheterization on Monday, January 25 with Dr. Glenetta Hew.  1. Please arrive at the [facility] (Main Entrance A) at [facility]: [address] [place], [place] [address] at 5:30 AM (This time is two hours before your procedure to ensure your preparation). Free valet parking service is available.   Special note: Every effort is made to have your procedure done on time. Please understand that emergencies sometimes delay scheduled procedures.  2. Diet: Do not eat solid foods after midnight.  The patient may have clear liquids until 5am upon the day of the procedure.  3. Labs: You will have labs today (CMP).  4. Medication instructions in preparation for your procedure:   Contrast Allergy: No   Current Outpatient Medications (Endocrine & Metabolic):  .  glimepiride (AMARYL) 4 MG tablet, Take 1 tablet by mouth 2 (two) times daily. Marland Kitchen  JANUVIA 100 MG tablet, Take 1 tablet by mouth daily. Marland Kitchen  levothyroxine (SYNTHROID) 175 MCG tablet, Take 1 tablet by mouth daily. .  metFORMIN (GLUCOPHAGE) 850 MG tablet, Take 1 tablet by mouth 2 (two) times daily.  Current Outpatient Medications (Cardiovascular):  .  benazepril (LOTENSIN) 20 MG tablet, Take 20 mg by mouth daily. .  metoprolol tartrate (LOPRESSOR) 25 MG tablet, Take 1 tablet by mouth 2 (two) times daily. .  nitroGLYCERIN (NITROSTAT) 0.4 MG SL tablet, Place 1 tablet (0.4 mg total) under the tongue every 5 (five) minutes  as needed for chest pain. .  pravastatin (PRAVACHOL) 40 MG tablet, Take 2 tablets by mouth daily. Marland Kitchen  ezetimibe (ZETIA) 10 MG tablet, Take 1 tablet (10 mg total) by mouth daily.   Current Outpatient Medications (Analgesics):  .  aspirin EC 81 MG tablet, Take 81 mg by mouth daily.   Current Outpatient Medications (Other):  Marland Kitchen  ALPRAZolam (XANAX) 0.25  MG tablet, Take 1 tablet by mouth as needed. .  citalopram (CELEXA) 40 MG tablet, Take 40 mg by mouth daily. Marland Kitchen  omeprazole (PRILOSEC) 40 MG capsule, Take 40 mg by mouth daily. *For reference purposes while preparing patient instructions.   Delete this med list prior to printing instructions for patient.*   On the morning of your procedure, take your Aspirin and any morning medicines NOT listed above.  You may use sips of water.  5. Plan for one night stay--bring personal belongings. 6. Bring a current list of your medications and current insurance cards. 7. You MUST have a responsible person to drive you home. 8. Someone MUST be with you the first 24 hours after you arrive home or your discharge will be delayed. 9. Please wear clothes that are easy to get on and off and wear slip-on shoes.  Thank you for allowing [place] to care for you!   -- [facility]

## 2019-01-31 LAB — BASIC METABOLIC PANEL
BUN/Creatinine Ratio: 17 (ref 10–24)
BUN: 16 mg/dL (ref 8–27)
CO2: 22 mmol/L (ref 20–29)
Calcium: 9.1 mg/dL (ref 8.6–10.2)
Chloride: 100 mmol/L (ref 96–106)
Creatinine, Ser: 0.96 mg/dL (ref 0.76–1.27)
GFR calc Af Amer: 94 mL/min/{1.73_m2} (ref >59)
GFR calc non Af Amer: 81 mL/min/{1.73_m2} (ref >59)
Glucose: 271 mg/dL — ABNORMAL HIGH (ref 65–99)
Potassium: 4.6 mmol/L (ref 3.5–5.2)
Sodium: 136 mmol/L (ref 134–144)

## 2019-01-31 LAB — CBC
Hematocrit: 46 % (ref 37.5–51.0)
Hemoglobin: 15.4 g/dL (ref 13.0–17.7)
MCH: 30.7 pg (ref 26.6–33.0)
MCHC: 33.5 g/dL (ref 31.5–35.7)
MCV: 92 fL (ref 79–97)
Platelets: 230 10*3/uL (ref 150–450)
RBC: 5.02 x10E6/uL (ref 4.14–5.80)
RDW: 12.5 % (ref 11.6–15.4)
WBC: 9.5 10*3/uL (ref 3.4–10.8)

## 2019-02-02 ENCOUNTER — Telehealth: Payer: Self-pay | Admitting: Emergency Medicine

## 2019-02-02 NOTE — Telephone Encounter (Signed)
Left lab results on voicemail per dpr. Advised patient to return call regarding results with any questions.

## 2019-02-04 ENCOUNTER — Ambulatory Visit: Payer: Medicare HMO | Admitting: Cardiology

## 2019-02-05 ENCOUNTER — Telehealth: Payer: Self-pay | Admitting: *Deleted

## 2019-02-05 NOTE — Telephone Encounter (Addendum)
Pt contacted pre-catheterization scheduled at [facility] for: Monday February 09, 2019 7:30 AM Verified arrival time and place: [facility]) at: 5:30 AM   No solid food after midnight prior to cath, clear liquids until 5 AM day of procedure. Contrast allergy: no  Hold: Januvia-AM of procedure Glimepiride-AM of procedure Metformin-day of procedure and 48 hours post procedure.  Except hold medications AM meds can be  taken pre-cath with sip of water including: ASA 81 mg   Confirmed patient has responsible adult to drive home post procedure and observe 24 hours after arriving home: yes  Currently, due to Covid-19 pandemic, only one person will be allowed with patient. Must be the same person for patient's entire stay and will be required to wear a mask. They will be asked to wait in the waiting room for the duration of the patient's stay.  Patients are required to wear a mask when they enter the hospital.         COVID-19 Pre-Screening Questions:  . In the past 7 to 10 days have you had a cough,  shortness of breath, headache, congestion, fever (100 or greater) body aches, chills, sore throat, or sudden loss of taste or sense of smell? Pt has chronic sinus problems/denies fever . Have you been around anyone who is awaiting Covid 19 test results in the past 7 to 10 days? Not to his knowledge . Have you been around anyone who has been exposed to Covid 19, or has mentioned symptoms of Covid 19 within the past 7 to 10 days? Not to his knowledge  Pt tested positive for Covid-03 September 2018, treated by his PCP Dr Braulio Bosch, result under Media tab

## 2019-02-06 ENCOUNTER — Other Ambulatory Visit (HOSPITAL_COMMUNITY)
Admission: RE | Admit: 2019-02-06 | Discharge: 2019-02-06 | Disposition: A | Discharge status: Home / Self Care | Payer: Medicare HMO | Source: Ambulatory Visit | Attending: Cardiology | Admitting: Cardiology

## 2019-02-06 DIAGNOSIS — Z20822 Contact with and (suspected) exposure to covid-19: Secondary | ICD-10-CM | POA: Diagnosis not present

## 2019-02-06 DIAGNOSIS — Z01812 Encounter for preprocedural laboratory examination: Secondary | ICD-10-CM | POA: Insufficient documentation

## 2019-02-06 LAB — SARS CORONAVIRUS 2 (TAT 6-24 HRS): SARS Coronavirus 2: NEGATIVE

## 2019-02-07 ENCOUNTER — Encounter (HOSPITAL_COMMUNITY): Payer: Self-pay | Admitting: Cardiology

## 2019-02-07 DIAGNOSIS — E785 Hyperlipidemia, unspecified: Secondary | ICD-10-CM | POA: Diagnosis present

## 2019-02-07 DIAGNOSIS — I10 Essential (primary) hypertension: Secondary | ICD-10-CM | POA: Diagnosis present

## 2019-02-09 ENCOUNTER — Other Ambulatory Visit: Payer: Self-pay

## 2019-02-09 ENCOUNTER — Ambulatory Visit (HOSPITAL_COMMUNITY)
Admission: RE | Admit: 2019-02-09 | Discharge: 2019-02-09 | Disposition: A | Discharge status: Home / Self Care | Payer: Medicare HMO | Source: Ambulatory Visit | Attending: Cardiology | Admitting: Cardiology

## 2019-02-09 ENCOUNTER — Encounter (HOSPITAL_COMMUNITY)
Admission: RE | Disposition: A | Discharge status: Home / Self Care | Payer: Medicare HMO | Source: Ambulatory Visit | Attending: Cardiology

## 2019-02-09 DIAGNOSIS — I200 Unstable angina: Secondary | ICD-10-CM | POA: Diagnosis present

## 2019-02-09 DIAGNOSIS — Z87891 Personal history of nicotine dependence: Secondary | ICD-10-CM | POA: Diagnosis not present

## 2019-02-09 DIAGNOSIS — Z79899 Other long term (current) drug therapy: Secondary | ICD-10-CM | POA: Diagnosis not present

## 2019-02-09 DIAGNOSIS — Z8616 Personal history of COVID-19: Secondary | ICD-10-CM | POA: Diagnosis not present

## 2019-02-09 DIAGNOSIS — Z7982 Long term (current) use of aspirin: Secondary | ICD-10-CM | POA: Insufficient documentation

## 2019-02-09 DIAGNOSIS — I25710 Atherosclerosis of autologous vein coronary artery bypass graft(s) with unstable angina pectoris: Secondary | ICD-10-CM

## 2019-02-09 DIAGNOSIS — E782 Mixed hyperlipidemia: Secondary | ICD-10-CM | POA: Diagnosis not present

## 2019-02-09 DIAGNOSIS — I10 Essential (primary) hypertension: Secondary | ICD-10-CM | POA: Diagnosis not present

## 2019-02-09 DIAGNOSIS — E119 Type 2 diabetes mellitus without complications: Secondary | ICD-10-CM | POA: Insufficient documentation

## 2019-02-09 DIAGNOSIS — Z79890 Hormone replacement therapy (postmenopausal): Secondary | ICD-10-CM | POA: Diagnosis not present

## 2019-02-09 DIAGNOSIS — Z951 Presence of aortocoronary bypass graft: Secondary | ICD-10-CM | POA: Diagnosis not present

## 2019-02-09 DIAGNOSIS — I2584 Coronary atherosclerosis due to calcified coronary lesion: Secondary | ICD-10-CM | POA: Insufficient documentation

## 2019-02-09 DIAGNOSIS — Z7984 Long term (current) use of oral hypoglycemic drugs: Secondary | ICD-10-CM | POA: Insufficient documentation

## 2019-02-09 DIAGNOSIS — Z8249 Family history of ischemic heart disease and other diseases of the circulatory system: Secondary | ICD-10-CM | POA: Insufficient documentation

## 2019-02-09 DIAGNOSIS — I25110 Atherosclerotic heart disease of native coronary artery with unstable angina pectoris: Secondary | ICD-10-CM

## 2019-02-09 DIAGNOSIS — I25118 Atherosclerotic heart disease of native coronary artery with other forms of angina pectoris: Secondary | ICD-10-CM

## 2019-02-09 DIAGNOSIS — I2510 Atherosclerotic heart disease of native coronary artery without angina pectoris: Secondary | ICD-10-CM | POA: Diagnosis present

## 2019-02-09 DIAGNOSIS — E785 Hyperlipidemia, unspecified: Secondary | ICD-10-CM | POA: Diagnosis present

## 2019-02-09 HISTORY — PX: LEFT HEART CATH AND CORS/GRAFTS ANGIOGRAPHY: CATH118250

## 2019-02-09 HISTORY — DX: Essential (primary) hypertension: I10

## 2019-02-09 LAB — GLUCOSE, CAPILLARY
Glucose-Capillary: 309 mg/dL — ABNORMAL HIGH (ref 70–99)
Glucose-Capillary: 259 mg/dL — ABNORMAL HIGH (ref 70–99)

## 2019-02-09 SURGERY — LEFT HEART CATH AND CORS/GRAFTS ANGIOGRAPHY
Anesthesia: LOCAL

## 2019-02-09 MED ORDER — SODIUM CHLORIDE 0.9% FLUSH: 3.0000 mL | Freq: Two times a day (BID) | INTRAVENOUS | Status: DC

## 2019-02-09 MED ORDER — VERAPAMIL HCL 2.5 MG/ML IV SOLN
INTRAVENOUS | Status: AC
  Filled 2019-02-09: qty 2

## 2019-02-09 MED ORDER — LIDOCAINE HCL (PF) 1 % IJ SOLN
INTRAMUSCULAR | Status: DC | PRN
  Administered 2019-02-09: 3 mL

## 2019-02-09 MED ORDER — SODIUM CHLORIDE 0.9% FLUSH: 3.0000 mL | INTRAVENOUS | Status: DC | PRN

## 2019-02-09 MED ORDER — HEPARIN (PORCINE) IN NACL 1000-0.9 UT/500ML-% IV SOLN
INTRAVENOUS | Status: DC | PRN
  Administered 2019-02-09 (×2): 500 mL

## 2019-02-09 MED ORDER — HEPARIN SODIUM (PORCINE) 1000 UNIT/ML IJ SOLN
INTRAMUSCULAR | Status: DC | PRN
  Administered 2019-02-09: 6000 [IU] via INTRAVENOUS

## 2019-02-09 MED ORDER — ASPIRIN 81 MG PO CHEW: 81.0000 mg | CHEWABLE_TABLET | ORAL | Status: DC

## 2019-02-09 MED ORDER — SODIUM CHLORIDE 0.9 % WEIGHT BASED INFUSION
3.0000 mL/kg/h | INTRAVENOUS | Status: AC
  Administered 2019-02-09: 06:00:00 3 mL/kg/h via INTRAVENOUS

## 2019-02-09 MED ORDER — FENTANYL CITRATE (PF) 100 MCG/2ML IJ SOLN
INTRAMUSCULAR | Status: DC | PRN
  Administered 2019-02-09: 25 ug via INTRAVENOUS

## 2019-02-09 MED ORDER — FENTANYL CITRATE (PF) 100 MCG/2ML IJ SOLN
INTRAMUSCULAR | Status: AC
  Filled 2019-02-09: qty 2

## 2019-02-09 MED ORDER — MIDAZOLAM HCL 2 MG/2ML IJ SOLN
INTRAMUSCULAR | Status: AC
  Filled 2019-02-09: qty 2

## 2019-02-09 MED ORDER — SODIUM CHLORIDE 0.9 % IV SOLN: 250.0000 mL | INTRAVENOUS | Status: DC | PRN

## 2019-02-09 MED ORDER — IOHEXOL 350 MG/ML SOLN
INTRAVENOUS | Status: DC | PRN
  Administered 2019-02-09: 210 mL

## 2019-02-09 MED ORDER — MIDAZOLAM HCL 2 MG/2ML IJ SOLN
INTRAMUSCULAR | Status: DC | PRN
  Administered 2019-02-09: 1 mg via INTRAVENOUS

## 2019-02-09 MED ORDER — LIDOCAINE HCL (PF) 1 % IJ SOLN
INTRAMUSCULAR | Status: AC
  Filled 2019-02-09: qty 30

## 2019-02-09 MED ORDER — SODIUM CHLORIDE 0.9 % WEIGHT BASED INFUSION: 1.0000 mL/kg/h | INTRAVENOUS | Status: DC

## 2019-02-09 MED ORDER — HEPARIN SODIUM (PORCINE) 1000 UNIT/ML IJ SOLN
INTRAMUSCULAR | Status: AC
  Filled 2019-02-09: qty 1

## 2019-02-09 MED ORDER — VERAPAMIL HCL 2.5 MG/ML IV SOLN
INTRAVENOUS | Status: DC | PRN
  Administered 2019-02-09: 10 mL via INTRA_ARTERIAL

## 2019-02-09 MED ORDER — HEPARIN (PORCINE) IN NACL 1000-0.9 UT/500ML-% IV SOLN
INTRAVENOUS | Status: AC
  Filled 2019-02-09: qty 1000

## 2019-02-09 SURGICAL SUPPLY — 12 items
CATH INFINITI 5FR AL1 (CATHETERS) ×2 IMPLANT
CATH INFINITI 5FR MULTPACK ANG (CATHETERS) ×1 IMPLANT
DEVICE RAD COMP TR BAND LRG (VASCULAR PRODUCTS) ×1 IMPLANT
GLIDESHEATH SLEND A-KIT 6F 22G (SHEATH) ×2 IMPLANT
GUIDEWIRE INQWIRE 1.5J.035X260 (WIRE) IMPLANT
INQWIRE 1.5J .035X260CM (WIRE) ×2
KIT HEART LEFT (KITS) ×2 IMPLANT
PACK CARDIAC CATHETERIZATION (CUSTOM PROCEDURE TRAY) ×2 IMPLANT
SHEATH PROBE COVER 6X72 (BAG) ×1 IMPLANT
SYR MEDRAD MARK 7 150ML (SYRINGE) ×2 IMPLANT
TRANSDUCER W/STOPCOCK (MISCELLANEOUS) ×2 IMPLANT
TUBING CIL FLEX 10 FLL-RA (TUBING) ×2 IMPLANT

## 2019-02-09 NOTE — Discharge Instructions (Signed)
Moderate Conscious Sedation, Adult, Care After These instructions provide you with information about caring for yourself after your procedure. Your health care provider may also give you more specific instructions. Your treatment has been planned according to current medical practices, but problems sometimes occur. Call your health care provider if you have any problems or questions after your procedure. What can I expect after the procedure? After your procedure, it is common:  To feel sleepy for several hours.  To feel clumsy and have poor balance for several hours.  To have poor judgment for several hours.  To vomit if you eat too soon. Follow these instructions at home: For at least 24 hours after the procedure:   Do not: ? Participate in activities where you could fall or become injured. ? Drive. ? Use heavy machinery. ? Drink alcohol. ? Take sleeping pills or medicines that cause drowsiness. ? Make important decisions or sign legal documents. ? Take care of children on your own.  Rest. Eating and drinking  Follow the diet recommended by your health care provider.  If you vomit: ? Drink water, juice, or soup when you can drink without vomiting. ? Make sure you have little or no nausea before eating solid foods. General instructions  Have a responsible adult stay with you until you are awake and alert.  Take over-the-counter and prescription medicines only as told by your health care provider.  If you smoke, do not smoke without supervision.  Keep all follow-up visits as told by your health care provider. This is important. Contact a health care provider if:  You keep feeling nauseous or you keep vomiting.  You feel light-headed.  You develop a rash.  You have a fever. Get help right away if:  You have trouble breathing. This information is not intended to replace advice given to you by your health care provider. Make sure you discuss any questions you have  with your health care provider. Document Revised: 12/14/2016 Document Reviewed: 04/23/2015 Elsevier Patient Education  2020 [employer] Radial Site Care  This sheet gives you information about how to care for yourself after your procedure. Your health care provider may also give you more specific instructions. If you have problems or questions, contact your health care provider. What can I expect after the procedure? After the procedure, it is common to have:  Bruising and tenderness at the catheter insertion area. Follow these instructions at home: Medicines  Take over-the-counter and prescription medicines only as told by your health care provider. Insertion site care  Follow instructions from your health care provider about how to take care of your insertion site. Make sure you: ? Wash your hands with soap and water before you change your bandage (dressing). If soap and water are not available, use hand sanitizer. ? Change your dressing as told by your health care provider. ? Leave stitches (sutures), skin glue, or adhesive strips in place. These skin closures may need to stay in place for 2 weeks or longer. If adhesive strip edges start to loosen and curl up, you may trim the loose edges. Do not remove adhesive strips completely unless your health care provider tells you to do that.  Check your insertion site every day for signs of infection. Check for: ? Redness, swelling, or pain. ? Fluid or blood. ? Pus or a bad smell. ? Warmth.  Do not take baths, swim, or use a hot tub until your health care provider approves.  You may shower 24-48 hours   after the procedure, or as directed by your health care provider. ? Remove the dressing and gently wash the site with plain soap and water. ? Pat the area dry with a clean towel. ? Do not rub the site. That could cause bleeding.  Do not apply powder or lotion to the site. Activity   For 24 hours after the procedure, or as directed by  your health care provider: ? Do not flex or bend the affected arm. ? Do not push or pull heavy objects with the affected arm. ? Do not drive yourself home from the hospital or clinic. You may drive 24 hours after the procedure unless your health care provider tells you not to. ? Do not operate machinery or power tools.  Do not lift anything that is heavier than 10 lb (4.5 kg), or the limit that you are told, until your health care provider says that it is safe.  Ask your health care provider when it is okay to: ? Return to work or school. ? Resume usual physical activities or sports. ? Resume sexual activity. General instructions  If the catheter site starts to bleed, raise your arm and put firm pressure on the site. If the bleeding does not stop, get help right away. This is a medical emergency.  If you went home on the same day as your procedure, a responsible adult should be with you for the first 24 hours after you arrive home.  Keep all follow-up visits as told by your health care provider. This is important. Contact a health care provider if:  You have a fever.  You have redness, swelling, or yellow drainage around your insertion site. Get help right away if:  You have unusual pain at the radial site.  The catheter insertion area swells very fast.  The insertion area is bleeding, and the bleeding does not stop when you hold steady pressure on the area.  Your arm or hand becomes pale, cool, tingly, or numb. These symptoms may represent a serious problem that is an emergency. Do not wait to see if the symptoms will go away. Get medical help right away. Call your local emergency services (911 in the [place] Do not drive yourself to the hospital. Summary  After the procedure, it is common to have bruising and tenderness at the site.  Follow instructions from your health care provider about how to take care of your radial site wound. Check the wound every day for signs of  infection.  Do not lift anything that is heavier than 10 lb (4.5 kg), or the limit that you are told, until your health care provider says that it is safe. This information is not intended to replace advice given to you by your health care provider. Make sure you discuss any questions you have with your health care provider. Document Revised: 02/06/2017 Document Reviewed: 02/06/2017 Elsevier Patient Education  2020 Elsevier Inc.  

## 2019-02-09 NOTE — Interval H&P Note (Signed)
History and Physical Interval Note:  02/09/2019 7:31 AM  [name]  has presented today for surgery, with the diagnosis of CAD with Progressive Class III Angina.  The various methods of treatment have been discussed with the patient and family. After consideration of risks, benefits and other options for treatment, the patient has consented to  Procedure(s): LEFT HEART CATH AND CORS/GRAFTS ANGIOGRAPHY (N/A)  PERCUTANEOUS CORONARY INTERVENTION  as a surgical intervention.  The patient's history has been reviewed, patient examined, no change in status, stable for surgery.  I have reviewed the patient's chart and labs.  Questions were answered to the patient's satisfaction.    Cath Lab Visit (complete for each Cath Lab visit)  Clinical Evaluation Leading to the Procedure:   ACS: No.  Non-ACS:    Anginal Classification: CCS III  Anti-ischemic medical therapy: Minimal Therapy (1 class of medications)  Non-Invasive Test Results: Intermediate-risk stress test findings: cardiac mortality 1-3%/year  Prior CABG: Previous CABG  Robert Santiago

## 2019-02-23 ENCOUNTER — Ambulatory Visit: Payer: Medicare HMO | Admitting: Cardiology

## 2019-03-07 DIAGNOSIS — E118 Type 2 diabetes mellitus with unspecified complications: Secondary | ICD-10-CM | POA: Diagnosis not present

## 2019-03-07 DIAGNOSIS — F3341 Major depressive disorder, recurrent, in partial remission: Secondary | ICD-10-CM | POA: Diagnosis not present

## 2019-03-07 DIAGNOSIS — K219 Gastro-esophageal reflux disease without esophagitis: Secondary | ICD-10-CM | POA: Diagnosis not present

## 2019-03-07 DIAGNOSIS — J309 Allergic rhinitis, unspecified: Secondary | ICD-10-CM | POA: Diagnosis not present

## 2019-03-07 DIAGNOSIS — Z6838 Body mass index (BMI) 38.0-38.9, adult: Secondary | ICD-10-CM | POA: Diagnosis not present

## 2019-03-07 DIAGNOSIS — E291 Testicular hypofunction: Secondary | ICD-10-CM | POA: Diagnosis not present

## 2019-03-07 DIAGNOSIS — I2510 Atherosclerotic heart disease of native coronary artery without angina pectoris: Secondary | ICD-10-CM | POA: Diagnosis not present

## 2019-03-19 DIAGNOSIS — Z131 Encounter for screening for diabetes mellitus: Secondary | ICD-10-CM

## 2019-03-19 DIAGNOSIS — I2510 Atherosclerotic heart disease of native coronary artery without angina pectoris: Secondary | ICD-10-CM

## 2019-03-19 DIAGNOSIS — I25119 Atherosclerotic heart disease of native coronary artery with unspecified angina pectoris: Secondary | ICD-10-CM

## 2019-03-19 DIAGNOSIS — I25710 Atherosclerosis of autologous vein coronary artery bypass graft(s) with unstable angina pectoris: Secondary | ICD-10-CM | POA: Insufficient documentation

## 2019-03-19 DIAGNOSIS — E1142 Type 2 diabetes mellitus with diabetic polyneuropathy: Secondary | ICD-10-CM

## 2019-03-19 DIAGNOSIS — E039 Hypothyroidism, unspecified: Secondary | ICD-10-CM

## 2019-03-19 DIAGNOSIS — Z794 Long term (current) use of insulin: Secondary | ICD-10-CM

## 2019-03-19 DIAGNOSIS — Z9889 Other specified postprocedural states: Secondary | ICD-10-CM | POA: Insufficient documentation

## 2019-03-19 DIAGNOSIS — E119 Type 2 diabetes mellitus without complications: Secondary | ICD-10-CM

## 2019-03-19 HISTORY — DX: Other specified postprocedural states: Z98.890

## 2019-03-19 HISTORY — DX: Type 2 diabetes mellitus without complications: E11.9

## 2019-03-19 HISTORY — DX: Encounter for screening for diabetes mellitus: Z13.1

## 2019-03-19 HISTORY — DX: Hypothyroidism, unspecified: E03.9

## 2019-03-19 HISTORY — DX: Type 2 diabetes mellitus with diabetic polyneuropathy: E11.42

## 2019-03-19 HISTORY — DX: Atherosclerotic heart disease of native coronary artery with unspecified angina pectoris: I25.119

## 2019-03-19 HISTORY — DX: Atherosclerosis of autologous vein coronary artery bypass graft(s) with unstable angina pectoris: I25.710

## 2019-03-19 HISTORY — DX: Atherosclerotic heart disease of native coronary artery without angina pectoris: I25.10

## 2019-03-19 HISTORY — DX: Long term (current) use of insulin: Z79.4

## 2019-03-26 NOTE — Progress Notes (Signed)
Cardiology Office Note:    Date:  03/27/2019   ID:  Robert Santiago, DOB 07-09-1950, MRN [id number]  PCP:  [name], MD  Cardiologist:  [name], MD    Referring MD: [name], MD    ASSESSMENT:    1. Coronary artery disease of native artery of native heart with stable angina pectoris ([place])   2. Essential hypertension   3. Mixed hyperlipidemia   4. Tremor of right hand    PLAN:    In order of problems listed above:  1. Stable CAD Heart Association class I presently having no angina on current medical therapy.  To optimize long-term graft patency he will take aspirin and clopidogrel and will also try to achieve an LDL less than 70 ideally less than 50 by switching from a low intensity pravastatin to high intensity rosuvastatin.  We will continue his medical therapy including beta-blocker triglyceride as needed.  If having frequent angina and use oral nitrates and avoid ranolazine with his disequilibrium 2. BP at target continue current treatment including ACE inhibitor with diabetes 3. See above, continue Zetia switch to rosuvastatin 4. Referral to neurology at the patient's request if he decides to proceed   Next appointment: 6 months   Medication Adjustments/Labs and Tests Ordered: Current medicines are reviewed at length with the patient today.  Concerns regarding medicines are outlined above.  No orders of the defined types were placed in this encounter.  Meds ordered this encounter  Medications  . clopidogrel (PLAVIX) 75 MG tablet    Sig: Take 1 tablet (75 mg total) by mouth daily.    Dispense:  90 tablet    Refill:  3  . rosuvastatin (CRESTOR) 10 MG tablet    Sig: Take 1 tablet (10 mg total) by mouth daily.    Dispense:  90 tablet    Refill:  3    Chief Complaint  Patient presents with  . Follow-up    With recent coronary arteriography  . Coronary Artery Disease    History of Present Illness:    Robert Santiago is a 69 y.o. male with a hx of   coronary artery disease coronary artery bypass surgery 08/23/2014 with a left thoracic artery anastomosed to the left anterior descending vein graft to the first diagonal vein graft to the ramus and vein graft to an obtuse marginal and vein graft to the right sided posterior lateral branch.  Other problems include hypertension hyperlipidemia and superficial thrombophlebitis of lower extremity.  He was seen 12/19/2018.  He was having typical exertional anginal equivalent dyspnea and underwent myocardial perfusion study for risk stratification.  The study was reported as abnormal in which shared decision making was referred for coronary angiogram. He was last seen 01/30/2019. Compliance with diet, lifestyle and medications: Yes  His wife was present for chest pain evaluation and discussions. I reviewed the results of his coronary angiogram.  I told him that the good findings are that the left thoracic artery to vein grafts are open he has had loss of 2 vein grafts but they went to branches and did not require PCI.  He is reassured.  Overall he is doing better he said no further angina he is walking 3-5 times a week 30 minutes no shortness of breath palpitation or syncope.  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  With a combination of disequilibrium and tremor he is  asked me about seeing a neurologist I gave him the name of Dr. Arlice Colt [facility] neurology.  Suspect he has an essential tremor but he may have a movement disorder.  Optimize medical therapy organ to put him on aspirin and clopidogrel for graft patency and switch to a more potent statin with a goal of an LDL less than 50.  Left heart catheterization 02/09/2019: I personally reviewed the films and agree with the conclusion and recommendation SUMMARY  Severe native coronary artery disease with essentially subtotal occlusion of the Left  Main-LAD, Ramus Intermedius (RI) and LCx trifurcation with severe disease in the proximal part of the RI  2 of 5 grafts occluded (unable to visualize via direct cannulation or root angiogram) with patent LIMA-LAD, SVG-diagonal and SVG-OM (however difficult to tell if this is the OM or ramus that is grafted)  EF does appear to be mildly reduced of roughly 45% with lateral hypokinesis.  No obvious PCI target.  Likely culprit for ischemia with occluded graft to the RI  RECOMMENDATIONS  Discharge home after bedrest  Optimize medical management. ->  Consider antianginal such as ranolazine, amlodipine and Imdur.  Could also potentially titrate up beta-blocker.  Aggressive risk factor modification.  Coronary Diagrams  Diagnostic Dominance: Right   Past Medical History:  Diagnosis Date  . COVID-19 08/2018  . Diabetes mellitus without complication ([place])   . Essential hypertension   . Hx of CABG 08/23/2014   (Dr. Darryl Nestle) CABG x5: LIMA-LAD, SVG-D1, SVG-RI, SVG-OM 1, SVG-RPL.  Marland Kitchen Hyperlipidemia   . Multiple vessel coronary artery disease 08/21/2014   Cath ([place]): Right dominant -RCA: Proximal 95, mid 95, distal 95.  LM-normal.  LAD: Proximal 80%, distal 80%; LCx: Proximal 80%, OM1 80%. -Normal EF. -->  Referred for CABG    Past Surgical History:  Procedure Laterality Date  . ACOUSTIC NEUROMA RESECTION    . APPENDECTOMY    . CARDIAC CATHETERIZATION  08/21/2014   Right dominant -RCA: Proximal 95, mid 95, distal 95.  LM-normal.  LAD: Proximal 80%, distal 80%; LCx: Proximal 80%, OM1 80%. -Normal EF. -->  Referred for CABG  . CORONARY ARTERY BYPASS GRAFT  08/23/2014   (Dr. Darryl Nestle) CABG x5: LIMA-LAD, SVG-D1, SVG-RI, SVG-OM 1, SVG-RPL.  Marland Kitchen LEFT HEART CATH AND CORS/GRAFTS ANGIOGRAPHY N/A 02/09/2019   Procedure: LEFT HEART CATH AND CORS/GRAFTS ANGIOGRAPHY;  Surgeon: Leonie Man, MD;  Location: [facility];  Service: Cardiovascular;  Laterality: N/A;  .  TRANSTHORACIC ECHOCARDIOGRAM  08/2014   ([place]) mild LVH, EF 55% with no R WMA.  Normal valves.    Current Medications: Current Meds  Medication Sig  . acetaminophen (TYLENOL) 500 MG tablet Take 1,000 mg by mouth every 6 (six) hours as needed for moderate pain or headache.  . ALPRAZolam (XANAX) 0.25 MG tablet Take 0.25 mg by mouth daily as needed for anxiety.   Marland Kitchen aspirin EC 81 MG tablet Take 81 mg by mouth daily.  . benazepril (LOTENSIN) 20 MG tablet Take 20 mg by mouth daily.  . citalopram (CELEXA) 40 MG tablet Take 40 mg by mouth daily.  . clotrimazole-betamethasone (LOTRISONE) cream Apply 1 application topically daily as needed for rash.  . ezetimibe (ZETIA) 10 MG tablet Take 1 tablet (10 mg total) by mouth daily.  . fluticasone (FLONASE) 50 MCG/ACT nasal spray Place 1 spray into both nostrils daily as needed for allergies or rhinitis.  Marland Kitchen glimepiride (AMARYL) 4 MG tablet Take 4 mg by mouth 2 (two)  times daily.   Marland Kitchen JANUVIA 100 MG tablet Take 100 mg by mouth daily.   Marland Kitchen levothyroxine (SYNTHROID) 175 MCG tablet Take 175 mcg by mouth daily.   . metFORMIN (GLUCOPHAGE) 850 MG tablet Take 850 mg by mouth 2 (two) times daily.   . metoprolol tartrate (LOPRESSOR) 25 MG tablet Take 25 mg by mouth 2 (two) times daily.   . nitroGLYCERIN (NITROSTAT) 0.4 MG SL tablet Place 1 tablet (0.4 mg total) under the tongue every 5 (five) minutes as needed for chest pain.  Marland Kitchen omeprazole (PRILOSEC) 40 MG capsule Take 40 mg by mouth daily.  . Tetrahydrozoline HCl (VISINE OP) Place 1 drop into both eyes at bedtime as needed (irritation).  . [DISCONTINUED] pravastatin (PRAVACHOL) 40 MG tablet Take 80 tablets by mouth daily.      Allergies:   Patient has no known allergies.   Social History   Socioeconomic History  . Marital status: Married    Spouse name: Not on file  . Number of children: Not on file  . Years of education: Not on file  . Highest education level: Not on file  Occupational History  . Not  on file  Tobacco Use  . Smoking status: Former Smoker    Packs/day: 1.00    Years: 30.00    Pack years: 30.00    Types: Cigarettes    Quit date: 2003    Years since quitting: 18.2  . Smokeless tobacco: Never Used  Substance and Sexual Activity  . Alcohol use: Yes    Comment: occ  . Drug use: Never  . Sexual activity: Not on file  Other Topics Concern  . Not on file  Social History Narrative  . Not on file   Social Determinants of Health   Financial Resource Strain:   . Difficulty of Paying Living Expenses:   Food Insecurity:   . Worried About Charity fundraiser in the Last Year:   . Arboriculturist in the Last Year:   Transportation Needs:   . Film/video editor (Medical):   Marland Kitchen Lack of Transportation (Non-Medical):   Physical Activity:   . Days of Exercise per Week:   . Minutes of Exercise per Session:   Stress:   . Feeling of Stress :   Social Connections:   . Frequency of Communication with Friends and Family:   . Frequency of Social Gatherings with Friends and Family:   . Attends Religious Services:   . Active Member of Clubs or Organizations:   . Attends Archivist Meetings:   Marland Kitchen Marital Status:      Family History: The patient's family history includes COPD in his mother; Heart attack in his brother; Heart disease in his brother, brother, brother, and father; Heart failure in his mother; Hyperlipidemia in his father and mother; Hypertension in his father and mother. ROS:   Please see the history of present illness.    All other systems reviewed and are negative.  EKGs/Labs/Other Studies Reviewed:    The following studies were reviewed today:    Recent Labs: 01/30/2019: BUN 16; Creatinine, Ser 0.96; Hemoglobin 15.4; Platelets 230; Potassium 4.6; Sodium 136  Recent Lipid Panel 12/25/2018 cholesterol 156 HDL 43 LDL 90  Physical Exam:    VS:  BP 124/86   Pulse 84   Temp (!) 95.4 F (35.2 C)   Ht 6' (1.829 m)   Wt 277 lb (125.6 kg)    SpO2 96%   BMI 37.57 kg/m  Wt Readings from Last 3 Encounters:  03/27/19 277 lb (125.6 kg)  02/09/19 280 lb (127 kg)  01/30/19 282 lb (127.9 kg)     GEN:  Well nourished, well developed in no acute distress has a resting tremor in the right arm no cogwheeling rigidity HEENT: Normal NECK: No JVD; No carotid bruits LYMPHATICS: No lymphadenopathy CARDIAC: RRR, no murmurs, rubs, gallops RESPIRATORY:  Clear to auscultation without rales, wheezing or rhonchi  ABDOMEN: Soft, non-tender, non-distended MUSCULOSKELETAL:  No edema; No deformity  SKIN: Warm and dry NEUROLOGIC:  Alert and oriented x 3 PSYCHIATRIC:  Normal affect    Signed, [name], MD  03/27/2019 10:43 AM    [place]

## 2019-03-27 ENCOUNTER — Encounter: Payer: Self-pay | Admitting: Cardiology

## 2019-03-27 ENCOUNTER — Other Ambulatory Visit: Payer: Self-pay

## 2019-03-27 ENCOUNTER — Ambulatory Visit: Payer: Medicare HMO | Admitting: Cardiology

## 2019-03-27 VITALS — BP 124/86 | HR 84 | Temp 95.4°F | Ht 72.0 in | Wt 277.0 lb

## 2019-03-27 DIAGNOSIS — R251 Tremor, unspecified: Secondary | ICD-10-CM | POA: Diagnosis not present

## 2019-03-27 DIAGNOSIS — E782 Mixed hyperlipidemia: Secondary | ICD-10-CM

## 2019-03-27 DIAGNOSIS — I25118 Atherosclerotic heart disease of native coronary artery with other forms of angina pectoris: Secondary | ICD-10-CM

## 2019-03-27 DIAGNOSIS — I10 Essential (primary) hypertension: Secondary | ICD-10-CM | POA: Diagnosis not present

## 2019-03-27 MED ORDER — CLOPIDOGREL BISULFATE 75 MG PO TABS: 75.0000 mg | ORAL_TABLET | Freq: Every day | ORAL | 3 refills | Status: DC

## 2019-03-27 MED ORDER — ROSUVASTATIN CALCIUM 10 MG PO TABS: 10.0000 mg | ORAL_TABLET | Freq: Every day | ORAL | 3 refills | Status: DC

## 2019-03-27 NOTE — Patient Instructions (Addendum)
Medication Instructions:  Your physician has recommended you make the following change in your medication:  1.  START Plavix (Clopidegrel) 75 mg taking 1 daily 2.  CONTINUE Aspirin 3.  STOP Pravastatin 4.  START Rosuvastatin 10 mg taking 1 daily   *If you need a refill on your cardiac medications before your next appointment, please call your pharmacy*   Lab Work: None ordered  If you have labs (blood work) drawn today and your tests are completely normal, you will receive your results only by: Marland Kitchen MyChart Message (if you have MyChart) OR . A paper copy in the mail If you have any lab test that is abnormal or we need to change your treatment, we will call you to review the results.   Testing/Procedures: None ordered   Follow-Up: At [facility], you and your health needs are our priority.  As part of our continuing mission to provide you with exceptional heart care, we have created designated Provider Care Teams.  These Care Teams include your primary Cardiologist (physician) and Advanced Practice Providers (APPs -  Physician Assistants and Nurse Practitioners) who all work together to provide you with the care you need, when you need it.  We recommend signing up for the patient portal called "MyChart".  Sign up information is provided on this After Visit Summary.  MyChart is used to connect with patients for Virtual Visits (Telemedicine).  Patients are able to view lab/test results, encounter notes, upcoming appointments, etc.  Non-urgent messages can be sent to your provider as well.   To learn more about what you can do with MyChart, go to [email or web address].    Your next appointment:   6 month(s)  The format for your next appointment:   In Person  Provider:   [name], MD   Other Instructions

## 2019-04-13 ENCOUNTER — Ambulatory Visit: Payer: Medicare HMO | Admitting: Cardiology

## 2019-06-16 DIAGNOSIS — E039 Hypothyroidism, unspecified: Secondary | ICD-10-CM | POA: Diagnosis not present

## 2019-06-16 DIAGNOSIS — F3341 Major depressive disorder, recurrent, in partial remission: Secondary | ICD-10-CM | POA: Diagnosis not present

## 2019-06-16 DIAGNOSIS — I10 Essential (primary) hypertension: Secondary | ICD-10-CM | POA: Diagnosis not present

## 2019-06-16 DIAGNOSIS — J309 Allergic rhinitis, unspecified: Secondary | ICD-10-CM | POA: Diagnosis not present

## 2019-06-16 DIAGNOSIS — E118 Type 2 diabetes mellitus with unspecified complications: Secondary | ICD-10-CM | POA: Diagnosis not present

## 2019-06-16 DIAGNOSIS — E291 Testicular hypofunction: Secondary | ICD-10-CM | POA: Diagnosis not present

## 2019-06-16 DIAGNOSIS — K219 Gastro-esophageal reflux disease without esophagitis: Secondary | ICD-10-CM | POA: Diagnosis not present

## 2019-06-16 DIAGNOSIS — E785 Hyperlipidemia, unspecified: Secondary | ICD-10-CM | POA: Diagnosis not present

## 2019-06-16 DIAGNOSIS — I2510 Atherosclerotic heart disease of native coronary artery without angina pectoris: Secondary | ICD-10-CM | POA: Diagnosis not present

## 2019-06-22 DIAGNOSIS — E291 Testicular hypofunction: Secondary | ICD-10-CM | POA: Diagnosis not present

## 2019-06-22 DIAGNOSIS — K219 Gastro-esophageal reflux disease without esophagitis: Secondary | ICD-10-CM | POA: Diagnosis not present

## 2019-06-22 DIAGNOSIS — E785 Hyperlipidemia, unspecified: Secondary | ICD-10-CM | POA: Diagnosis not present

## 2019-06-22 DIAGNOSIS — J309 Allergic rhinitis, unspecified: Secondary | ICD-10-CM | POA: Diagnosis not present

## 2019-06-22 DIAGNOSIS — E118 Type 2 diabetes mellitus with unspecified complications: Secondary | ICD-10-CM | POA: Diagnosis not present

## 2019-06-22 DIAGNOSIS — I10 Essential (primary) hypertension: Secondary | ICD-10-CM | POA: Diagnosis not present

## 2019-06-22 DIAGNOSIS — I2510 Atherosclerotic heart disease of native coronary artery without angina pectoris: Secondary | ICD-10-CM | POA: Diagnosis not present

## 2019-06-22 DIAGNOSIS — E039 Hypothyroidism, unspecified: Secondary | ICD-10-CM | POA: Diagnosis not present

## 2019-06-22 DIAGNOSIS — F3341 Major depressive disorder, recurrent, in partial remission: Secondary | ICD-10-CM | POA: Diagnosis not present

## 2019-06-25 DIAGNOSIS — I10 Essential (primary) hypertension: Secondary | ICD-10-CM | POA: Diagnosis not present

## 2019-06-25 DIAGNOSIS — J309 Allergic rhinitis, unspecified: Secondary | ICD-10-CM | POA: Diagnosis not present

## 2019-06-25 DIAGNOSIS — I2510 Atherosclerotic heart disease of native coronary artery without angina pectoris: Secondary | ICD-10-CM | POA: Diagnosis not present

## 2019-06-25 DIAGNOSIS — E785 Hyperlipidemia, unspecified: Secondary | ICD-10-CM | POA: Diagnosis not present

## 2019-06-25 DIAGNOSIS — E291 Testicular hypofunction: Secondary | ICD-10-CM | POA: Diagnosis not present

## 2019-06-25 DIAGNOSIS — E118 Type 2 diabetes mellitus with unspecified complications: Secondary | ICD-10-CM | POA: Diagnosis not present

## 2019-06-25 DIAGNOSIS — E039 Hypothyroidism, unspecified: Secondary | ICD-10-CM | POA: Diagnosis not present

## 2019-06-25 DIAGNOSIS — F3341 Major depressive disorder, recurrent, in partial remission: Secondary | ICD-10-CM | POA: Diagnosis not present

## 2019-06-25 DIAGNOSIS — K219 Gastro-esophageal reflux disease without esophagitis: Secondary | ICD-10-CM | POA: Diagnosis not present

## 2019-07-13 DIAGNOSIS — F3341 Major depressive disorder, recurrent, in partial remission: Secondary | ICD-10-CM | POA: Diagnosis not present

## 2019-07-13 DIAGNOSIS — E291 Testicular hypofunction: Secondary | ICD-10-CM | POA: Diagnosis not present

## 2019-07-13 DIAGNOSIS — K219 Gastro-esophageal reflux disease without esophagitis: Secondary | ICD-10-CM | POA: Diagnosis not present

## 2019-07-13 DIAGNOSIS — I2510 Atherosclerotic heart disease of native coronary artery without angina pectoris: Secondary | ICD-10-CM | POA: Diagnosis not present

## 2019-07-13 DIAGNOSIS — E039 Hypothyroidism, unspecified: Secondary | ICD-10-CM | POA: Diagnosis not present

## 2019-07-13 DIAGNOSIS — E785 Hyperlipidemia, unspecified: Secondary | ICD-10-CM | POA: Diagnosis not present

## 2019-07-13 DIAGNOSIS — E118 Type 2 diabetes mellitus with unspecified complications: Secondary | ICD-10-CM | POA: Diagnosis not present

## 2019-07-13 DIAGNOSIS — I10 Essential (primary) hypertension: Secondary | ICD-10-CM | POA: Diagnosis not present

## 2019-07-13 DIAGNOSIS — J309 Allergic rhinitis, unspecified: Secondary | ICD-10-CM | POA: Diagnosis not present

## 2019-07-29 DIAGNOSIS — K219 Gastro-esophageal reflux disease without esophagitis: Secondary | ICD-10-CM | POA: Diagnosis not present

## 2019-07-29 DIAGNOSIS — I2510 Atherosclerotic heart disease of native coronary artery without angina pectoris: Secondary | ICD-10-CM | POA: Diagnosis not present

## 2019-07-29 DIAGNOSIS — F3341 Major depressive disorder, recurrent, in partial remission: Secondary | ICD-10-CM | POA: Diagnosis not present

## 2019-07-29 DIAGNOSIS — E039 Hypothyroidism, unspecified: Secondary | ICD-10-CM | POA: Diagnosis not present

## 2019-07-29 DIAGNOSIS — J309 Allergic rhinitis, unspecified: Secondary | ICD-10-CM | POA: Diagnosis not present

## 2019-07-29 DIAGNOSIS — E118 Type 2 diabetes mellitus with unspecified complications: Secondary | ICD-10-CM | POA: Diagnosis not present

## 2019-07-29 DIAGNOSIS — Z139 Encounter for screening, unspecified: Secondary | ICD-10-CM | POA: Diagnosis not present

## 2019-07-29 DIAGNOSIS — E291 Testicular hypofunction: Secondary | ICD-10-CM | POA: Diagnosis not present

## 2019-07-29 DIAGNOSIS — Z9181 History of falling: Secondary | ICD-10-CM | POA: Diagnosis not present

## 2019-09-24 DIAGNOSIS — E039 Hypothyroidism, unspecified: Secondary | ICD-10-CM | POA: Diagnosis not present

## 2019-09-24 DIAGNOSIS — E118 Type 2 diabetes mellitus with unspecified complications: Secondary | ICD-10-CM | POA: Diagnosis not present

## 2019-09-24 DIAGNOSIS — J309 Allergic rhinitis, unspecified: Secondary | ICD-10-CM | POA: Diagnosis not present

## 2019-09-24 DIAGNOSIS — E291 Testicular hypofunction: Secondary | ICD-10-CM | POA: Diagnosis not present

## 2019-09-24 DIAGNOSIS — E785 Hyperlipidemia, unspecified: Secondary | ICD-10-CM | POA: Diagnosis not present

## 2019-09-24 DIAGNOSIS — F3341 Major depressive disorder, recurrent, in partial remission: Secondary | ICD-10-CM | POA: Diagnosis not present

## 2019-09-24 DIAGNOSIS — K219 Gastro-esophageal reflux disease without esophagitis: Secondary | ICD-10-CM | POA: Diagnosis not present

## 2019-09-24 DIAGNOSIS — I2510 Atherosclerotic heart disease of native coronary artery without angina pectoris: Secondary | ICD-10-CM | POA: Diagnosis not present

## 2019-09-24 DIAGNOSIS — I10 Essential (primary) hypertension: Secondary | ICD-10-CM | POA: Diagnosis not present

## 2019-10-05 DIAGNOSIS — E119 Type 2 diabetes mellitus without complications: Secondary | ICD-10-CM | POA: Insufficient documentation

## 2019-10-05 NOTE — Progress Notes (Signed)
Cardiology Office Note:    Date:  10/06/2019   ID:  Robert Santiago, DOB 07-08-1950, MRN [id number]  PCP:  [name], MD  Cardiologist:  [name], MD    Referring MD: [name], MD    ASSESSMENT:    1. Coronary artery disease of native artery of native heart with stable angina pectoris ([place])   2. Essential hypertension   3. Mixed hyperlipidemia    PLAN:    In order of problems listed above:  1. Doing well no anginal discomfort on current medical therapy and following CABG.  He was not a good candidate for further revascularization coronary angiography will continue intense medical therapy including chronic dual antiplatelet therapy beta-blocker and combined lipid-lowering with a high intensity statin and Zetia. 2. BP at target continue current treatment 3. Diabetes improved to be pending a follow-up A1c and I suspect it will be close to 7.  Strongly encouraged him to continue diet and tips for weight loss.  I asked him to consider a recumbent bike for exercise with his neuropathy 4. Stable continue combined therapy lipids at target   Next appointment: 6 months   Medication Adjustments/Labs and Tests Ordered: Current medicines are reviewed at length with the patient today.  Concerns regarding medicines are outlined above.  No orders of the defined types were placed in this encounter.  Meds ordered this encounter  Medications  . nitroGLYCERIN (NITROSTAT) 0.4 MG SL tablet    Sig: Place 1 tablet (0.4 mg total) under the tongue every 5 (five) minutes as needed for chest pain.    Dispense:  25 tablet    Refill:  3    Follow-up for CAD  History of Present Illness:    Robert Santiago is a 69 y.o. male with a hx of coronary artery disease coronary artery bypass surgery 08/23/2014 with a left thoracic artery anastomosed to the left anterior descending vein graft to the first diagonal vein graft to the ramus and vein graft to an obtuse marginal and vein graft to the right sided  posterior lateral branch.  Other problems include hypertension hyperlipidemia and superficial thrombophlebitis of lower extremity.  He seen 12/19/2018.  He was having typical exertional anginal equivalent dyspnea and underwent myocardial perfusion study for risk stratification.  The study was reported as abnormal and with shared decision making he was referred for coronary angiogram coronary angiography performed 02/08/1998 2102 of his 5 grafts to be occluded with patent left thoracic artery to the LAD and a vein graft to the diagonal and marginal branch. The ejection fraction of the range of 45% with lateral hypokinesia he had severe native coronary artery disease with subtotal occlusion of left main left anterior descending ramus and left circumflex trifurcation. He was felt to be best treated medically. He was last seen 03/27/2019. He also had a resting tremor and his request was referred to neurology. Compliance with diet, lifestyle and medications: Yes  Overall he is doing well he still working part-time he does heavy physical labor he finds himself tired afterwards but no exercise intolerance chest pain dyspnea palpitation or syncope.  He is focused on diabetes his fasting blood sugars run in the range of 130.  Unfortunately is gained 20 pounds of weight we discussed tips for successful weight loss and diet with diabetes.  He also struggles with neuropathy and is no longer in a walking program.  No orthopnea edema palpitation or syncope. Past Medical History:  Diagnosis Date  . Acquired hypothyroidism 03/19/2019  .  Atherosclerosis of autologous vein coronary artery bypass graft(s) with unstable angina pectoris ([place]) 03/19/2019  . Coronary artery disease involving native coronary artery of native heart with angina pectoris ([place]) 03/19/2019   Cath 08/20/14: Conclusions Diagnostic Procedure Summary 1. Severe, diffuse multi-vessel CAD 2. Normal LV function Diagnostic Procedure Recommendations CTS consult  Angiographic findings Cardiac Arteries and Lesion Findings LMCA: Normal. LAD: Lesion on Prox LAD: 80% stenosis. Lesion on Dist LAD: 80% stenosis. LCx: Lesion on Prox CX: Proximal subsection.80% stenosis. Lesion on 1st Ob Marg: 80% stenos  . Coronary artery disease of native artery of native heart with stable angina pectoris ([place])   . COVID-19 08/2018  . Diabetes mellitus without complication ([place])   . Encounter for screening for diabetes mellitus 03/19/2019   IMOUPDATE  . Essential hypertension   . Hx of CABG 08/23/2014   (Dr. Darryl Nestle) CABG x5: LIMA-LAD, SVG-D1, SVG-RI, SVG-OM 1, SVG-RPL.  Marland Kitchen Hyperlipidemia   . Multiple vessel coronary artery disease 08/21/2014   Cath ([place]): Right dominant -RCA: Proximal 95, mid 95, distal 95.  LM-normal.  LAD: Proximal 80%, distal 80%; LCx: Proximal 80%, OM1 80%. -Normal EF. -->  Referred for CABG  . Progressive angina ([facility])-class III 12/19/2018  . Status post cardiac catheterization 03/19/2019  . Type 2 diabetes mellitus without complication ([place]) 07/23/4694    Past Surgical History:  Procedure Laterality Date  . ACOUSTIC NEUROMA RESECTION    . APPENDECTOMY    . CARDIAC CATHETERIZATION  08/21/2014   Right dominant -RCA: Proximal 95, mid 95, distal 95.  LM-normal.  LAD: Proximal 80%, distal 80%; LCx: Proximal 80%, OM1 80%. -Normal EF. -->  Referred for CABG  . CORONARY ARTERY BYPASS GRAFT  08/23/2014   (Dr. Darryl Nestle) CABG x5: LIMA-LAD, SVG-D1, SVG-RI, SVG-OM 1, SVG-RPL.  Marland Kitchen LEFT HEART CATH AND CORS/GRAFTS ANGIOGRAPHY N/A 02/09/2019   Procedure: LEFT HEART CATH AND CORS/GRAFTS ANGIOGRAPHY;  Surgeon: Leonie Man, MD;  Location: [facility];  Service: Cardiovascular;  Laterality: N/A;  . TRANSTHORACIC ECHOCARDIOGRAM  08/2014   ([place]) mild LVH, EF 55% with no R WMA.  Normal valves.    Current Medications: Current Meds  Medication Sig  . acetaminophen (TYLENOL) 500 MG tablet Take 1,000 mg by mouth every 6 (six) hours  as needed for moderate pain or headache.  . ALPRAZolam (XANAX) 0.25 MG tablet Take 0.25 mg by mouth daily as needed for anxiety.   Marland Kitchen aspirin EC 81 MG tablet Take 81 mg by mouth daily.  . benazepril (LOTENSIN) 20 MG tablet Take 20 mg by mouth daily.  . citalopram (CELEXA) 40 MG tablet Take 40 mg by mouth daily.  . clopidogrel (PLAVIX) 75 MG tablet Take 1 tablet (75 mg total) by mouth daily.  . clotrimazole-betamethasone (LOTRISONE) cream Apply 1 application topically daily as needed for rash.  . ezetimibe (ZETIA) 10 MG tablet Take 1 tablet (10 mg total) by mouth daily.  . fluticasone (FLONASE) 50 MCG/ACT nasal spray Place 1 spray into both nostrils daily as needed for allergies or rhinitis.  Marland Kitchen glimepiride (AMARYL) 4 MG tablet Take 4 mg by mouth 2 (two) times daily.   Marland Kitchen JANUVIA 100 MG tablet Take 100 mg by mouth daily.   Marland Kitchen LANTUS SOLOSTAR 100 UNIT/ML Solostar Pen 40 Units daily.  Marland Kitchen levothyroxine (SYNTHROID) 175 MCG tablet Take 175 mcg by mouth daily.   . metFORMIN (GLUCOPHAGE) 850 MG tablet Take 850 mg by mouth 2 (two) times daily.   . metoprolol tartrate (LOPRESSOR) 25  MG tablet Take 25 mg by mouth 2 (two) times daily.   . nitroGLYCERIN (NITROSTAT) 0.4 MG SL tablet Place 1 tablet (0.4 mg total) under the tongue every 5 (five) minutes as needed for chest pain.  Marland Kitchen omeprazole (PRILOSEC) 40 MG capsule Take 40 mg by mouth daily.  . rosuvastatin (CRESTOR) 10 MG tablet Take 1 tablet (10 mg total) by mouth daily.  . Tetrahydrozoline HCl (VISINE OP) Place 1 drop into both eyes at bedtime as needed (irritation).  . [DISCONTINUED] nitroGLYCERIN (NITROSTAT) 0.4 MG SL tablet Place 1 tablet (0.4 mg total) under the tongue every 5 (five) minutes as needed for chest pain.     Allergies:   Patient has no known allergies.   Social History   Socioeconomic History  . Marital status: Married    Spouse name: Not on file  . Number of children: Not on file  . Years of education: Not on file  . Highest  education level: Not on file  Occupational History  . Not on file  Tobacco Use  . Smoking status: Former Smoker    Packs/day: 1.00    Years: 30.00    Pack years: 30.00    Types: Cigarettes    Quit date: 2003    Years since quitting: 18.7  . Smokeless tobacco: Never Used  Vaping Use  . Vaping Use: Never used  Substance and Sexual Activity  . Alcohol use: Yes    Comment: occ  . Drug use: Never  . Sexual activity: Not on file  Other Topics Concern  . Not on file  Social History Narrative  . Not on file   Social Determinants of Health   Financial Resource Strain:   . Difficulty of Paying Living Expenses: Not on file  Food Insecurity:   . Worried About Charity fundraiser in the Last Year: Not on file  . Ran Out of Food in the Last Year: Not on file  Transportation Needs:   . Lack of Transportation (Medical): Not on file  . Lack of Transportation (Non-Medical): Not on file  Physical Activity:   . Days of Exercise per Week: Not on file  . Minutes of Exercise per Session: Not on file  Stress:   . Feeling of Stress : Not on file  Social Connections:   . Frequency of Communication with Friends and Family: Not on file  . Frequency of Social Gatherings with Friends and Family: Not on file  . Attends Religious Services: Not on file  . Active Member of Clubs or Organizations: Not on file  . Attends Archivist Meetings: Not on file  . Marital Status: Not on file     Family History: The patient's family history includes COPD in his mother; Heart attack in his brother; Heart disease in his brother, brother, brother, and father; Heart failure in his mother; Hyperlipidemia in his father and mother; Hypertension in his father and mother. ROS:   Please see the history of present illness.    All other systems reviewed and are negative.  EKGs/Labs/Other Studies Reviewed:    The following studies were reviewed today:    Recent Labs:  06/25/2019: Lipids at target  cholesterol 128 LDL 71 triglycerides 102 HDL 38 A1c 9.6% creatinine is normal TSH normal 0.51 01/30/2019: BUN 16; Creatinine, Ser 0.96; Hemoglobin 15.4; Platelets 230; Potassium 4.6; Sodium 136  Recent Lipid Panel No results found for: CHOL, TRIG, HDL, CHOLHDL, VLDL, LDLCALC, LDLDIRECT  Physical Exam:    VS:  BP 126/71   Pulse 68   Ht 6' (1.829 m)   Wt 290 lb 12.8 oz (131.9 kg)   SpO2 95%   BMI 39.44 kg/m     Wt Readings from Last 3 Encounters:  10/06/19 290 lb 12.8 oz (131.9 kg)  03/27/19 277 lb (125.6 kg)  02/09/19 280 lb (127 kg)     GEN: Obese well nourished, well developed in no acute distress HEENT: Normal NECK: No JVD; No carotid bruits LYMPHATICS: No lymphadenopathy CARDIAC: RRR, no murmurs, rubs, gallops RESPIRATORY:  Clear to auscultation without rales, wheezing or rhonchi  ABDOMEN: Soft, non-tender, non-distended MUSCULOSKELETAL:  No edema; No deformity  SKIN: Warm and dry NEUROLOGIC:  Alert and oriented x 3 PSYCHIATRIC:  Normal affect    Signed, [name], MD  10/06/2019 8:40 AM    [facility]

## 2019-10-06 ENCOUNTER — Encounter: Payer: Self-pay | Admitting: Cardiology

## 2019-10-06 ENCOUNTER — Other Ambulatory Visit: Payer: Self-pay

## 2019-10-06 ENCOUNTER — Ambulatory Visit: Payer: Medicare HMO | Admitting: Cardiology

## 2019-10-06 VITALS — BP 126/71 | HR 68 | Ht 72.0 in | Wt 290.8 lb

## 2019-10-06 DIAGNOSIS — E782 Mixed hyperlipidemia: Secondary | ICD-10-CM

## 2019-10-06 DIAGNOSIS — I25118 Atherosclerotic heart disease of native coronary artery with other forms of angina pectoris: Secondary | ICD-10-CM

## 2019-10-06 DIAGNOSIS — I10 Essential (primary) hypertension: Secondary | ICD-10-CM

## 2019-10-06 DIAGNOSIS — I25709 Atherosclerosis of coronary artery bypass graft(s), unspecified, with unspecified angina pectoris: Secondary | ICD-10-CM | POA: Diagnosis not present

## 2019-10-06 MED ORDER — NITROGLYCERIN 0.4 MG SL SUBL: 0.4000 mg | SUBLINGUAL_TABLET | SUBLINGUAL | 3 refills | Status: DC | PRN

## 2019-10-06 NOTE — Patient Instructions (Signed)
Medication Instructions:  Your physician recommends that you continue on your current medications as directed. Please refer to the Current Medication list given to you today.  *If you need a refill on your cardiac medications before your next appointment, please call your pharmacy*   Lab Work: None ordered   If you have labs (blood work) drawn today and your tests are completely normal, you will receive your results only by: . MyChart Message (if you have MyChart) OR . A paper copy in the mail If you have any lab test that is abnormal or we need to change your treatment, we will call you to review the results.   Testing/Procedures: None ordered    Follow-Up: At [facility], you and your health needs are our priority.  As part of our continuing mission to provide you with exceptional heart care, we have created designated Provider Care Teams.  These Care Teams include your primary Cardiologist (physician) and Advanced Practice Providers (APPs -  Physician Assistants and Nurse Practitioners) who all work together to provide you with the care you need, when you need it.  We recommend signing up for the patient portal called "MyChart".  Sign up information is provided on this After Visit Summary.  MyChart is used to connect with patients for Virtual Visits (Telemedicine).  Patients are able to view lab/test results, encounter notes, upcoming appointments, etc.  Non-urgent messages can be sent to your provider as well.   To learn more about what you can do with MyChart, go to [email or web address].    Your next appointment:   6 month(s)  The format for your next appointment:   In Person  Provider:   Brian Munley, MD   Other Instructions None   

## 2019-10-20 DIAGNOSIS — I2510 Atherosclerotic heart disease of native coronary artery without angina pectoris: Secondary | ICD-10-CM | POA: Diagnosis not present

## 2019-10-20 DIAGNOSIS — E118 Type 2 diabetes mellitus with unspecified complications: Secondary | ICD-10-CM | POA: Diagnosis not present

## 2019-10-20 DIAGNOSIS — Z23 Encounter for immunization: Secondary | ICD-10-CM | POA: Diagnosis not present

## 2019-10-20 DIAGNOSIS — E291 Testicular hypofunction: Secondary | ICD-10-CM | POA: Diagnosis not present

## 2019-10-20 DIAGNOSIS — E039 Hypothyroidism, unspecified: Secondary | ICD-10-CM | POA: Diagnosis not present

## 2019-10-20 DIAGNOSIS — F3341 Major depressive disorder, recurrent, in partial remission: Secondary | ICD-10-CM | POA: Diagnosis not present

## 2019-10-20 DIAGNOSIS — M722 Plantar fascial fibromatosis: Secondary | ICD-10-CM | POA: Diagnosis not present

## 2019-10-20 DIAGNOSIS — K219 Gastro-esophageal reflux disease without esophagitis: Secondary | ICD-10-CM | POA: Diagnosis not present

## 2019-10-20 DIAGNOSIS — J309 Allergic rhinitis, unspecified: Secondary | ICD-10-CM | POA: Diagnosis not present

## 2019-10-22 DIAGNOSIS — J309 Allergic rhinitis, unspecified: Secondary | ICD-10-CM | POA: Diagnosis not present

## 2019-10-22 DIAGNOSIS — I2510 Atherosclerotic heart disease of native coronary artery without angina pectoris: Secondary | ICD-10-CM | POA: Diagnosis not present

## 2019-10-22 DIAGNOSIS — M722 Plantar fascial fibromatosis: Secondary | ICD-10-CM | POA: Diagnosis not present

## 2019-10-22 DIAGNOSIS — E118 Type 2 diabetes mellitus with unspecified complications: Secondary | ICD-10-CM | POA: Diagnosis not present

## 2019-10-22 DIAGNOSIS — F3341 Major depressive disorder, recurrent, in partial remission: Secondary | ICD-10-CM | POA: Diagnosis not present

## 2019-10-22 DIAGNOSIS — E291 Testicular hypofunction: Secondary | ICD-10-CM | POA: Diagnosis not present

## 2019-10-22 DIAGNOSIS — K219 Gastro-esophageal reflux disease without esophagitis: Secondary | ICD-10-CM | POA: Diagnosis not present

## 2019-10-22 DIAGNOSIS — E039 Hypothyroidism, unspecified: Secondary | ICD-10-CM | POA: Diagnosis not present

## 2019-10-22 DIAGNOSIS — I10 Essential (primary) hypertension: Secondary | ICD-10-CM | POA: Diagnosis not present

## 2019-10-22 DIAGNOSIS — E785 Hyperlipidemia, unspecified: Secondary | ICD-10-CM | POA: Diagnosis not present

## 2019-12-12 DIAGNOSIS — J309 Allergic rhinitis, unspecified: Secondary | ICD-10-CM | POA: Diagnosis not present

## 2019-12-12 DIAGNOSIS — K219 Gastro-esophageal reflux disease without esophagitis: Secondary | ICD-10-CM | POA: Diagnosis not present

## 2019-12-12 DIAGNOSIS — F3341 Major depressive disorder, recurrent, in partial remission: Secondary | ICD-10-CM | POA: Diagnosis not present

## 2019-12-12 DIAGNOSIS — L84 Corns and callosities: Secondary | ICD-10-CM | POA: Diagnosis not present

## 2019-12-12 DIAGNOSIS — M722 Plantar fascial fibromatosis: Secondary | ICD-10-CM | POA: Diagnosis not present

## 2019-12-12 DIAGNOSIS — E291 Testicular hypofunction: Secondary | ICD-10-CM | POA: Diagnosis not present

## 2019-12-12 DIAGNOSIS — I2510 Atherosclerotic heart disease of native coronary artery without angina pectoris: Secondary | ICD-10-CM | POA: Diagnosis not present

## 2019-12-24 DIAGNOSIS — F3341 Major depressive disorder, recurrent, in partial remission: Secondary | ICD-10-CM | POA: Diagnosis not present

## 2019-12-24 DIAGNOSIS — J309 Allergic rhinitis, unspecified: Secondary | ICD-10-CM | POA: Diagnosis not present

## 2019-12-24 DIAGNOSIS — E118 Type 2 diabetes mellitus with unspecified complications: Secondary | ICD-10-CM | POA: Diagnosis not present

## 2019-12-24 DIAGNOSIS — E039 Hypothyroidism, unspecified: Secondary | ICD-10-CM | POA: Diagnosis not present

## 2019-12-24 DIAGNOSIS — I10 Essential (primary) hypertension: Secondary | ICD-10-CM | POA: Diagnosis not present

## 2019-12-24 DIAGNOSIS — E785 Hyperlipidemia, unspecified: Secondary | ICD-10-CM | POA: Diagnosis not present

## 2019-12-24 DIAGNOSIS — I2510 Atherosclerotic heart disease of native coronary artery without angina pectoris: Secondary | ICD-10-CM | POA: Diagnosis not present

## 2019-12-24 DIAGNOSIS — E291 Testicular hypofunction: Secondary | ICD-10-CM | POA: Diagnosis not present

## 2019-12-24 DIAGNOSIS — K219 Gastro-esophageal reflux disease without esophagitis: Secondary | ICD-10-CM | POA: Diagnosis not present

## 2019-12-29 ENCOUNTER — Ambulatory Visit: Payer: Medicare HMO | Admitting: Sports Medicine

## 2019-12-29 ENCOUNTER — Other Ambulatory Visit: Payer: Self-pay

## 2019-12-29 ENCOUNTER — Encounter: Payer: Self-pay | Admitting: Sports Medicine

## 2019-12-29 DIAGNOSIS — M79675 Pain in left toe(s): Secondary | ICD-10-CM | POA: Diagnosis not present

## 2019-12-29 DIAGNOSIS — B351 Tinea unguium: Secondary | ICD-10-CM

## 2019-12-29 DIAGNOSIS — L97511 Non-pressure chronic ulcer of other part of right foot limited to breakdown of skin: Secondary | ICD-10-CM | POA: Diagnosis not present

## 2019-12-29 DIAGNOSIS — M79674 Pain in right toe(s): Secondary | ICD-10-CM | POA: Diagnosis not present

## 2019-12-29 DIAGNOSIS — E1142 Type 2 diabetes mellitus with diabetic polyneuropathy: Secondary | ICD-10-CM | POA: Diagnosis not present

## 2019-12-29 DIAGNOSIS — M21619 Bunion of unspecified foot: Secondary | ICD-10-CM

## 2019-12-29 DIAGNOSIS — M2042 Other hammer toe(s) (acquired), left foot: Secondary | ICD-10-CM

## 2019-12-29 DIAGNOSIS — M2041 Other hammer toe(s) (acquired), right foot: Secondary | ICD-10-CM

## 2019-12-29 MED ORDER — GABAPENTIN 300 MG PO CAPS: 300.0000 mg | ORAL_CAPSULE | Freq: Every day | ORAL | 3 refills | Status: DC

## 2019-12-29 NOTE — Patient Instructions (Signed)
Do not use toe cushion on right Apply iodosorb cream and bandaid during the day to right 5th toe, may leave open to air at night  Take Gabapentin for your nerves at bedtime  For nail fungus may use OTC fungi-nail or Kerasal topical to thick nails daily

## 2019-12-29 NOTE — Progress Notes (Signed)
Subjective: Robert Santiago is a 69 y.o. male patient with history of diabetes who presents to office today complaining of long,mildly painful nails  while ambulating in shoes; unable to trim. Patient states that the glucose reading this morning was 127 mg/dl does not recall last A1c. Patient denies any new changes in medication or new problems admits to a history of neuropathy and admits to issue with his baby toes rubbing states that his right fifth toe is the worst.  Last visit to Dr. Truman Hayward was last month  Patient Active Problem List   Diagnosis Date Noted  . Diabetes mellitus without complication ([place])   . Acquired hypothyroidism 03/19/2019  . Atherosclerosis of autologous vein coronary artery bypass graft(s) with unstable angina pectoris ([place]) 03/19/2019  . Encounter for screening for diabetes mellitus 03/19/2019  . Status post cardiac catheterization 03/19/2019  . Type 2 diabetes mellitus without complication ([place]) 71/24/5809  . Coronary artery disease involving native coronary artery of native heart with angina pectoris ([place]) 03/19/2019  . Coronary artery disease of native artery of native heart with stable angina pectoris ([place])   . Hyperlipidemia   . Essential hypertension   . Progressive angina (HCC)-class III 12/19/2018  . COVID-19 08/2018  . Hx of CABG 08/23/2014  . Multiple vessel coronary artery disease 08/21/2014   Current Outpatient Medications on File Prior to Visit  Medication Sig Dispense Refill  . acetaminophen (TYLENOL) 500 MG tablet Take 1,000 mg by mouth every 6 (six) hours as needed for moderate pain or headache.    . ALPRAZolam (XANAX) 0.25 MG tablet Take 0.25 mg by mouth daily as needed for anxiety.     Marland Kitchen aspirin EC 81 MG tablet Take 81 mg by mouth daily.    . BD PEN NEEDLE NANO U/F 32G X 4 MM MISC     . benazepril (LOTENSIN) 20 MG tablet Take 20 mg by mouth daily.    . citalopram (CELEXA) 40 MG tablet Take 40 mg by mouth daily.    . clopidogrel (PLAVIX) 75 MG  tablet Take 1 tablet (75 mg total) by mouth daily. 90 tablet 3  . clotrimazole-betamethasone (LOTRISONE) cream Apply 1 application topically daily as needed for rash.    . ezetimibe (ZETIA) 10 MG tablet Take 1 tablet (10 mg total) by mouth daily. 90 tablet 3  . fluticasone (FLONASE) 50 MCG/ACT nasal spray Place 1 spray into both nostrils daily as needed for allergies or rhinitis.    Marland Kitchen glimepiride (AMARYL) 4 MG tablet Take 4 mg by mouth 2 (two) times daily.     Marland Kitchen LANTUS SOLOSTAR 100 UNIT/ML Solostar Pen 40 Units daily.    Marland Kitchen levothyroxine (SYNTHROID) 175 MCG tablet Take 175 mcg by mouth daily.     . meloxicam (MOBIC) 15 MG tablet     . metFORMIN (GLUCOPHAGE) 850 MG tablet Take 850 mg by mouth 2 (two) times daily.     . metoprolol tartrate (LOPRESSOR) 25 MG tablet Take 25 mg by mouth 2 (two) times daily.     . nitroGLYCERIN (NITROSTAT) 0.4 MG SL tablet Place 1 tablet (0.4 mg total) under the tongue every 5 (five) minutes as needed for chest pain. 25 tablet 3  . omeprazole (PRILOSEC) 40 MG capsule Take 40 mg by mouth daily.    . rosuvastatin (CRESTOR) 10 MG tablet Take 1 tablet (10 mg total) by mouth daily. 90 tablet 3  . Tetrahydrozoline HCl (VISINE OP) Place 1 drop into both eyes at bedtime as needed (irritation).  No current facility-administered medications on file prior to visit.   No Known Allergies  No results found for this or any previous visit (from the past 2160 hour(s)).  Objective: General: Patient is awake, alert, and oriented x 3 and in no acute distress.  Integument: Skin is warm, dry and supple bilateral. Nails are tender, long, thickened and dystrophic with subungual debris, consistent with onychomycosis, 1-5 bilateral worse at bilateral hallux.  Pinpoint ulceration noted to the dorsal IPJ of the right fifth toe with no surrounding signs of infection likely from rubbing versus patient wearing toe cap that rubbed a sore spot to the area. Remaining integument  unremarkable.  Vasculature:  Dorsalis Pedis pulse 1/4 bilateral. Posterior Tibial pulse 1/4 bilateral.  Capillary fill time <3 sec 1-5 bilateral.  Scant positive hair growth to the level of the digits. Temperature gradient within normal limits.  Moderate varicosities present bilateral. No edema present bilateral.   Neurology: The patient has diminished sensation measured with a 5.07/10g Semmes Weinstein Monofilament at all pedal sites bilateral . Vibratory sensation diminished bilateral with tuning fork. No Babinski sign present bilateral.   Musculoskeletal:Asymptomatic hammertoe and pes planus pedal deformities noted bilateral. Muscular strength 5/5 in all lower extremity muscular groups bilateral without pain on range of motion.  Minimal tenderness to fifth toes bilateral. No tenderness with calf compression bilateral.  Assessment and Plan: Problem List Items Addressed This Visit      Endocrine   Diabetes mellitus without complication ([place]) - Primary    Other Visit Diagnoses    Toe ulcer, right, limited to breakdown of skin (HCC)       Pain due to onychomycosis of toenails of both feet       Bunion       Hammer toes of both feet          -Examined patient. -Discussed and educated patient on diabetic foot care, especially with  regards to the vascular, neurological and musculoskeletal systems.  -Stressed the importance of good glycemic control and the detriment of not  controlling glucose levels in relation to the foot. -Mechanically debrided all nails bilateral hallux using sterile nail nipper and filed with dremel without incident  -Cleanse right fifth toe wound and applied a small amount of Iodosorb and Band-Aid and advised patient to do the same daily and to refrain from wearing toe cap -Answered all patient questions -Patient to return  in 6 to 8 weeks for at risk foot care and advised patient if right fifth toe worsens to return to office sooner -Prescribed gabapentin for  patient to take at bedtime for neuropathic pains -Recommend over-the-counter nail topical for fungus as needed -Patient advised to call the office if any problems or questions arise in the meantime.  Landis Martins, DPM

## 2020-01-27 ENCOUNTER — Other Ambulatory Visit: Payer: Self-pay

## 2020-01-27 ENCOUNTER — Ambulatory Visit (INDEPENDENT_AMBULATORY_CARE_PROVIDER_SITE_OTHER): Payer: Medicare HMO | Admitting: Orthotics

## 2020-01-27 DIAGNOSIS — M2042 Other hammer toe(s) (acquired), left foot: Secondary | ICD-10-CM | POA: Diagnosis not present

## 2020-01-27 DIAGNOSIS — M21619 Bunion of unspecified foot: Secondary | ICD-10-CM

## 2020-01-27 DIAGNOSIS — E1142 Type 2 diabetes mellitus with diabetic polyneuropathy: Secondary | ICD-10-CM | POA: Diagnosis not present

## 2020-01-27 DIAGNOSIS — M2041 Other hammer toe(s) (acquired), right foot: Secondary | ICD-10-CM

## 2020-01-27 DIAGNOSIS — L97511 Non-pressure chronic ulcer of other part of right foot limited to breakdown of skin: Secondary | ICD-10-CM

## 2020-01-27 NOTE — Progress Notes (Signed)

## 2020-02-02 DIAGNOSIS — E1169 Type 2 diabetes mellitus with other specified complication: Secondary | ICD-10-CM | POA: Diagnosis not present

## 2020-02-02 DIAGNOSIS — R6889 Other general symptoms and signs: Secondary | ICD-10-CM | POA: Diagnosis not present

## 2020-02-02 DIAGNOSIS — I10 Essential (primary) hypertension: Secondary | ICD-10-CM | POA: Diagnosis not present

## 2020-02-02 DIAGNOSIS — Z20822 Contact with and (suspected) exposure to covid-19: Secondary | ICD-10-CM | POA: Diagnosis not present

## 2020-02-02 DIAGNOSIS — F3341 Major depressive disorder, recurrent, in partial remission: Secondary | ICD-10-CM | POA: Diagnosis not present

## 2020-02-10 ENCOUNTER — Ambulatory Visit: Payer: Medicare HMO | Admitting: Sports Medicine

## 2020-02-18 DIAGNOSIS — E291 Testicular hypofunction: Secondary | ICD-10-CM | POA: Diagnosis not present

## 2020-02-18 DIAGNOSIS — E785 Hyperlipidemia, unspecified: Secondary | ICD-10-CM | POA: Diagnosis not present

## 2020-02-18 DIAGNOSIS — E039 Hypothyroidism, unspecified: Secondary | ICD-10-CM | POA: Diagnosis not present

## 2020-02-18 DIAGNOSIS — I2510 Atherosclerotic heart disease of native coronary artery without angina pectoris: Secondary | ICD-10-CM | POA: Diagnosis not present

## 2020-02-18 DIAGNOSIS — E1169 Type 2 diabetes mellitus with other specified complication: Secondary | ICD-10-CM | POA: Diagnosis not present

## 2020-02-18 DIAGNOSIS — I10 Essential (primary) hypertension: Secondary | ICD-10-CM | POA: Diagnosis not present

## 2020-02-18 DIAGNOSIS — J309 Allergic rhinitis, unspecified: Secondary | ICD-10-CM | POA: Diagnosis not present

## 2020-02-18 DIAGNOSIS — F3341 Major depressive disorder, recurrent, in partial remission: Secondary | ICD-10-CM | POA: Diagnosis not present

## 2020-03-02 ENCOUNTER — Other Ambulatory Visit: Payer: Self-pay

## 2020-03-02 ENCOUNTER — Encounter: Payer: Self-pay | Admitting: Sports Medicine

## 2020-03-02 ENCOUNTER — Ambulatory Visit (INDEPENDENT_AMBULATORY_CARE_PROVIDER_SITE_OTHER): Payer: Medicare HMO | Admitting: Sports Medicine

## 2020-03-02 DIAGNOSIS — L97511 Non-pressure chronic ulcer of other part of right foot limited to breakdown of skin: Secondary | ICD-10-CM

## 2020-03-02 DIAGNOSIS — E1142 Type 2 diabetes mellitus with diabetic polyneuropathy: Secondary | ICD-10-CM | POA: Diagnosis not present

## 2020-03-02 MED ORDER — GABAPENTIN 300 MG PO CAPS: 300.0000 mg | ORAL_CAPSULE | Freq: Two times a day (BID) | ORAL | 3 refills | Status: DC

## 2020-03-02 NOTE — Progress Notes (Signed)
Subjective: Robert Santiago is a 70 y.o. male patient with history of diabetes present to office for f/u eval of toe pain. Reports that right 5th toe is doing better but has issues with bandaids sometimes and still has a little sharp shooting pain in both feet during the day, gabapentin helps. No other issues noted.  Patient Active Problem List   Diagnosis Date Noted  . Diabetes mellitus without complication ([place])   . Acquired hypothyroidism 03/19/2019  . Atherosclerosis of autologous vein coronary artery bypass graft(s) with unstable angina pectoris ([place]) 03/19/2019  . Encounter for screening for diabetes mellitus 03/19/2019  . Status post cardiac catheterization 03/19/2019  . Type 2 diabetes mellitus without complication ([place]) 63/87/5643  . Coronary artery disease involving native coronary artery of native heart with angina pectoris ([place]) 03/19/2019  . Coronary artery disease of native artery of native heart with stable angina pectoris ([place])   . Hyperlipidemia   . Essential hypertension   . Progressive angina (HCC)-class III 12/19/2018  . COVID-19 08/2018  . Hx of CABG 08/23/2014  . Multiple vessel coronary artery disease 08/21/2014   Current Outpatient Medications on File Prior to Visit  Medication Sig Dispense Refill  . acetaminophen (TYLENOL) 500 MG tablet Take 1,000 mg by mouth every 6 (six) hours as needed for moderate pain or headache.    . ALPRAZolam (XANAX) 0.25 MG tablet Take 0.25 mg by mouth daily as needed for anxiety.     Marland Kitchen aspirin EC 81 MG tablet Take 81 mg by mouth daily.    . BD PEN NEEDLE NANO U/F 32G X 4 MM MISC     . benazepril (LOTENSIN) 20 MG tablet Take 20 mg by mouth daily.    . citalopram (CELEXA) 40 MG tablet Take 40 mg by mouth daily.    . clopidogrel (PLAVIX) 75 MG tablet Take 1 tablet (75 mg total) by mouth daily. 90 tablet 3  . clotrimazole-betamethasone (LOTRISONE) cream Apply 1 application topically daily as needed for rash.    . ezetimibe (ZETIA) 10 MG  tablet Take 1 tablet (10 mg total) by mouth daily. 90 tablet 3  . fluticasone (FLONASE) 50 MCG/ACT nasal spray Place 1 spray into both nostrils daily as needed for allergies or rhinitis.    Marland Kitchen glimepiride (AMARYL) 4 MG tablet Take 4 mg by mouth 2 (two) times daily.     Marland Kitchen LANTUS SOLOSTAR 100 UNIT/ML Solostar Pen 40 Units daily.    Marland Kitchen levothyroxine (SYNTHROID) 175 MCG tablet Take 175 mcg by mouth daily.     . meloxicam (MOBIC) 15 MG tablet     . metFORMIN (GLUCOPHAGE) 850 MG tablet Take 850 mg by mouth 2 (two) times daily.     . metoprolol tartrate (LOPRESSOR) 25 MG tablet Take 25 mg by mouth 2 (two) times daily.     . nitroGLYCERIN (NITROSTAT) 0.4 MG SL tablet Place 1 tablet (0.4 mg total) under the tongue every 5 (five) minutes as needed for chest pain. 25 tablet 3  . omeprazole (PRILOSEC) 40 MG capsule Take 40 mg by mouth daily.    . rosuvastatin (CRESTOR) 10 MG tablet Take 1 tablet (10 mg total) by mouth daily. 90 tablet 3  . Tetrahydrozoline HCl (VISINE OP) Place 1 drop into both eyes at bedtime as needed (irritation).     No current facility-administered medications on file prior to visit.   No Known Allergies  No results found for this or any previous visit (from the past 2160 hour(s)).  Objective:  General: Patient is awake, alert, and oriented x 3 and in no acute distress.  Integument: Skin is warm, dry and supple bilateral. Nails are short, thickened, and dystrophic with subungual debris, consistent with onychomycosis, 1-5 bilateral worse at bilateral hallux.  Pinpoint ulceration noted to the dorsal IPJ of the right fifth toe with no surrounding signs of infection, improving in nature. Remaining integument unremarkable.  Vasculature:  Dorsalis Pedis pulse 1/4 bilateral. Posterior Tibial pulse 1/4 bilateral.  Capillary fill time <3 sec 1-5 bilateral.  Scant positive hair growth to the level of the digits. Temperature gradient within normal limits.  Moderate varicosities present  bilateral. No edema present bilateral.   Neurology: The patient has diminished sensation measured with a 5.07/10g Semmes Weinstein Monofilament at all pedal sites bilateral . Vibratory sensation diminished bilateral with tuning fork. No Babinski sign present bilateral.   Musculoskeletal:Asymptomatic hammertoe and pes planus pedal deformities noted bilateral. Muscular strength 5/5 in all lower extremity muscular groups bilateral without pain on range of motion.  Minimal tenderness to fifth toes bilateral. No tenderness with calf compression bilateral.  Assessment and Plan: Problem List Items Addressed This Visit   None   Visit Diagnoses    Toe ulcer, right, limited to breakdown of skin ([place])    -  Primary   Diabetic polyneuropathy associated with type 2 diabetes mellitus (HCC)       Relevant Medications   gabapentin (NEURONTIN) 300 MG capsule       -Examined patient. -Discussed continue care for right 5th toe and neuropathy -Cleansed right fifth toe wound and applied a Band-Aid and advised patient to do the same daily and to refrain from ointments or creams  -Increased gabapentin for patient to take at bedtime for neuropathic pains to 300mg  BID -Recommend to return to office in 4-6 weeks for recheck -Awaiting diabetic shoes -Patient advised to call the office if any problems or questions arise in the meantime.  Landis Martins, DPM

## 2020-04-03 NOTE — Progress Notes (Unsigned)
Cardiology Office Note:    Date:  04/04/2020   ID:  Robert Santiago, DOB Jun 02, 1950, MRN [id number]  PCP:  [name], MD  Cardiologist:  [name], MD    Referring MD: [name], MD    ASSESSMENT:    1. Coronary artery disease of native artery of native heart with stable angina pectoris ([place])   2. Essential hypertension   3. Mixed hyperlipidemia   4. LV dysfunction    PLAN:    In order of problems listed above:  1. He is doing well having no angina current medical therapy following bypass surgery and graft occlusions continue medical therapy including long-term dual antiplatelet lipid-lowering with a high intensity statin beta-blocker. 2. Stable BP at target with mild LV dysfunction on guideline directed therapy including beta-blocker ACE inhibitor. 3. Stable lipids at target continue combined therapy Crestor and Zetia LDL at target 4. Stable continue beta-blocker ACE inhibitor with CAD   Next appointment: 6 months   Medication Adjustments/Labs and Tests Ordered: Current medicines are reviewed at length with the patient today.  Concerns regarding medicines are outlined above.  Orders Placed This Encounter  Procedures  . EKG 12-Lead   No orders of the defined types were placed in this encounter.   Chief Complaint  Patient presents with  . Follow-up  . Coronary Artery Disease    History of Present Illness:     Robert Santiago is a 70 y.o. male with a hx of coronary artery disease coronary artery bypass surgery 08/23/2014 with a left thoracic artery anastomosed to the left anterior descending vein graft to the first diagonal vein graft to the ramus and vein graft to an obtuse marginal and vein graft to the right sided posterior lateral branch.  Other problems include hypertension hyperlipidemia and superficial thrombophlebitis of lower extremity.  He seen 12/19/2018.  He was having typical exertional anginal equivalent dyspnea and underwent myocardial perfusion study  for risk stratification.  The study was reported as abnormal and with shared decision making he was referred for coronary angiogram coronary angiography performed 02/08/1998 2102 of his 5 grafts to be occluded with patent left thoracic artery to the LAD and a vein graft to the diagonal and marginal branch. The ejection fraction of the range of 45% with lateral hypokinesia he had severe native coronary artery disease with subtotal occlusion of left main left anterior descending ramus and left circumflex trifurcation. He was felt to be best treated medically.   He was last seen 10/06/2019.  Compliance with diet, lifestyle and medications: Yes  From a cardiology perspective he continues to do well he has no angina shortness of breath chest pain palpitation or syncope.  Diabetes better controlled FBS less than 150.  Tolerates statin without muscle pain or weakness.  He does have trouble with gait and balance related to his diabetes and neuropathy. Past Medical History:  Diagnosis Date  . Acquired hypothyroidism 03/19/2019  . Atherosclerosis of autologous vein coronary artery bypass graft(s) with unstable angina pectoris ([place]) 03/19/2019  . Coronary artery disease involving native coronary artery of native heart with angina pectoris ([place]) 03/19/2019   Cath 08/20/14: Conclusions Diagnostic Procedure Summary 1. Severe, diffuse multi-vessel CAD 2. Normal LV function Diagnostic Procedure Recommendations CTS consult Angiographic findings Cardiac Arteries and Lesion Findings LMCA: Normal. LAD: Lesion on Prox LAD: 80% stenosis. Lesion on Dist LAD: 80% stenosis. LCx: Lesion on Prox CX: Proximal subsection.80% stenosis. Lesion on 1st Ob Marg: 80% stenos  . Coronary artery disease of  native artery of native heart with stable angina pectoris ([place])   . COVID-19 08/2018  . Diabetes mellitus without complication ([place])   . Encounter for screening for diabetes mellitus 03/19/2019   IMOUPDATE  . Essential hypertension   . Hx  of CABG 08/23/2014   (Dr. Darryl Nestle) CABG x5: LIMA-LAD, SVG-D1, SVG-RI, SVG-OM 1, SVG-RPL.  Marland Kitchen Hyperlipidemia   . Multiple vessel coronary artery disease 08/21/2014   Cath ([place]): Right dominant -RCA: Proximal 95, mid 95, distal 95.  LM-normal.  LAD: Proximal 80%, distal 80%; LCx: Proximal 80%, OM1 80%. -Normal EF. -->  Referred for CABG  . Progressive angina (HCC)-class III 12/19/2018  . Status post cardiac catheterization 03/19/2019  . Type 2 diabetes mellitus without complication ([place]) 03/17/3555    Past Surgical History:  Procedure Laterality Date  . ACOUSTIC NEUROMA RESECTION    . APPENDECTOMY    . CARDIAC CATHETERIZATION  08/21/2014   Right dominant -RCA: Proximal 95, mid 95, distal 95.  LM-normal.  LAD: Proximal 80%, distal 80%; LCx: Proximal 80%, OM1 80%. -Normal EF. -->  Referred for CABG  . CORONARY ARTERY BYPASS GRAFT  08/23/2014   (Dr. Darryl Nestle) CABG x5: LIMA-LAD, SVG-D1, SVG-RI, SVG-OM 1, SVG-RPL.  Marland Kitchen LEFT HEART CATH AND CORS/GRAFTS ANGIOGRAPHY N/A 02/09/2019   Procedure: LEFT HEART CATH AND CORS/GRAFTS ANGIOGRAPHY;  Surgeon: Leonie Man, MD;  Location: [facility];  Service: Cardiovascular;  Laterality: N/A;  . TRANSTHORACIC ECHOCARDIOGRAM  08/2014   ([place]) mild LVH, EF 55% with no R WMA.  Normal valves.    Current Medications: Current Meds  Medication Sig  . acetaminophen (TYLENOL) 500 MG tablet Take 1,000 mg by mouth every 6 (six) hours as needed for moderate pain or headache.  . ALPRAZolam (XANAX) 0.25 MG tablet Take 0.25 mg by mouth daily as needed for anxiety.   Marland Kitchen aspirin EC 81 MG tablet Take 81 mg by mouth daily.  . BD PEN NEEDLE NANO U/F 32G X 4 MM MISC   . benazepril (LOTENSIN) 20 MG tablet Take 20 mg by mouth daily.  . citalopram (CELEXA) 40 MG tablet Take 40 mg by mouth daily.  . clopidogrel (PLAVIX) 75 MG tablet Take 1 tablet (75 mg total) by mouth daily.  . clotrimazole-betamethasone (LOTRISONE) cream Apply 1 application  topically daily as needed for rash.  . fluticasone (FLONASE) 50 MCG/ACT nasal spray Place 1 spray into both nostrils daily as needed for allergies or rhinitis.  Marland Kitchen gabapentin (NEURONTIN) 300 MG capsule Take 1 capsule (300 mg total) by mouth 2 (two) times daily.  Marland Kitchen glimepiride (AMARYL) 4 MG tablet Take 4 mg by mouth 2 (two) times daily.   Marland Kitchen LANTUS SOLOSTAR 100 UNIT/ML Solostar Pen 40 Units daily.  Marland Kitchen levothyroxine (SYNTHROID) 175 MCG tablet Take 175 mcg by mouth daily.   . meloxicam (MOBIC) 15 MG tablet   . metFORMIN (GLUCOPHAGE) 850 MG tablet Take 850 mg by mouth 2 (two) times daily.   . metoprolol tartrate (LOPRESSOR) 25 MG tablet Take 25 mg by mouth 2 (two) times daily.   Marland Kitchen omeprazole (PRILOSEC) 40 MG capsule Take 40 mg by mouth daily.  . Tetrahydrozoline HCl (VISINE OP) Place 1 drop into both eyes at bedtime as needed (irritation).     Allergies:   Patient has no known allergies.   Social History   Socioeconomic History  . Marital status: Married    Spouse name: Not on file  . Number of children: Not on file  .  Years of education: Not on file  . Highest education level: Not on file  Occupational History  . Not on file  Tobacco Use  . Smoking status: Former Smoker    Packs/day: 1.00    Years: 30.00    Pack years: 30.00    Types: Cigarettes    Quit date: 2003    Years since quitting: 19.2  . Smokeless tobacco: Never Used  Vaping Use  . Vaping Use: Never used  Substance and Sexual Activity  . Alcohol use: Yes    Comment: occ  . Drug use: Never  . Sexual activity: Not on file  Other Topics Concern  . Not on file  Social History Narrative  . Not on file   Social Determinants of Health   Financial Resource Strain: Not on file  Food Insecurity: Not on file  Transportation Needs: Not on file  Physical Activity: Not on file  Stress: Not on file  Social Connections: Not on file     Family History: The patient's family history includes COPD in his mother; Heart attack  in his brother; Heart disease in his brother, brother, brother, and father; Heart failure in his mother; Hyperlipidemia in his father and mother; Hypertension in his father and mother. ROS:   Please see the history of present illness.    All other systems reviewed and are negative.  EKGs/Labs/Other Studies Reviewed:    The following studies were reviewed today:  EKG:  EKG ordered today and personally reviewed.  The ekg ordered today demonstrates sinus rhythm right bundle branch block old inferior MI  Recent Labs: 12/24/2019 cholesterol 143 LDL at target 78 triglycerides 101 HDL 46 A1c 8.8% creatinine normal 0.94  Physical Exam:    VS:  BP 130/70 (BP Location: Right Arm, Patient Position: Sitting, Cuff Size: Normal)   Pulse 65   Ht 6' (1.829 m)   Wt 292 lb 12.8 oz (132.8 kg)   SpO2 98%   BMI 39.71 kg/m     Wt Readings from Last 3 Encounters:  04/04/20 292 lb 12.8 oz (132.8 kg)  10/06/19 290 lb 12.8 oz (131.9 kg)  03/27/19 277 lb (125.6 kg)     GEN: BMI approaches 40 well nourished, well developed in no acute distress HEENT: Normal NECK: No JVD; No carotid bruits LYMPHATICS: No lymphadenopathy CARDIAC: RRR, no murmurs, rubs, gallops RESPIRATORY:  Clear to auscultation without rales, wheezing or rhonchi  ABDOMEN: Soft, non-tender, non-distended MUSCULOSKELETAL:  No edema; No deformity  SKIN: Warm and dry NEUROLOGIC:  Alert and oriented x 3 PSYCHIATRIC:  Normal affect    Signed, [name], MD  04/04/2020 9:25 AM    [facility]

## 2020-04-04 ENCOUNTER — Ambulatory Visit: Payer: Medicare HMO | Admitting: Cardiology

## 2020-04-04 ENCOUNTER — Encounter: Payer: Self-pay | Admitting: Cardiology

## 2020-04-04 ENCOUNTER — Other Ambulatory Visit: Payer: Self-pay

## 2020-04-04 VITALS — BP 130/70 | HR 65 | Ht 72.0 in | Wt 292.8 lb

## 2020-04-04 DIAGNOSIS — I519 Heart disease, unspecified: Secondary | ICD-10-CM

## 2020-04-04 DIAGNOSIS — E1140 Type 2 diabetes mellitus with diabetic neuropathy, unspecified: Secondary | ICD-10-CM | POA: Diagnosis not present

## 2020-04-04 DIAGNOSIS — I25118 Atherosclerotic heart disease of native coronary artery with other forms of angina pectoris: Secondary | ICD-10-CM

## 2020-04-04 DIAGNOSIS — E782 Mixed hyperlipidemia: Secondary | ICD-10-CM

## 2020-04-04 DIAGNOSIS — I10 Essential (primary) hypertension: Secondary | ICD-10-CM | POA: Diagnosis not present

## 2020-04-04 NOTE — Patient Instructions (Signed)

## 2020-04-06 ENCOUNTER — Telehealth: Payer: Self-pay | Admitting: Cardiology

## 2020-04-06 ENCOUNTER — Ambulatory Visit: Payer: Medicare HMO | Admitting: Sports Medicine

## 2020-04-06 ENCOUNTER — Other Ambulatory Visit: Payer: Self-pay

## 2020-04-06 DIAGNOSIS — L97511 Non-pressure chronic ulcer of other part of right foot limited to breakdown of skin: Secondary | ICD-10-CM | POA: Diagnosis not present

## 2020-04-06 DIAGNOSIS — E1142 Type 2 diabetes mellitus with diabetic polyneuropathy: Secondary | ICD-10-CM

## 2020-04-06 MED ORDER — CLOPIDOGREL BISULFATE 75 MG PO TABS: 75.0000 mg | ORAL_TABLET | Freq: Every day | ORAL | 3 refills | Status: DC

## 2020-04-06 NOTE — Progress Notes (Signed)
Subjective: Robert Santiago is a 70 y.o. male patient with history of diabetes present to office for f/u eval of toe pain. Reports that right 5th toe is much better but there are days with it hurts more and opens, pain 3-4/10, using bandaid but not sure if he's getting it in the right spot, denies swelling redness or warmth. Admits that Gabapentin is not doing much that he can notice, even with 300 BID still has sharp shooting pains to feet. No other issues noted.  FBS 137  Patient Active Problem List   Diagnosis Date Noted  . Diabetes mellitus without complication ([place])   . Acquired hypothyroidism 03/19/2019  . Atherosclerosis of autologous vein coronary artery bypass graft(s) with unstable angina pectoris ([place]) 03/19/2019  . Encounter for screening for diabetes mellitus 03/19/2019  . Status post cardiac catheterization 03/19/2019  . Type 2 diabetes mellitus without complication ([place]) 19/41/7408  . Coronary artery disease involving native coronary artery of native heart with angina pectoris ([place]) 03/19/2019  . Coronary artery disease of native artery of native heart with stable angina pectoris ([place])   . Hyperlipidemia   . Essential hypertension   . Progressive angina (HCC)-class III 12/19/2018  . COVID-19 08/2018  . Hx of CABG 08/23/2014  . Multiple vessel coronary artery disease 08/21/2014   Current Outpatient Medications on File Prior to Visit  Medication Sig Dispense Refill  . acetaminophen (TYLENOL) 500 MG tablet Take 1,000 mg by mouth every 6 (six) hours as needed for moderate pain or headache.    . ALPRAZolam (XANAX) 0.25 MG tablet Take 0.25 mg by mouth daily as needed for anxiety.     Marland Kitchen aspirin EC 81 MG tablet Take 81 mg by mouth daily.    . BD PEN NEEDLE NANO U/F 32G X 4 MM MISC     . benazepril (LOTENSIN) 20 MG tablet Take 20 mg by mouth daily.    . citalopram (CELEXA) 40 MG tablet Take 40 mg by mouth daily.    . clopidogrel (PLAVIX) 75 MG tablet Take 1 tablet (75 mg total) by  mouth daily. 90 tablet 3  . clotrimazole-betamethasone (LOTRISONE) cream Apply 1 application topically daily as needed for rash.    . ezetimibe (ZETIA) 10 MG tablet Take 1 tablet (10 mg total) by mouth daily. 90 tablet 3  . fluticasone (FLONASE) 50 MCG/ACT nasal spray Place 1 spray into both nostrils daily as needed for allergies or rhinitis.    Marland Kitchen gabapentin (NEURONTIN) 300 MG capsule Take 1 capsule (300 mg total) by mouth 2 (two) times daily. 90 capsule 3  . glimepiride (AMARYL) 4 MG tablet Take 4 mg by mouth 2 (two) times daily.     Marland Kitchen LANTUS SOLOSTAR 100 UNIT/ML Solostar Pen 40 Units daily.    Marland Kitchen levothyroxine (SYNTHROID) 175 MCG tablet Take 175 mcg by mouth daily.     . meloxicam (MOBIC) 15 MG tablet     . metFORMIN (GLUCOPHAGE) 850 MG tablet Take 850 mg by mouth 2 (two) times daily.     . metoprolol tartrate (LOPRESSOR) 25 MG tablet Take 25 mg by mouth 2 (two) times daily.     . nitroGLYCERIN (NITROSTAT) 0.4 MG SL tablet Place 1 tablet (0.4 mg total) under the tongue every 5 (five) minutes as needed for chest pain. 25 tablet 3  . omeprazole (PRILOSEC) 40 MG capsule Take 40 mg by mouth daily.    . rosuvastatin (CRESTOR) 10 MG tablet Take 1 tablet (10 mg total) by mouth daily.  90 tablet 3  . Tetrahydrozoline HCl (VISINE OP) Place 1 drop into both eyes at bedtime as needed (irritation).     No current facility-administered medications on file prior to visit.   No Known Allergies  No results found for this or any previous visit (from the past 2160 hour(s)).  Objective: General: Patient is awake, alert, and oriented x 3 and in no acute distress.  Integument: Skin is warm, dry and supple bilateral. Nails are short, thickened, and dystrophic with subungual debris, consistent with onychomycosis, 1-5 bilateral worse at bilateral hallux.  + pinpoint ulceration at right 5th toe with no surrounding signs of infection.    Neurovascular status unchanged from prior  Musculoskeletal: Minimal  tenderness to fifth toe on right. + Hammertoe with varus rotation.   Assessment and Plan: Problem List Items Addressed This Visit   None   Visit Diagnoses    Toe ulcer, right, limited to breakdown of skin ([place] [address])    -  Primary   Diabetic polyneuropathy associated with type 2 diabetes mellitus ([place])          -Examined patient. -Re-Discussed continue care for right 5th toe and neuropathy -Applied lumicaine to right 5th toe and advised patient to use liquid bandaid to the area twice a week -Advised patient if symptoms worsen to return to office sooner -Continue with Gabapentin 300mg  BID and advised patient to discuss with PCP increasing  -Awaiting diabetic shoes -Patient advised to call the office if any problems or questions arise in the meantime. -Return in 4-6 weeks for toe check and advised patient to consider in office (over in [place]) arthroplasty of toe if fails to continue to improve.   Landis Martins, DPM

## 2020-04-06 NOTE — Telephone Encounter (Signed)
   *  STAT* If patient is at the pharmacy, call can be transferred to refill team.   1. Which medications need to be refilled? (please list name of each medication and dose if known)   clopidogrel (PLAVIX) 75 MG tablet    2. Which pharmacy/location (including street and city if local pharmacy) is medication to be sent to?[facility], [place].  3. Do they need a 30 day or 90 day supply? 90 days

## 2020-04-06 NOTE — Telephone Encounter (Signed)
Refills sent in per request 

## 2020-04-18 ENCOUNTER — Telehealth: Payer: Self-pay | Admitting: Sports Medicine

## 2020-04-18 NOTE — Telephone Encounter (Signed)
Pt called stating his toe was worse redness, warn to touch and red line going half way up leg.  Spoke with Lisa-pt was advised to go to ED to rule out DVT

## 2020-04-18 NOTE — Telephone Encounter (Signed)
Ok I will see him as scheduled on Wednesday Thanks Dr. Chauncey Cruel

## 2020-04-18 NOTE — Telephone Encounter (Signed)
Pt called back stating he would still like to make appt.  Stated toe/line is no longer red and is much better than it was this wkend.  Advised he was not going to ED unless it came back like it was.

## 2020-04-18 NOTE — Telephone Encounter (Signed)
Pt called stating his toe

## 2020-04-19 DIAGNOSIS — I10 Essential (primary) hypertension: Secondary | ICD-10-CM | POA: Diagnosis not present

## 2020-04-19 DIAGNOSIS — M7731 Calcaneal spur, right foot: Secondary | ICD-10-CM | POA: Diagnosis not present

## 2020-04-19 DIAGNOSIS — M79604 Pain in right leg: Secondary | ICD-10-CM | POA: Diagnosis not present

## 2020-04-19 DIAGNOSIS — L089 Local infection of the skin and subcutaneous tissue, unspecified: Secondary | ICD-10-CM | POA: Diagnosis not present

## 2020-04-19 DIAGNOSIS — E11628 Type 2 diabetes mellitus with other skin complications: Secondary | ICD-10-CM | POA: Diagnosis not present

## 2020-04-19 DIAGNOSIS — M19071 Primary osteoarthritis, right ankle and foot: Secondary | ICD-10-CM | POA: Diagnosis not present

## 2020-04-19 DIAGNOSIS — Z951 Presence of aortocoronary bypass graft: Secondary | ICD-10-CM | POA: Diagnosis not present

## 2020-04-19 DIAGNOSIS — M7989 Other specified soft tissue disorders: Secondary | ICD-10-CM | POA: Diagnosis not present

## 2020-04-19 DIAGNOSIS — E079 Disorder of thyroid, unspecified: Secondary | ICD-10-CM | POA: Diagnosis not present

## 2020-04-19 DIAGNOSIS — I2510 Atherosclerotic heart disease of native coronary artery without angina pectoris: Secondary | ICD-10-CM | POA: Diagnosis not present

## 2020-04-19 DIAGNOSIS — R600 Localized edema: Secondary | ICD-10-CM | POA: Diagnosis not present

## 2020-04-19 DIAGNOSIS — E7800 Pure hypercholesterolemia, unspecified: Secondary | ICD-10-CM | POA: Diagnosis not present

## 2020-04-20 ENCOUNTER — Ambulatory Visit: Payer: Medicare HMO | Admitting: Sports Medicine

## 2020-04-20 ENCOUNTER — Other Ambulatory Visit: Payer: Self-pay

## 2020-04-20 ENCOUNTER — Encounter: Payer: Self-pay | Admitting: Sports Medicine

## 2020-04-20 DIAGNOSIS — L02611 Cutaneous abscess of right foot: Secondary | ICD-10-CM | POA: Diagnosis not present

## 2020-04-20 DIAGNOSIS — E1142 Type 2 diabetes mellitus with diabetic polyneuropathy: Secondary | ICD-10-CM

## 2020-04-20 DIAGNOSIS — L97511 Non-pressure chronic ulcer of other part of right foot limited to breakdown of skin: Secondary | ICD-10-CM

## 2020-04-20 DIAGNOSIS — L03031 Cellulitis of right toe: Secondary | ICD-10-CM | POA: Diagnosis not present

## 2020-04-20 NOTE — Addendum Note (Signed)
Addended by: Allean Found on: 04/20/2020 01:05 PM   Modules accepted: Orders

## 2020-04-20 NOTE — Progress Notes (Signed)
Subjective: Robert Santiago is a 70 y.o. male patient with history of diabetes present to office for f/u eval of toe pain and infection. Reports that he went to ER on yesterday and had x-rays which were negative.  Patient reports that he was given a prescription for clindamycin but has not picked it up yet and states that he has a lot of swelling and redness and pain in the lower extremity.  Patient admits to a previous history in the past of infection before on this baby toe joint but never this bad.  Patient is assisted by his wife this visit.  Denies nausea vomiting fever or chills but admits swelling and increased pain in the right lower extremity.  Patient also reports that at the ER they did an ultrasound which was negative no issues with a blood clot.  No other issues noted.  FBS 197  Patient Active Problem List   Diagnosis Date Noted  . Diabetes mellitus without complication ([place])   . Acquired hypothyroidism 03/19/2019  . Atherosclerosis of autologous vein coronary artery bypass graft(s) with unstable angina pectoris ([place]) 03/19/2019  . Encounter for screening for diabetes mellitus 03/19/2019  . Status post cardiac catheterization 03/19/2019  . Type 2 diabetes mellitus without complication ([place]) 78/46/9629  . Coronary artery disease involving native coronary artery of native heart with angina pectoris ([place]) 03/19/2019  . Coronary artery disease of native artery of native heart with stable angina pectoris ([place])   . Hyperlipidemia   . Essential hypertension   . Progressive angina (HCC)-class III 12/19/2018  . COVID-19 08/2018  . Hx of CABG 08/23/2014  . Multiple vessel coronary artery disease 08/21/2014   Current Outpatient Medications on File Prior to Visit  Medication Sig Dispense Refill  . acetaminophen (TYLENOL) 500 MG tablet Take 1,000 mg by mouth every 6 (six) hours as needed for moderate pain or headache.    . ALPRAZolam (XANAX) 0.25 MG tablet Take 0.25 mg by mouth daily as  needed for anxiety.     Marland Kitchen aspirin EC 81 MG tablet Take 81 mg by mouth daily.    . BD PEN NEEDLE NANO U/F 32G X 4 MM MISC     . benazepril (LOTENSIN) 20 MG tablet Take 20 mg by mouth daily.    . citalopram (CELEXA) 40 MG tablet Take 40 mg by mouth daily.    . clopidogrel (PLAVIX) 75 MG tablet Take 1 tablet (75 mg total) by mouth daily. 90 tablet 3  . clotrimazole-betamethasone (LOTRISONE) cream Apply 1 application topically daily as needed for rash.    . ezetimibe (ZETIA) 10 MG tablet Take 1 tablet (10 mg total) by mouth daily. 90 tablet 3  . fluticasone (FLONASE) 50 MCG/ACT nasal spray Place 1 spray into both nostrils daily as needed for allergies or rhinitis.    Marland Kitchen gabapentin (NEURONTIN) 300 MG capsule Take 1 capsule (300 mg total) by mouth 2 (two) times daily. 90 capsule 3  . glimepiride (AMARYL) 4 MG tablet Take 4 mg by mouth 2 (two) times daily.     Marland Kitchen LANTUS SOLOSTAR 100 UNIT/ML Solostar Pen 40 Units daily.    Marland Kitchen levothyroxine (SYNTHROID) 175 MCG tablet Take 175 mcg by mouth daily.     . meloxicam (MOBIC) 15 MG tablet     . metFORMIN (GLUCOPHAGE) 850 MG tablet Take 850 mg by mouth 2 (two) times daily.     . metoprolol tartrate (LOPRESSOR) 25 MG tablet Take 25 mg by mouth 2 (two) times daily.     Marland Kitchen  nitroGLYCERIN (NITROSTAT) 0.4 MG SL tablet Place 1 tablet (0.4 mg total) under the tongue every 5 (five) minutes as needed for chest pain. 25 tablet 3  . omeprazole (PRILOSEC) 40 MG capsule Take 40 mg by mouth daily.    . rosuvastatin (CRESTOR) 10 MG tablet Take 1 tablet (10 mg total) by mouth daily. 90 tablet 3  . Tetrahydrozoline HCl (VISINE OP) Place 1 drop into both eyes at bedtime as needed (irritation).     No current facility-administered medications on file prior to visit.   No Known Allergies  No results found for this or any previous visit (from the past 2160 hour(s)).  Objective: General: Patient is awake, alert, and oriented x 3 and in no acute distress.  Integument: Skin is  warm, dry and supple bilateral. Nails are short, thickened, and dystrophic with subungual debris, consistent with onychomycosis, 1-5 bilateral worse at bilateral hallux.  There is a less than 0.2 cm ulceration to the dorsal fifth toe that probes to deep soft tissue with seropurulent drainage and localized redness and diffuse swelling to the entire lower extremity with warmth consistent with cellulitis.  Neurovascular status unchanged from prior  Musculoskeletal: Moderate tenderness to palpation to right fifth toe and right leg. + Hammertoe with varus rotation.   Assessment and Plan: Problem List Items Addressed This Visit   None   Visit Diagnoses    Cellulitis and abscess of toe of right foot    -  Primary   Relevant Orders   CBC with Differential   Basic Metabolic Panel   Sedimentation Rate   C-reactive protein   Toe ulcer, right, limited to breakdown of skin (HCC)       Diabetic polyneuropathy associated with type 2 diabetes mellitus ([place])          -Examined patient. -Wound culture was obtained advised patient to start clindamycin antibiotics right away we will call him if there is a need for change of antibiotics -Flushed right fifth toe wound with Betadine and dressed with dry dressing and advised patient to do the same each day -Dispensed a surgical shoe for patient to wear as instructed and continue with rest and elevation to assist with pain and edema control -Ordered blood work and advised patient if this blood work is abnormal or very significantly high where I think the oral antibiotics may not benefit him we will call him to let him know if he needs to return to the hospital to be admitted for IV antibiotics for the cellulitis however at this time since patient has not tried oral antibiotics we will see if this will be of some help to keep the infection from worsening -X-rays performed on yesterday reviewed with no suspicion of osteomyelitis -Patient advised to call the office  if any problems or questions arise in the meantime. -Return in 1 week or sooner if problems or issues arise.  Landis Martins, DPM

## 2020-04-22 DIAGNOSIS — L02611 Cutaneous abscess of right foot: Secondary | ICD-10-CM | POA: Diagnosis not present

## 2020-04-22 DIAGNOSIS — L03031 Cellulitis of right toe: Secondary | ICD-10-CM | POA: Diagnosis not present

## 2020-04-23 LAB — CBC WITH DIFFERENTIAL/PLATELET
Basophils Absolute: 0.1 10*3/uL (ref 0.0–0.2)
Basos: 1 %
EOS (ABSOLUTE): 0.7 10*3/uL — ABNORMAL HIGH (ref 0.0–0.4)
Eos: 8 %
Hematocrit: 43.2 % (ref 37.5–51.0)
Hemoglobin: 14.3 g/dL (ref 13.0–17.7)
Immature Grans (Abs): 0 10*3/uL (ref 0.0–0.1)
Immature Granulocytes: 0 %
Lymphocytes Absolute: 1.1 10*3/uL (ref 0.7–3.1)
Lymphs: 12 %
MCH: 29.5 pg (ref 26.6–33.0)
MCHC: 33.1 g/dL (ref 31.5–35.7)
MCV: 89 fL (ref 79–97)
Monocytes Absolute: 0.9 10*3/uL (ref 0.1–0.9)
Monocytes: 10 %
Neutrophils Absolute: 6.4 10*3/uL (ref 1.4–7.0)
Neutrophils: 69 %
Platelets: 286 10*3/uL (ref 150–450)
RBC: 4.85 x10E6/uL (ref 4.14–5.80)
RDW: 12.6 % (ref 11.6–15.4)
WBC: 9.2 10*3/uL (ref 3.4–10.8)

## 2020-04-23 LAB — BASIC METABOLIC PANEL
BUN/Creatinine Ratio: 16 (ref 10–24)
BUN: 15 mg/dL (ref 8–27)
CO2: 22 mmol/L (ref 20–29)
Calcium: 9.6 mg/dL (ref 8.6–10.2)
Chloride: 101 mmol/L (ref 96–106)
Creatinine, Ser: 0.91 mg/dL (ref 0.76–1.27)
Glucose: 149 mg/dL — ABNORMAL HIGH (ref 65–99)
Potassium: 4.6 mmol/L (ref 3.5–5.2)
Sodium: 141 mmol/L (ref 134–144)
eGFR: 91 mL/min/{1.73_m2} (ref >59)

## 2020-04-23 LAB — C-REACTIVE PROTEIN: CRP: 39 mg/L — ABNORMAL HIGH (ref 0–10)

## 2020-04-23 LAB — SEDIMENTATION RATE: Sed Rate: 19 mm/hr (ref 0–30)

## 2020-04-26 LAB — WOUND CULTURE

## 2020-04-27 ENCOUNTER — Other Ambulatory Visit: Payer: Self-pay

## 2020-04-27 ENCOUNTER — Ambulatory Visit: Payer: Medicare HMO | Admitting: Sports Medicine

## 2020-04-27 DIAGNOSIS — L03031 Cellulitis of right toe: Secondary | ICD-10-CM | POA: Diagnosis not present

## 2020-04-27 DIAGNOSIS — E1142 Type 2 diabetes mellitus with diabetic polyneuropathy: Secondary | ICD-10-CM

## 2020-04-27 DIAGNOSIS — L02611 Cutaneous abscess of right foot: Secondary | ICD-10-CM

## 2020-04-27 DIAGNOSIS — L97511 Non-pressure chronic ulcer of other part of right foot limited to breakdown of skin: Secondary | ICD-10-CM | POA: Diagnosis not present

## 2020-04-27 MED ORDER — NUZYRA 150 MG PO TABS: 2.0000 | ORAL_TABLET | Freq: Every day | ORAL | 0 refills | Status: AC

## 2020-04-27 NOTE — Progress Notes (Signed)
Subjective: Robert Santiago is a 70 y.o. male patient with history of diabetes returns office for follow-up evaluation of right fifth toe infection.  Patient reports that he got his antibiotics but then developed itchy rash and cut back on how he was taking it states that since he cut back the rash has resolved and he is not sure if it is the antibiotic but he thinks so.  Denies nausea vomiting fever chills or any other constitutional symptoms does admit to bleeding from the right fifth toe and reports that the swelling and redness is a little bit better.  FBS not recorded  Patient Active Problem List   Diagnosis Date Noted  . Diabetes mellitus without complication ([place])   . Acquired hypothyroidism 03/19/2019  . Atherosclerosis of autologous vein coronary artery bypass graft(s) with unstable angina pectoris ([place]) 03/19/2019  . Encounter for screening for diabetes mellitus 03/19/2019  . Status post cardiac catheterization 03/19/2019  . Type 2 diabetes mellitus without complication ([place]) 62/03/5595  . Coronary artery disease involving native coronary artery of native heart with angina pectoris ([place]) 03/19/2019  . Coronary artery disease of native artery of native heart with stable angina pectoris ([place])   . Hyperlipidemia   . Essential hypertension   . Progressive angina (HCC)-class III 12/19/2018  . COVID-19 08/2018  . Hx of CABG 08/23/2014  . Multiple vessel coronary artery disease 08/21/2014   Current Outpatient Medications on File Prior to Visit  Medication Sig Dispense Refill  . acetaminophen (TYLENOL) 500 MG tablet Take 1,000 mg by mouth every 6 (six) hours as needed for moderate pain or headache.    . ALPRAZolam (XANAX) 0.25 MG tablet Take 0.25 mg by mouth daily as needed for anxiety.     Marland Kitchen aspirin EC 81 MG tablet Take 81 mg by mouth daily.    . BD PEN NEEDLE NANO U/F 32G X 4 MM MISC     . benazepril (LOTENSIN) 20 MG tablet Take 20 mg by mouth daily.    . citalopram (CELEXA) 40 MG  tablet Take 40 mg by mouth daily.    . clopidogrel (PLAVIX) 75 MG tablet Take 1 tablet (75 mg total) by mouth daily. 90 tablet 3  . clotrimazole-betamethasone (LOTRISONE) cream Apply 1 application topically daily as needed for rash.    . ezetimibe (ZETIA) 10 MG tablet Take 1 tablet (10 mg total) by mouth daily. 90 tablet 3  . fluticasone (FLONASE) 50 MCG/ACT nasal spray Place 1 spray into both nostrils daily as needed for allergies or rhinitis.    Marland Kitchen gabapentin (NEURONTIN) 300 MG capsule Take 1 capsule (300 mg total) by mouth 2 (two) times daily. 90 capsule 3  . glimepiride (AMARYL) 4 MG tablet Take 4 mg by mouth 2 (two) times daily.     Marland Kitchen LANTUS SOLOSTAR 100 UNIT/ML Solostar Pen 40 Units daily.    Marland Kitchen levothyroxine (SYNTHROID) 175 MCG tablet Take 175 mcg by mouth daily.     . meloxicam (MOBIC) 15 MG tablet     . metFORMIN (GLUCOPHAGE) 850 MG tablet Take 850 mg by mouth 2 (two) times daily.     . metoprolol tartrate (LOPRESSOR) 25 MG tablet Take 25 mg by mouth 2 (two) times daily.     . nitroGLYCERIN (NITROSTAT) 0.4 MG SL tablet Place 1 tablet (0.4 mg total) under the tongue every 5 (five) minutes as needed for chest pain. 25 tablet 3  . omeprazole (PRILOSEC) 40 MG capsule Take 40 mg by mouth daily.    Marland Kitchen  rosuvastatin (CRESTOR) 10 MG tablet Take 1 tablet (10 mg total) by mouth daily. 90 tablet 3  . Tetrahydrozoline HCl (VISINE OP) Place 1 drop into both eyes at bedtime as needed (irritation).     No current facility-administered medications on file prior to visit.   No Known Allergies  Recent Results (from the past 2160 hour(s))  WOUND CULTURE     Status: Abnormal   Collection Time: 04/20/20  1:05 PM   Specimen: Foot, Right; Wound   Wound Culture and sens  Result Value Ref Range   Gram Stain Result Final report    Organism ID, Bacteria Comment     Comment: No white blood cells seen.   Organism ID, Bacteria Comment     Comment: Many gram positive cocci.   Aerobic Bacterial Culture Final  report (A)    Organism ID, Bacteria Staphylococcus aureus (A)     Comment: Heavy growth Most isolates of Staphylococcus sp. produce a beta-lactamase enzyme rendering them resistant to penicillin. Please contact the laboratory if penicillin is being considered for therapy.    Antimicrobial Susceptibility Comment     Comment:       ** S = Susceptible; I = Intermediate; R = Resistant **                    P = Positive; N = Negative             MICS are expressed in micrograms per mL    Antibiotic                 RSLT#1    RSLT#2    RSLT#3    RSLT#4 Ciprofloxacin                  S Clindamycin                    S Erythromycin                   S Gentamicin                     S Levofloxacin                   S Linezolid                      S Moxifloxacin                   S Oxacillin                      S Quinupristin/Dalfopristin      S Rifampin                       S Tetracycline                   S Trimethoprim/Sulfa             S Vancomycin                     S   CBC with Differential     Status: Abnormal   Collection Time: 04/22/20 11:22 AM  Result Value Ref Range   WBC 9.2 3.4 - 10.8 x10E3/uL   RBC 4.85 4.14 - 5.80 x10E6/uL   Hemoglobin 14.3 13.0 - 17.7 g/dL   Hematocrit 44.2 45.8 - 51.0 %  MCV 89 79 - 97 fL   MCH 29.5 26.6 - 33.0 pg   MCHC 33.1 31.5 - 35.7 g/dL   RDW 12.6 11.6 - 15.4 %   Platelets 286 150 - 450 x10E3/uL   Neutrophils 69 Not Estab. %   Lymphs 12 Not Estab. %   Monocytes 10 Not Estab. %   Eos 8 Not Estab. %   Basos 1 Not Estab. %   Neutrophils Absolute 6.4 1.4 - 7.0 x10E3/uL   Lymphocytes Absolute 1.1 0.7 - 3.1 x10E3/uL   Monocytes Absolute 0.9 0.1 - 0.9 x10E3/uL   EOS (ABSOLUTE) 0.7 (H) 0.0 - 0.4 x10E3/uL   Basophils Absolute 0.1 0.0 - 0.2 x10E3/uL   Immature Granulocytes 0 Not Estab. %   Immature Grans (Abs) 0.0 0.0 - 0.1 W26V7/CH  Basic Metabolic Panel     Status: Abnormal   Collection Time: 04/22/20 11:22 AM  Result Value Ref Range    Glucose 149 (H) 65 - 99 mg/dL   BUN 15 8 - 27 mg/dL   Creatinine, Ser 0.91 0.76 - 1.27 mg/dL   eGFR 91 >59 mL/min/1.73   BUN/Creatinine Ratio 16 10 - 24   Sodium 141 134 - 144 mmol/L   Potassium 4.6 3.5 - 5.2 mmol/L   Chloride 101 96 - 106 mmol/L   CO2 22 20 - 29 mmol/L   Calcium 9.6 8.6 - 10.2 mg/dL  Sedimentation Rate     Status: None   Collection Time: 04/22/20 11:22 AM  Result Value Ref Range   Sed Rate 19 0 - 30 mm/hr  C-reactive protein     Status: Abnormal   Collection Time: 04/22/20 11:22 AM  Result Value Ref Range   CRP 39 (H) 0 - 10 mg/L    Objective: General: Patient is awake, alert, and oriented x 3 and in no acute distress.  Integument: Skin is warm, dry and supple bilateral. Nails are short, thickened, and dystrophic with subungual debris, consistent with onychomycosis, 1-5 bilateral worse at bilateral hallux.  There is a less than 0.2 cm ulceration to the dorsal fifth toe that probes to deep soft tissue 1 cm concerning for deep space infection with cellulitis status appears to be improving however due to the probing nature of this wound concern for high possibility of early osteomyelitis.  Neurovascular status unchanged from prior  Musculoskeletal: Moderate tenderness to palpation to right fifth toe and right leg. + Hammertoe with varus rotation.   Assessment and Plan: Problem List Items Addressed This Visit   None   Visit Diagnoses    Cellulitis and abscess of toe of right foot    -  Primary   Toe ulcer, right, limited to breakdown of skin ([place])       Diabetic polyneuropathy associated with type 2 diabetes mellitus ([place])          -Examined patient. -Change clindamycin to Nuzyra antibiotic gave patient samples to start tomorrow and send order to [facility] for further authorization for doses for 5 more days to end on Wednesday 4/20 -Applied Betadine and dry dressing and advised patient to do the same at minimum daily -Continue with surgical shoe for  patient to wear as instructed and continue with rest and elevation to assist with pain and edema control -Blood work results reviewed with patient with only elevation in CRP which is consistent with cellulitis -Advised patient to continue to monitor if worsens to come to office sooner patient is scheduled to come back on next week for  follow-up evaluation and at this appointment we will do updated x-rays to recheck bone.  Landis Martins, DPM

## 2020-05-05 ENCOUNTER — Telehealth: Payer: Self-pay

## 2020-05-05 NOTE — Telephone Encounter (Signed)
Patient called on 05/02/20 when office was closed and LVM stating he has not received the rest of the Rx'd abx medications ([place]) that were suppose to get by home mail.

## 2020-05-05 NOTE — Telephone Encounter (Signed)
Ok we will call the rep to have them to follow up on this. -Dr. Cannon Kettle

## 2020-05-06 ENCOUNTER — Encounter: Payer: Self-pay | Admitting: Sports Medicine

## 2020-05-06 ENCOUNTER — Ambulatory Visit (INDEPENDENT_AMBULATORY_CARE_PROVIDER_SITE_OTHER): Payer: Medicare HMO

## 2020-05-06 ENCOUNTER — Other Ambulatory Visit: Payer: Self-pay

## 2020-05-06 ENCOUNTER — Ambulatory Visit: Payer: Medicare HMO | Admitting: Sports Medicine

## 2020-05-06 DIAGNOSIS — L02611 Cutaneous abscess of right foot: Secondary | ICD-10-CM | POA: Diagnosis not present

## 2020-05-06 DIAGNOSIS — M86171 Other acute osteomyelitis, right ankle and foot: Secondary | ICD-10-CM

## 2020-05-06 DIAGNOSIS — L97511 Non-pressure chronic ulcer of other part of right foot limited to breakdown of skin: Secondary | ICD-10-CM

## 2020-05-06 DIAGNOSIS — E1142 Type 2 diabetes mellitus with diabetic polyneuropathy: Secondary | ICD-10-CM

## 2020-05-06 DIAGNOSIS — L03031 Cellulitis of right toe: Secondary | ICD-10-CM | POA: Diagnosis not present

## 2020-05-06 NOTE — Patient Instructions (Addendum)
Apply betadine to toe at bedtime and sleep with it open to air Cover toe during the day with a piece of gauze and then put on sock to keep it in place Continue to wear tennis shoe with cut out to prevent rubbing on toe If redness and swelling worsens call [facility] to get the remainder of your antibiotic prescription of Nuzyra filled and call office.  [facility] [place], [place]  P: [phone]

## 2020-05-06 NOTE — Progress Notes (Signed)
Subjective: Robert Santiago is a 70 y.o. male patient with history of diabetes returns office for follow-up evaluation of right fifth toe infection.  Patient reports that his toe is doing better reports that he took all of the sample of the antibiotic and the toe got better. Denies nausea/vomitting/fever/chills or any other symptoms at this time.   FBS not recorded.   Patient Active Problem List   Diagnosis Date Noted  . Diabetes mellitus without complication ([place])   . Acquired hypothyroidism 03/19/2019  . Atherosclerosis of autologous vein coronary artery bypass graft(s) with unstable angina pectoris ([place]) 03/19/2019  . Encounter for screening for diabetes mellitus 03/19/2019  . Status post cardiac catheterization 03/19/2019  . Type 2 diabetes mellitus without complication ([place]) 21/19/4174  . Coronary artery disease involving native coronary artery of native heart with angina pectoris ([place]) 03/19/2019  . Coronary artery disease of native artery of native heart with stable angina pectoris ([place])   . Hyperlipidemia   . Essential hypertension   . Progressive angina (HCC)-class III 12/19/2018  . COVID-19 08/2018  . Hx of CABG 08/23/2014  . Multiple vessel coronary artery disease 08/21/2014   Current Outpatient Medications on File Prior to Visit  Medication Sig Dispense Refill  . acetaminophen (TYLENOL) 500 MG tablet Take 1,000 mg by mouth every 6 (six) hours as needed for moderate pain or headache.    . ALPRAZolam (XANAX) 0.25 MG tablet Take 0.25 mg by mouth daily as needed for anxiety.     Marland Kitchen aspirin EC 81 MG tablet Take 81 mg by mouth daily.    . BD PEN NEEDLE NANO U/F 32G X 4 MM MISC     . benazepril (LOTENSIN) 20 MG tablet Take 20 mg by mouth daily.    . citalopram (CELEXA) 40 MG tablet Take 40 mg by mouth daily.    . clopidogrel (PLAVIX) 75 MG tablet Take 1 tablet (75 mg total) by mouth daily. 90 tablet 3  . clotrimazole-betamethasone (LOTRISONE) cream Apply 1 application topically  daily as needed for rash.    . ezetimibe (ZETIA) 10 MG tablet Take 1 tablet (10 mg total) by mouth daily. 90 tablet 3  . fluticasone (FLONASE) 50 MCG/ACT nasal spray Place 1 spray into both nostrils daily as needed for allergies or rhinitis.    Marland Kitchen gabapentin (NEURONTIN) 300 MG capsule Take 1 capsule (300 mg total) by mouth 2 (two) times daily. 90 capsule 3  . glimepiride (AMARYL) 4 MG tablet Take 4 mg by mouth 2 (two) times daily.     Marland Kitchen LANTUS SOLOSTAR 100 UNIT/ML Solostar Pen 40 Units daily.    Marland Kitchen levothyroxine (SYNTHROID) 175 MCG tablet Take 175 mcg by mouth daily.     . meloxicam (MOBIC) 15 MG tablet     . metFORMIN (GLUCOPHAGE) 850 MG tablet Take 850 mg by mouth 2 (two) times daily.     . metoprolol tartrate (LOPRESSOR) 25 MG tablet Take 25 mg by mouth 2 (two) times daily.     . nitroGLYCERIN (NITROSTAT) 0.4 MG SL tablet Place 1 tablet (0.4 mg total) under the tongue every 5 (five) minutes as needed for chest pain. 25 tablet 3  . omeprazole (PRILOSEC) 40 MG capsule Take 40 mg by mouth daily.    . rosuvastatin (CRESTOR) 10 MG tablet Take 1 tablet (10 mg total) by mouth daily. 90 tablet 3  . Tetrahydrozoline HCl (VISINE OP) Place 1 drop into both eyes at bedtime as needed (irritation).     No current  facility-administered medications on file prior to visit.   No Known Allergies  Recent Results (from the past 2160 hour(s))  WOUND CULTURE     Status: Abnormal   Collection Time: 04/20/20  1:05 PM   Specimen: Foot, Right; Wound   Wound Culture and sens  Result Value Ref Range   Gram Stain Result Final report    Organism ID, Bacteria Comment     Comment: No white blood cells seen.   Organism ID, Bacteria Comment     Comment: Many gram positive cocci.   Aerobic Bacterial Culture Final report (A)    Organism ID, Bacteria Staphylococcus aureus (A)     Comment: Heavy growth Most isolates of Staphylococcus sp. produce a beta-lactamase enzyme rendering them resistant to penicillin. Please  contact the laboratory if penicillin is being considered for therapy.    Antimicrobial Susceptibility Comment     Comment:       ** S = Susceptible; I = Intermediate; R = Resistant **                    P = Positive; N = Negative             MICS are expressed in micrograms per mL    Antibiotic                 RSLT#1    RSLT#2    RSLT#3    RSLT#4 Ciprofloxacin                  S Clindamycin                    S Erythromycin                   S Gentamicin                     S Levofloxacin                   S Linezolid                      S Moxifloxacin                   S Oxacillin                      S Quinupristin/Dalfopristin      S Rifampin                       S Tetracycline                   S Trimethoprim/Sulfa             S Vancomycin                     S   CBC with Differential     Status: Abnormal   Collection Time: 04/22/20 11:22 AM  Result Value Ref Range   WBC 9.2 3.4 - 10.8 x10E3/uL   RBC 4.85 4.14 - 5.80 x10E6/uL   Hemoglobin 14.3 13.0 - 17.7 g/dL   Hematocrit 43.2 37.5 - 51.0 %   MCV 89 79 - 97 fL   MCH 29.5 26.6 - 33.0 pg   MCHC 33.1 31.5 - 35.7 g/dL   RDW 12.6 11.6 - 15.4 %   Platelets 286 150 - 450 x10E3/uL  Neutrophils 69 Not Estab. %   Lymphs 12 Not Estab. %   Monocytes 10 Not Estab. %   Eos 8 Not Estab. %   Basos 1 Not Estab. %   Neutrophils Absolute 6.4 1.4 - 7.0 x10E3/uL   Lymphocytes Absolute 1.1 0.7 - 3.1 x10E3/uL   Monocytes Absolute 0.9 0.1 - 0.9 x10E3/uL   EOS (ABSOLUTE) 0.7 (H) 0.0 - 0.4 x10E3/uL   Basophils Absolute 0.1 0.0 - 0.2 x10E3/uL   Immature Granulocytes 0 Not Estab. %   Immature Grans (Abs) 0.0 0.0 - 0.1 V36P2/AE  Basic Metabolic Panel     Status: Abnormal   Collection Time: 04/22/20 11:22 AM  Result Value Ref Range   Glucose 149 (H) 65 - 99 mg/dL   BUN 15 8 - 27 mg/dL   Creatinine, Ser 0.91 0.76 - 1.27 mg/dL   eGFR 91 >59 mL/min/1.73   BUN/Creatinine Ratio 16 10 - 24   Sodium 141 134 - 144 mmol/L   Potassium 4.6 3.5  - 5.2 mmol/L   Chloride 101 96 - 106 mmol/L   CO2 22 20 - 29 mmol/L   Calcium 9.6 8.6 - 10.2 mg/dL  Sedimentation Rate     Status: None   Collection Time: 04/22/20 11:22 AM  Result Value Ref Range   Sed Rate 19 0 - 30 mm/hr  C-reactive protein     Status: Abnormal   Collection Time: 04/22/20 11:22 AM  Result Value Ref Range   CRP 39 (H) 0 - 10 mg/L    Objective: General: Patient is awake, alert, and oriented x 3 and in no acute distress.  Integument: Skin is warm, dry and supple bilateral. Nails are short, thickened, and dystrophic with subungual debris, consistent with onychomycosis, 1-5 bilateral worse at bilateral hallux.  Right 5th toe ulcer is healed with a small abrasion to the area. Decreased swelling, redness, and warmth to the area.   Neurovascular status unchanged from prior  Musculoskeletal: No reproducible tenderness to palpation to right fifth toe and right leg. + Hammertoe with varus rotation.   Assessment and Plan: Problem List Items Addressed This Visit   None   Visit Diagnoses    Cellulitis and abscess of toe of right foot    -  Primary   Relevant Orders   DG Foot Complete Right   Toe ulcer, right, limited to breakdown of skin ([place])       Diabetic polyneuropathy associated with type 2 diabetes mellitus ([place])       Acute osteomyelitis of toe, right ([place])          -Examined patient. -Nuzyra trial antibiotic completed; advised patient to call [facility] if the redness worsens -Applied Betadine and guaze to right 5th toe and advised patient to do the same -Xrays show decreased mineralization at middle phalanx ?early OM but since clinically improving will monitor -Advised patient to return to office or go to ER if symptoms worsen  Landis Martins, DPM

## 2020-05-09 ENCOUNTER — Other Ambulatory Visit: Payer: Self-pay | Admitting: Sports Medicine

## 2020-05-09 DIAGNOSIS — L97511 Non-pressure chronic ulcer of other part of right foot limited to breakdown of skin: Secondary | ICD-10-CM

## 2020-05-09 DIAGNOSIS — L03031 Cellulitis of right toe: Secondary | ICD-10-CM

## 2020-05-09 DIAGNOSIS — L02611 Cutaneous abscess of right foot: Secondary | ICD-10-CM

## 2020-05-18 ENCOUNTER — Ambulatory Visit: Payer: Medicare HMO | Admitting: Sports Medicine

## 2020-05-20 ENCOUNTER — Ambulatory Visit: Payer: Medicare HMO | Admitting: Sports Medicine

## 2020-05-20 ENCOUNTER — Encounter: Payer: Self-pay | Admitting: Sports Medicine

## 2020-05-20 ENCOUNTER — Other Ambulatory Visit: Payer: Self-pay

## 2020-05-20 DIAGNOSIS — L02611 Cutaneous abscess of right foot: Secondary | ICD-10-CM | POA: Diagnosis not present

## 2020-05-20 DIAGNOSIS — M7751 Other enthesopathy of right foot: Secondary | ICD-10-CM

## 2020-05-20 DIAGNOSIS — M25571 Pain in right ankle and joints of right foot: Secondary | ICD-10-CM

## 2020-05-20 DIAGNOSIS — L03031 Cellulitis of right toe: Secondary | ICD-10-CM

## 2020-05-20 DIAGNOSIS — M86171 Other acute osteomyelitis, right ankle and foot: Secondary | ICD-10-CM

## 2020-05-20 DIAGNOSIS — E1142 Type 2 diabetes mellitus with diabetic polyneuropathy: Secondary | ICD-10-CM

## 2020-05-20 DIAGNOSIS — L97511 Non-pressure chronic ulcer of other part of right foot limited to breakdown of skin: Secondary | ICD-10-CM

## 2020-05-20 MED ORDER — TRIAMCINOLONE ACETONIDE 10 MG/ML IJ SUSP: 10.0000 mg | Freq: Once | INTRAMUSCULAR | Status: DC

## 2020-05-20 NOTE — Progress Notes (Signed)
Subjective: Robert Santiago is a 70 y.o. male patient with history of diabetes returns office for follow-up evaluation of right fifth toe infection.  Patient reports that his toe is doing good but his major problem now is some pain in the ankle has been doing a lot of walking and standing helping to prepare to move which has been a lot at work.  FBS 85 this morning.   Patient Active Problem List   Diagnosis Date Noted  . Diabetes mellitus without complication ([place])   . Acquired hypothyroidism 03/19/2019  . Atherosclerosis of autologous vein coronary artery bypass graft(s) with unstable angina pectoris ([place]) 03/19/2019  . Encounter for screening for diabetes mellitus 03/19/2019  . Status post cardiac catheterization 03/19/2019  . Type 2 diabetes mellitus without complication ([place]) 20/25/4270  . Coronary artery disease involving native coronary artery of native heart with angina pectoris ([place]) 03/19/2019  . Coronary artery disease of native artery of native heart with stable angina pectoris ([place])   . Hyperlipidemia   . Essential hypertension   . Progressive angina (HCC)-class III 12/19/2018  . COVID-19 08/2018  . Hx of CABG 08/23/2014  . Multiple vessel coronary artery disease 08/21/2014   Current Outpatient Medications on File Prior to Visit  Medication Sig Dispense Refill  . acetaminophen (TYLENOL) 500 MG tablet Take 1,000 mg by mouth every 6 (six) hours as needed for moderate pain or headache.    . ALPRAZolam (XANAX) 0.25 MG tablet Take 0.25 mg by mouth daily as needed for anxiety.     Marland Kitchen aspirin EC 81 MG tablet Take 81 mg by mouth daily.    . BD PEN NEEDLE NANO U/F 32G X 4 MM MISC     . benazepril (LOTENSIN) 20 MG tablet Take 20 mg by mouth daily.    . citalopram (CELEXA) 40 MG tablet Take 40 mg by mouth daily.    . clopidogrel (PLAVIX) 75 MG tablet Take 1 tablet (75 mg total) by mouth daily. 90 tablet 3  . clotrimazole-betamethasone (LOTRISONE) cream Apply 1 application topically  daily as needed for rash.    . ezetimibe (ZETIA) 10 MG tablet Take 1 tablet (10 mg total) by mouth daily. 90 tablet 3  . fluticasone (FLONASE) 50 MCG/ACT nasal spray Place 1 spray into both nostrils daily as needed for allergies or rhinitis.    Marland Kitchen gabapentin (NEURONTIN) 300 MG capsule Take 1 capsule (300 mg total) by mouth 2 (two) times daily. 90 capsule 3  . glimepiride (AMARYL) 4 MG tablet Take 4 mg by mouth 2 (two) times daily.     Marland Kitchen LANTUS SOLOSTAR 100 UNIT/ML Solostar Pen 40 Units daily.    Marland Kitchen levothyroxine (SYNTHROID) 175 MCG tablet Take 175 mcg by mouth daily.     . meloxicam (MOBIC) 15 MG tablet     . metFORMIN (GLUCOPHAGE) 850 MG tablet Take 850 mg by mouth 2 (two) times daily.     . metoprolol tartrate (LOPRESSOR) 25 MG tablet Take 25 mg by mouth 2 (two) times daily.     . nitroGLYCERIN (NITROSTAT) 0.4 MG SL tablet Place 1 tablet (0.4 mg total) under the tongue every 5 (five) minutes as needed for chest pain. 25 tablet 3  . omeprazole (PRILOSEC) 40 MG capsule Take 40 mg by mouth daily.    . rosuvastatin (CRESTOR) 10 MG tablet Take 1 tablet (10 mg total) by mouth daily. 90 tablet 3  . Tetrahydrozoline HCl (VISINE OP) Place 1 drop into both eyes at bedtime as needed (  irritation).     No current facility-administered medications on file prior to visit.   No Known Allergies  Recent Results (from the past 2160 hour(s))  WOUND CULTURE     Status: Abnormal   Collection Time: 04/20/20  1:05 PM   Specimen: Foot, Right; Wound   Wound Culture and sens  Result Value Ref Range   Gram Stain Result Final report    Organism ID, Bacteria Comment     Comment: No white blood cells seen.   Organism ID, Bacteria Comment     Comment: Many gram positive cocci.   Aerobic Bacterial Culture Final report (A)    Organism ID, Bacteria Staphylococcus aureus (A)     Comment: Heavy growth Most isolates of Staphylococcus sp. produce a beta-lactamase enzyme rendering them resistant to penicillin. Please  contact the laboratory if penicillin is being considered for therapy.    Antimicrobial Susceptibility Comment     Comment:       ** S = Susceptible; I = Intermediate; R = Resistant **                    P = Positive; N = Negative             MICS are expressed in micrograms per mL    Antibiotic                 RSLT#1    RSLT#2    RSLT#3    RSLT#4 Ciprofloxacin                  S Clindamycin                    S Erythromycin                   S Gentamicin                     S Levofloxacin                   S Linezolid                      S Moxifloxacin                   S Oxacillin                      S Quinupristin/Dalfopristin      S Rifampin                       S Tetracycline                   S Trimethoprim/Sulfa             S Vancomycin                     S   CBC with Differential     Status: Abnormal   Collection Time: 04/22/20 11:22 AM  Result Value Ref Range   WBC 9.2 3.4 - 10.8 x10E3/uL   RBC 4.85 4.14 - 5.80 x10E6/uL   Hemoglobin 14.3 13.0 - 17.7 g/dL   Hematocrit 43.2 37.5 - 51.0 %   MCV 89 79 - 97 fL   MCH 29.5 26.6 - 33.0 pg   MCHC 33.1 31.5 - 35.7 g/dL   RDW 12.6 11.6 - 15.4 %   Platelets  286 150 - 450 x10E3/uL   Neutrophils 69 Not Estab. %   Lymphs 12 Not Estab. %   Monocytes 10 Not Estab. %   Eos 8 Not Estab. %   Basos 1 Not Estab. %   Neutrophils Absolute 6.4 1.4 - 7.0 x10E3/uL   Lymphocytes Absolute 1.1 0.7 - 3.1 x10E3/uL   Monocytes Absolute 0.9 0.1 - 0.9 x10E3/uL   EOS (ABSOLUTE) 0.7 (H) 0.0 - 0.4 x10E3/uL   Basophils Absolute 0.1 0.0 - 0.2 x10E3/uL   Immature Granulocytes 0 Not Estab. %   Immature Grans (Abs) 0.0 0.0 - 0.1 [id number]  Basic Metabolic Panel     Status: Abnormal   Collection Time: 04/22/20 11:22 AM  Result Value Ref Range   Glucose 149 (H) 65 - 99 mg/dL   BUN 15 8 - 27 mg/dL   Creatinine, Ser 0.91 0.76 - 1.27 mg/dL   eGFR 91 >59 mL/min/1.73   BUN/Creatinine Ratio 16 10 - 24   Sodium 141 134 - 144 mmol/L   Potassium 4.6 3.5  - 5.2 mmol/L   Chloride 101 96 - 106 mmol/L   CO2 22 20 - 29 mmol/L   Calcium 9.6 8.6 - 10.2 mg/dL  Sedimentation Rate     Status: None   Collection Time: 04/22/20 11:22 AM  Result Value Ref Range   Sed Rate 19 0 - 30 mm/hr  C-reactive protein     Status: Abnormal   Collection Time: 04/22/20 11:22 AM  Result Value Ref Range   CRP 39 (H) 0 - 10 mg/L    Objective: General: Patient is awake, alert, and oriented x 3 and in no acute distress.  Integument: Skin is warm, dry and supple bilateral. Nails are short, thickened, and dystrophic with subungual debris, consistent with onychomycosis, 1-5 bilateral worse at bilateral hallux.  Right 5th toe ulcer is healed with a small scab to the area.  Resolved swelling, redness, and warmth to the area.   Neurovascular status unchanged from prior  Musculoskeletal: No reproducible tenderness to palpation to right fifth toe however there is new pain at the dorsal lateral right ankle.  There is no frank instability noted at the right ankle. + Hammertoe with varus rotation.   Assessment and Plan: Problem List Items Addressed This Visit   None   Visit Diagnoses    Cellulitis and abscess of toe of right foot    -  Primary   Toe ulcer, right, limited to breakdown of skin ([place])       Diabetic polyneuropathy associated with type 2 diabetes mellitus ([place])       Acute osteomyelitis of toe, right (HCC)       Capsulitis of right ankle       Acute right ankle pain          -Examined patient. -Applied protective gauze dressing and advised patient to do the same to prevent the toe from rubbing when in shoe -Advised patient if area rubs again needs to have surgery to remove bone/arthroplasty at the right fifth toe -Encourage patient to consider shoe that are wider that do not rub baby toe -After oral consent and aseptic prep, injected a mixture containing 1 ml of 2%  plain lidocaine, 1 ml 0.5% plain marcaine, 0.5 ml of kenalog 10 and 0.5 ml of dexamethasone  phosphate into right lateral ankle without complication. Post-injection care discussed with patient.  -Dispensed Surgigrip compression sleeve to assist with pain and edema control at right ankle -Advised patient if ankle  pain still continues will require x-ray of the right ankle at next visit if continues to be painful -Return to office as scheduled or sooner problems or issues arise.  Landis Martins, DPM

## 2020-06-17 ENCOUNTER — Ambulatory Visit: Payer: Medicare HMO | Admitting: Sports Medicine

## 2020-06-23 DIAGNOSIS — I2510 Atherosclerotic heart disease of native coronary artery without angina pectoris: Secondary | ICD-10-CM | POA: Diagnosis not present

## 2020-06-23 DIAGNOSIS — E1169 Type 2 diabetes mellitus with other specified complication: Secondary | ICD-10-CM | POA: Diagnosis not present

## 2020-06-23 DIAGNOSIS — E785 Hyperlipidemia, unspecified: Secondary | ICD-10-CM | POA: Diagnosis not present

## 2020-06-23 DIAGNOSIS — I10 Essential (primary) hypertension: Secondary | ICD-10-CM | POA: Diagnosis not present

## 2020-06-23 DIAGNOSIS — E291 Testicular hypofunction: Secondary | ICD-10-CM | POA: Diagnosis not present

## 2020-06-23 DIAGNOSIS — E039 Hypothyroidism, unspecified: Secondary | ICD-10-CM | POA: Diagnosis not present

## 2020-06-23 DIAGNOSIS — F3341 Major depressive disorder, recurrent, in partial remission: Secondary | ICD-10-CM | POA: Diagnosis not present

## 2020-06-23 DIAGNOSIS — J309 Allergic rhinitis, unspecified: Secondary | ICD-10-CM | POA: Diagnosis not present

## 2020-07-21 DIAGNOSIS — E785 Hyperlipidemia, unspecified: Secondary | ICD-10-CM | POA: Diagnosis not present

## 2020-07-21 DIAGNOSIS — E1169 Type 2 diabetes mellitus with other specified complication: Secondary | ICD-10-CM | POA: Diagnosis not present

## 2020-07-21 DIAGNOSIS — J309 Allergic rhinitis, unspecified: Secondary | ICD-10-CM | POA: Diagnosis not present

## 2020-07-21 DIAGNOSIS — K219 Gastro-esophageal reflux disease without esophagitis: Secondary | ICD-10-CM | POA: Diagnosis not present

## 2020-07-21 DIAGNOSIS — E291 Testicular hypofunction: Secondary | ICD-10-CM | POA: Diagnosis not present

## 2020-07-21 DIAGNOSIS — I4891 Unspecified atrial fibrillation: Secondary | ICD-10-CM | POA: Diagnosis not present

## 2020-07-21 DIAGNOSIS — N184 Chronic kidney disease, stage 4 (severe): Secondary | ICD-10-CM | POA: Diagnosis not present

## 2020-07-21 DIAGNOSIS — I2510 Atherosclerotic heart disease of native coronary artery without angina pectoris: Secondary | ICD-10-CM | POA: Diagnosis not present

## 2020-07-21 DIAGNOSIS — Z794 Long term (current) use of insulin: Secondary | ICD-10-CM | POA: Diagnosis not present

## 2020-07-21 DIAGNOSIS — Z6841 Body Mass Index (BMI) 40.0 and over, adult: Secondary | ICD-10-CM | POA: Diagnosis not present

## 2020-07-21 DIAGNOSIS — F3341 Major depressive disorder, recurrent, in partial remission: Secondary | ICD-10-CM | POA: Diagnosis not present

## 2020-07-21 DIAGNOSIS — I10 Essential (primary) hypertension: Secondary | ICD-10-CM | POA: Diagnosis not present

## 2020-08-03 ENCOUNTER — Ambulatory Visit: Payer: Medicare HMO | Admitting: Sports Medicine

## 2020-08-16 DIAGNOSIS — Z139 Encounter for screening, unspecified: Secondary | ICD-10-CM | POA: Diagnosis not present

## 2020-08-16 DIAGNOSIS — I2510 Atherosclerotic heart disease of native coronary artery without angina pectoris: Secondary | ICD-10-CM | POA: Diagnosis not present

## 2020-08-16 DIAGNOSIS — Z9181 History of falling: Secondary | ICD-10-CM | POA: Diagnosis not present

## 2020-08-16 DIAGNOSIS — K219 Gastro-esophageal reflux disease without esophagitis: Secondary | ICD-10-CM | POA: Diagnosis not present

## 2020-08-16 DIAGNOSIS — E1169 Type 2 diabetes mellitus with other specified complication: Secondary | ICD-10-CM | POA: Diagnosis not present

## 2020-08-16 DIAGNOSIS — E291 Testicular hypofunction: Secondary | ICD-10-CM | POA: Diagnosis not present

## 2020-08-16 DIAGNOSIS — I10 Essential (primary) hypertension: Secondary | ICD-10-CM | POA: Diagnosis not present

## 2020-08-16 DIAGNOSIS — J309 Allergic rhinitis, unspecified: Secondary | ICD-10-CM | POA: Diagnosis not present

## 2020-08-16 DIAGNOSIS — E785 Hyperlipidemia, unspecified: Secondary | ICD-10-CM | POA: Diagnosis not present

## 2020-08-17 ENCOUNTER — Other Ambulatory Visit: Payer: Self-pay

## 2020-08-17 ENCOUNTER — Ambulatory Visit: Payer: Medicare HMO | Admitting: Sports Medicine

## 2020-08-17 ENCOUNTER — Ambulatory Visit (INDEPENDENT_AMBULATORY_CARE_PROVIDER_SITE_OTHER): Payer: Medicare HMO

## 2020-08-17 ENCOUNTER — Encounter: Payer: Self-pay | Admitting: Sports Medicine

## 2020-08-17 DIAGNOSIS — E1142 Type 2 diabetes mellitus with diabetic polyneuropathy: Secondary | ICD-10-CM

## 2020-08-17 DIAGNOSIS — L97511 Non-pressure chronic ulcer of other part of right foot limited to breakdown of skin: Secondary | ICD-10-CM | POA: Diagnosis not present

## 2020-08-17 DIAGNOSIS — M25571 Pain in right ankle and joints of right foot: Secondary | ICD-10-CM | POA: Diagnosis not present

## 2020-08-17 DIAGNOSIS — M7751 Other enthesopathy of right foot: Secondary | ICD-10-CM

## 2020-08-17 DIAGNOSIS — M2041 Other hammer toe(s) (acquired), right foot: Secondary | ICD-10-CM

## 2020-08-17 DIAGNOSIS — M2042 Other hammer toe(s) (acquired), left foot: Secondary | ICD-10-CM

## 2020-08-17 MED ORDER — SULFAMETHOXAZOLE-TRIMETHOPRIM 800-160 MG PO TABS: 1.0000 | ORAL_TABLET | Freq: Two times a day (BID) | ORAL | 0 refills | Status: DC

## 2020-08-17 MED ORDER — TRIAMCINOLONE ACETONIDE 10 MG/ML IJ SUSP: 10.0000 mg | Freq: Once | INTRAMUSCULAR | Status: DC

## 2020-08-17 NOTE — Progress Notes (Signed)
Subjective: Robert Santiago is a 70 y.o. diabetic male patient seen in office for evaluation of ulceration of the right great toe.  Patient reports that he was picking at the toe and pulled the skin about 3 weeks ago and it went too far and now it is red with drainage and seems swollen he has tried triple antibiotic with Band-Aid with no improvement.  Patient reports that his right fifth toe is healing but still sore from time to time when he uses a cushion and he is thinking about having surgery on it in the fall.  Patient also reports that his right ankle hurts all the time and would like to discuss another shot to this area and states that the compression sock helps with the swelling but he still has pain in the ankle.    Fasting blood sugar this morning 128 A1c 7.8 Patient Active Problem List   Diagnosis Date Noted   Diabetes mellitus without complication ([place])    Acquired hypothyroidism 03/19/2019   Atherosclerosis of autologous vein coronary artery bypass graft(s) with unstable angina pectoris ([place]) 03/19/2019   Encounter for screening for diabetes mellitus 03/19/2019   Status post cardiac catheterization 03/19/2019   Type 2 diabetes mellitus without complication ([place]) 20/94/7096   Coronary artery disease involving native coronary artery of native heart with angina pectoris ([place]) 03/19/2019   Coronary artery disease of native artery of native heart with stable angina pectoris ([place])    Hyperlipidemia    Essential hypertension    Progressive angina (HCC)-class III 12/19/2018   COVID-19 08/2018   Hx of CABG 08/23/2014   Multiple vessel coronary artery disease 08/21/2014   Current Outpatient Medications on File Prior to Visit  Medication Sig Dispense Refill   acetaminophen (TYLENOL) 500 MG tablet Take 1,000 mg by mouth every 6 (six) hours as needed for moderate pain or headache.     ALPRAZolam (XANAX) 0.25 MG tablet Take 0.25 mg by mouth daily as needed for anxiety.      aspirin EC 81 MG  tablet Take 81 mg by mouth daily.     BD PEN NEEDLE NANO U/F 32G X 4 MM MISC      benazepril (LOTENSIN) 20 MG tablet Take 20 mg by mouth daily.     citalopram (CELEXA) 40 MG tablet Take 40 mg by mouth daily.     clopidogrel (PLAVIX) 75 MG tablet Take 1 tablet (75 mg total) by mouth daily. 90 tablet 3   clotrimazole-betamethasone (LOTRISONE) cream Apply 1 application topically daily as needed for rash.     ezetimibe (ZETIA) 10 MG tablet Take 1 tablet (10 mg total) by mouth daily. 90 tablet 3   fluticasone (FLONASE) 50 MCG/ACT nasal spray Place 1 spray into both nostrils daily as needed for allergies or rhinitis.     gabapentin (NEURONTIN) 300 MG capsule Take 1 capsule (300 mg total) by mouth 2 (two) times daily. 90 capsule 3   glimepiride (AMARYL) 4 MG tablet Take 4 mg by mouth 2 (two) times daily.      JANUVIA 100 MG tablet      LANTUS SOLOSTAR 100 UNIT/ML Solostar Pen 40 Units daily.     levothyroxine (SYNTHROID) 175 MCG tablet Take 175 mcg by mouth daily.      meloxicam (MOBIC) 15 MG tablet      metFORMIN (GLUCOPHAGE) 850 MG tablet Take 850 mg by mouth 2 (two) times daily.      metoprolol tartrate (LOPRESSOR) 25 MG tablet Take 25 mg  by mouth 2 (two) times daily.      nitroGLYCERIN (NITROSTAT) 0.4 MG SL tablet Place 1 tablet (0.4 mg total) under the tongue every 5 (five) minutes as needed for chest pain. 25 tablet 3   omeprazole (PRILOSEC) 40 MG capsule Take 40 mg by mouth daily.     rosuvastatin (CRESTOR) 10 MG tablet Take 1 tablet (10 mg total) by mouth daily. 90 tablet 3   Tetrahydrozoline HCl (VISINE OP) Place 1 drop into both eyes at bedtime as needed (irritation).     Current Facility-Administered Medications on File Prior to Visit  Medication Dose Route Frequency Provider Last Rate Last Admin   triamcinolone acetonide (KENALOG) 10 MG/ML injection 10 mg  10 mg Other Once Landis Martins, DPM       No Known Allergies  No results found for this or any previous visit (from the past  2160 hour(s)).  Objective: There were no vitals filed for this visit.  General: Patient is awake, alert, oriented x 3 and in no acute distress.  Dermatology: Skin is warm and dry bilateral with a partial thickness ulceration present right great toe ulceration measures 1.4 cm x 1 cm x 0.2 cm. There is a  Keratotic border with a fiber granular base. The ulceration does not  probe to bone. There is no malodor, no active drainage, localized erythema, localized edema.   No other open lesions noted at this time.  Previous right fifth toe ulcer is well-healed.  Vascular: Dorsalis Pedis pulse = 1/4 Bilateral,  Posterior Tibial pulse = 1/4 Bilateral,  Capillary Fill Time < 5 seconds 1+ pitting edema to right ankle.  Neurologic: Protective sensation diminished bilateral.  Musculosketal: There is no pain to palpation to ulcerated area at right great toe.  There is pain to palpation to the right lateral ankle and limitation of range of motion at the right ankle.  Xrays, right foot: Joint space narrowing at the ankle consistent with arthritis.  Should no bony destruction suggestive of osteomyelitis at the right great toe. No gas in soft tissues.   No results for input(s): GRAMSTAIN, LABORGA in the last 8760 hours.  Assessment and Plan:  Problem List Items Addressed This Visit   None Visit Diagnoses     Toe ulcer, right, limited to breakdown of skin ([place])    -  Primary   Relevant Orders   DG Foot 2 Views Right   Capsulitis of right ankle       Relevant Orders   DG Ankle Complete Right   Diabetic polyneuropathy associated with type 2 diabetes mellitus ([place])       Relevant Medications   JANUVIA 100 MG tablet   Acute right ankle pain       Hammer toes of both feet          -Examined patient and discussed the progression of the wound and treatment alternatives. -Xrays reviewed - Excisionally dedbrided ulceration at right great toe to healthy bleeding borders removing nonviable tissue using  a sterile chisel blade. Wound measures post debridement as above, wound was debrided to the level of the dermis with viable wound base exposed to promote healing. Hemostasis was achieved with manuel pressure. Patient tolerated procedure well without any discomfort or anesthesia necessary for this wound debridement.  -Applied Medihoney and dry sterile dressing and instructed patient to continue with daily dressings at home consisting of same day - Advised patient to go to the ER or return to office if the wound worsens or  if constitutional symptoms are present.   -Prescribed Bactrim for patient to take as directed -After oral consent and aseptic prep, injected a mixture containing 1 ml of 2%  plain lidocaine, 1 ml 0.5% plain marcaine, 0.5 ml of kenalog 10 and 0.5 ml of dexamethasone phosphate into right lateral ankle without complication. Post-injection care discussed with patient.  -Advised patient that if ankle continues may need MRI or further work-up for his arthritis -Encouraged patient to continue daily use of compression garment to make sure does not rub toe or Surgigrip sleeve as dispensed this visit. -Patient to return to office in 2 to for follow up care and evaluation or sooner if problems arise.  Landis Martins, DPM

## 2020-08-22 ENCOUNTER — Telehealth: Payer: Self-pay

## 2020-08-22 NOTE — Telephone Encounter (Signed)
Pt Called stating hi is having an incerase of ankle pain and the shot did not help. Pt states if he could have recommendations, a Rx or a brace Rx'd to him to suppor this ankle  Pharmacy: [facility] in [address]

## 2020-08-23 NOTE — Telephone Encounter (Signed)
Lvm to pt stating Dr. Cannon Kettle would like for him to have a trilock brace which he can come by the office to try on and in addition OTC tylenol arthritis.

## 2020-08-31 ENCOUNTER — Other Ambulatory Visit: Payer: Self-pay

## 2020-08-31 ENCOUNTER — Encounter: Payer: Self-pay | Admitting: Sports Medicine

## 2020-08-31 ENCOUNTER — Ambulatory Visit: Payer: Medicare HMO | Admitting: Sports Medicine

## 2020-08-31 DIAGNOSIS — M86171 Other acute osteomyelitis, right ankle and foot: Secondary | ICD-10-CM | POA: Diagnosis not present

## 2020-08-31 DIAGNOSIS — M25571 Pain in right ankle and joints of right foot: Secondary | ICD-10-CM | POA: Diagnosis not present

## 2020-08-31 DIAGNOSIS — M7751 Other enthesopathy of right foot: Secondary | ICD-10-CM | POA: Diagnosis not present

## 2020-08-31 DIAGNOSIS — L97511 Non-pressure chronic ulcer of other part of right foot limited to breakdown of skin: Secondary | ICD-10-CM

## 2020-08-31 DIAGNOSIS — E1142 Type 2 diabetes mellitus with diabetic polyneuropathy: Secondary | ICD-10-CM

## 2020-08-31 NOTE — Progress Notes (Signed)
Subjective: Loki Wuthrich is a 70 y.o. diabetic male patient seen in office for follow-up evaluation of ulceration of the right great toe.  Patient reports that the wound seems about the same has been using Betadine and Medihoney to the area states that swelling is the same but redness is less and a little clear drainage no warmth or odor.  Patient also reports that the right fifth toe looks good and sometimes occasionally hurts but the most pain is at the right ankle he reports that he has been using the compression sleeve but still has some swelling.  Fasting blood sugar this morning 130 A1c 7.8 Patient Active Problem List   Diagnosis Date Noted   Diabetes mellitus without complication ([place])    Acquired hypothyroidism 03/19/2019   Atherosclerosis of autologous vein coronary artery bypass graft(s) with unstable angina pectoris ([place]) 03/19/2019   Encounter for screening for diabetes mellitus 03/19/2019   Status post cardiac catheterization 03/19/2019   Type 2 diabetes mellitus without complication ([place]) 76/54/6503   Coronary artery disease involving native coronary artery of native heart with angina pectoris ([place]) 03/19/2019   Coronary artery disease of native artery of native heart with stable angina pectoris ([place])    Hyperlipidemia    Essential hypertension    Progressive angina (HCC)-class III 12/19/2018   COVID-19 08/2018   Hx of CABG 08/23/2014   Multiple vessel coronary artery disease 08/21/2014   Current Outpatient Medications on File Prior to Visit  Medication Sig Dispense Refill   acetaminophen (TYLENOL) 500 MG tablet Take 1,000 mg by mouth every 6 (six) hours as needed for moderate pain or headache.     ALPRAZolam (XANAX) 0.25 MG tablet Take 0.25 mg by mouth daily as needed for anxiety.      aspirin EC 81 MG tablet Take 81 mg by mouth daily.     BD PEN NEEDLE NANO U/F 32G X 4 MM MISC      benazepril (LOTENSIN) 20 MG tablet Take 20 mg by mouth daily.     citalopram (CELEXA) 40  MG tablet Take 40 mg by mouth daily.     clopidogrel (PLAVIX) 75 MG tablet Take 1 tablet (75 mg total) by mouth daily. 90 tablet 3   clotrimazole-betamethasone (LOTRISONE) cream Apply 1 application topically daily as needed for rash.     ezetimibe (ZETIA) 10 MG tablet Take 1 tablet (10 mg total) by mouth daily. 90 tablet 3   fluticasone (FLONASE) 50 MCG/ACT nasal spray Place 1 spray into both nostrils daily as needed for allergies or rhinitis.     gabapentin (NEURONTIN) 300 MG capsule Take 1 capsule (300 mg total) by mouth 2 (two) times daily. 90 capsule 3   glimepiride (AMARYL) 4 MG tablet Take 4 mg by mouth 2 (two) times daily.      JANUVIA 100 MG tablet      LANTUS SOLOSTAR 100 UNIT/ML Solostar Pen 40 Units daily.     levothyroxine (SYNTHROID) 175 MCG tablet Take 175 mcg by mouth daily.      meloxicam (MOBIC) 15 MG tablet      metFORMIN (GLUCOPHAGE) 850 MG tablet Take 850 mg by mouth 2 (two) times daily.      metoprolol tartrate (LOPRESSOR) 25 MG tablet Take 25 mg by mouth 2 (two) times daily.      nitroGLYCERIN (NITROSTAT) 0.4 MG SL tablet Place 1 tablet (0.4 mg total) under the tongue every 5 (five) minutes as needed for chest pain. 25 tablet 3   omeprazole (  PRILOSEC) 40 MG capsule Take 40 mg by mouth daily.     rosuvastatin (CRESTOR) 10 MG tablet Take 1 tablet (10 mg total) by mouth daily. 90 tablet 3   sulfamethoxazole-trimethoprim (BACTRIM DS) 800-160 MG tablet Take 1 tablet by mouth 2 (two) times daily. 28 tablet 0   Tetrahydrozoline HCl (VISINE OP) Place 1 drop into both eyes at bedtime as needed (irritation).     Current Facility-Administered Medications on File Prior to Visit  Medication Dose Route Frequency Provider Last Rate Last Admin   triamcinolone acetonide (KENALOG) 10 MG/ML injection 10 mg  10 mg Other Once Landis Martins, DPM       triamcinolone acetonide (KENALOG) 10 MG/ML injection 10 mg  10 mg Other Once Landis Martins, DPM       No Known Allergies  No results  found for this or any previous visit (from the past 2160 hour(s)).  Objective: There were no vitals filed for this visit.  General: Patient is awake, alert, oriented x 3 and in no acute distress.  Dermatology: Skin is warm and dry bilateral with a partial thickness ulceration present right great toe ulceration measures 1.4 cm x 1.5 cm x 0.2 cm. There is a Keratotic border with a fibrogranular base. The ulceration does not probe to bone. There is no malodor, no active drainage, localized erythema, localized edema.   No other open lesions noted at this time.  Previous right fifth toe ulcer is well-healed.  Vascular: Dorsalis Pedis pulse = 1/4 Bilateral,  Posterior Tibial pulse = 1/4 Bilateral,  Capillary Fill Time < 5 seconds 1+ pitting edema to right ankle.  Neurologic: Protective sensation diminished bilateral.  Musculosketal: There is no pain to palpation to ulcerated area at right great toe.  There is pain to palpation to the right lateral ankle and limitation of range of motion at the right ankle.  No results for input(s): GRAMSTAIN, LABORGA in the last 8760 hours.  Assessment and Plan:  Problem List Items Addressed This Visit   None Visit Diagnoses     Acute osteomyelitis of toe, right (HCC)    -  Primary   Toe ulcer, right, limited to breakdown of skin ([place])       Capsulitis of right ankle       Diabetic polyneuropathy associated with type 2 diabetes mellitus ([place])       Acute right ankle pain          -Examined patient and discussed the progression of the wound and treatment alternatives. - Excisionally dedbrided ulceration at right great toe to healthy bleeding borders removing nonviable tissue using a sterile chisel blade. Wound measures post debridement as above, wound was debrided to the level of the dermis with viable wound base exposed to promote healing. Hemostasis was achieved with manuel pressure. Patient tolerated procedure well without any discomfort or anesthesia  necessary for this wound debridement.  -Applied Medihoney and dry sterile dressing and instructed patient to continue with daily dressings at home consisting of same daily like before -Ordered MRI to evaluate for osteomyelitis at right great toe -Ordered MRI to evaluate right ankle pain - Advised patient to go to the ER or return to office if the wound worsens or if constitutional symptoms are present.   -Continue with Bactrim until completed -Encourage patient to wear shoes that do not rub the toe meanwhile we will put a brace on the right ankle displays a Tri-Lock brace to see if that would help with his ankle pain -  Patient to return to office after MRI or sooner if problems arise.  Landis Martins, DPM

## 2020-09-01 ENCOUNTER — Telehealth: Payer: Self-pay

## 2020-09-01 ENCOUNTER — Other Ambulatory Visit: Payer: Self-pay | Admitting: Sports Medicine

## 2020-09-01 DIAGNOSIS — M25571 Pain in right ankle and joints of right foot: Secondary | ICD-10-CM

## 2020-09-01 DIAGNOSIS — M19079 Primary osteoarthritis, unspecified ankle and foot: Secondary | ICD-10-CM

## 2020-09-01 DIAGNOSIS — M86171 Other acute osteomyelitis, right ankle and foot: Secondary | ICD-10-CM

## 2020-09-01 DIAGNOSIS — M7751 Other enthesopathy of right foot: Secondary | ICD-10-CM

## 2020-09-01 NOTE — Telephone Encounter (Signed)
Contacted patient's insurance company to obtained authorization for MRI of Rt ankle and Rt foot w/o contrast. Authorization was required  Auth# [id number] Exp:09/30/20 MRI will only be approved to be performed at [place], pt states he is fine with location

## 2020-09-01 NOTE — Progress Notes (Signed)
Order placed for MRIs at [facility]

## 2020-09-16 ENCOUNTER — Ambulatory Visit
Admission: RE | Admit: 2020-09-16 | Discharge: 2020-09-16 | Disposition: A | Discharge status: Home / Self Care | Payer: Medicare HMO | Source: Ambulatory Visit | Attending: Sports Medicine | Admitting: Sports Medicine

## 2020-09-16 ENCOUNTER — Other Ambulatory Visit: Payer: Self-pay

## 2020-09-16 DIAGNOSIS — M86171 Other acute osteomyelitis, right ankle and foot: Secondary | ICD-10-CM | POA: Diagnosis not present

## 2020-09-16 DIAGNOSIS — E11621 Type 2 diabetes mellitus with foot ulcer: Secondary | ICD-10-CM | POA: Diagnosis not present

## 2020-09-16 DIAGNOSIS — L97519 Non-pressure chronic ulcer of other part of right foot with unspecified severity: Secondary | ICD-10-CM | POA: Diagnosis not present

## 2020-09-16 DIAGNOSIS — M19071 Primary osteoarthritis, right ankle and foot: Secondary | ICD-10-CM | POA: Diagnosis not present

## 2020-09-16 DIAGNOSIS — E119 Type 2 diabetes mellitus without complications: Secondary | ICD-10-CM | POA: Diagnosis not present

## 2020-09-16 DIAGNOSIS — M2011 Hallux valgus (acquired), right foot: Secondary | ICD-10-CM | POA: Diagnosis not present

## 2020-09-16 DIAGNOSIS — R600 Localized edema: Secondary | ICD-10-CM | POA: Diagnosis not present

## 2020-09-16 DIAGNOSIS — M19079 Primary osteoarthritis, unspecified ankle and foot: Secondary | ICD-10-CM

## 2020-09-16 IMAGING — MR MR ANKLE*R* W/O CM
6 series · 40 of 40 positions shown · non-contrast
Comparison: X-ray [DATE]

CLINICAL DATA: Chronic right ankle pain. History of diabetic foot
ulcer.

EXAM:
MRI OF THE RIGHT ANKLE WITHOUT CONTRAST
TECHNIQUE: Multiplanar, multisequence MR imaging of the ankle was performed. No
intravenous contrast was administered.

[Series 3: T2 fat-sat · axial · 3.0mm · 0.62mm/px · z∈[-37,+95]mm · 8 of 36 slices shown (1 of 2)]
[im 1/36]
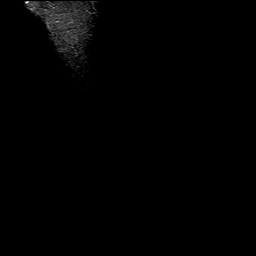
[im 6/36]
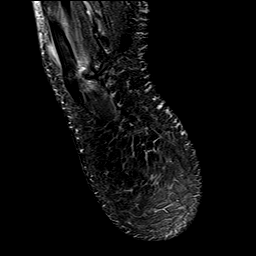
[im 11/36]
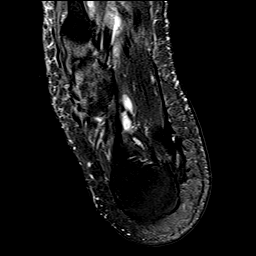
[im 16/36]
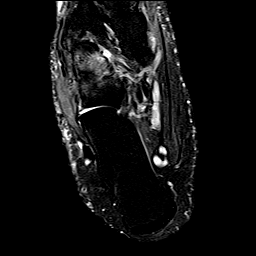
[im 21/36]
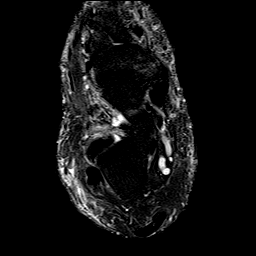
[im 26/36]
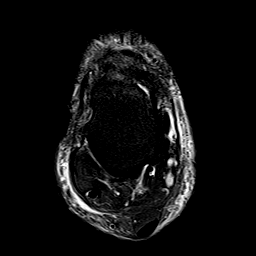
[im 31/36]
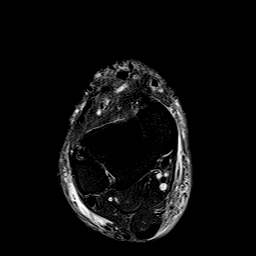
[im 36/36]
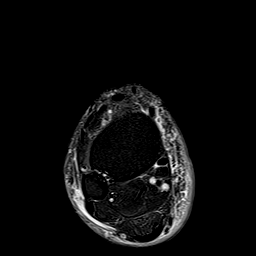

[Series 4: PD fat-sat · axial · 3.0mm · 0.62mm/px · z∈[-37,+95]mm · 8 of 36 slices shown]
[im 1/36]
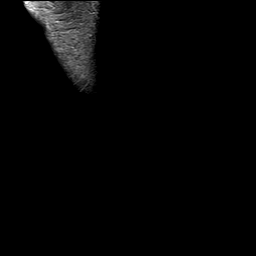
[im 6/36]
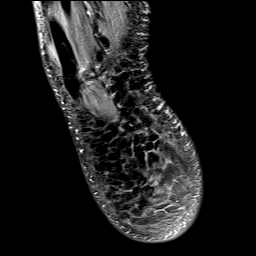
[im 11/36]
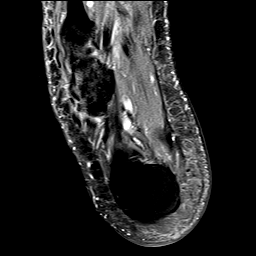
[im 16/36]
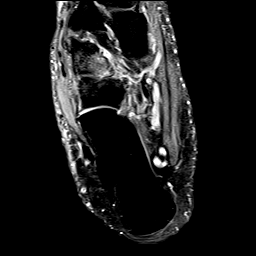
[im 21/36]
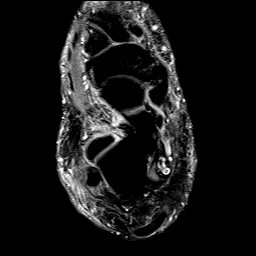
[im 26/36]
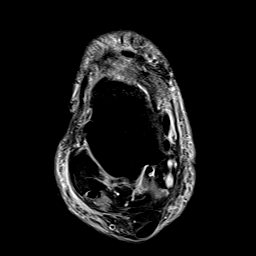
[im 31/36]
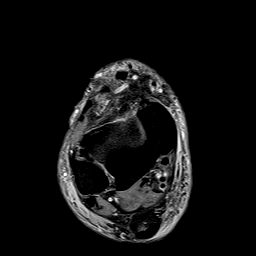
[im 36/36]
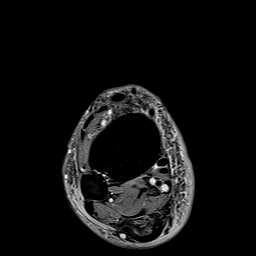

[Series 5: T1 · sagittal · 4.0mm · 0.56mm/px · 4 of 22 slices shown (1 of 2)]
[im 1/22]
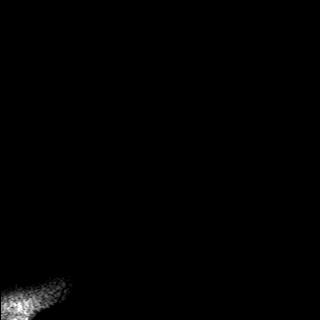
[im 8/22]
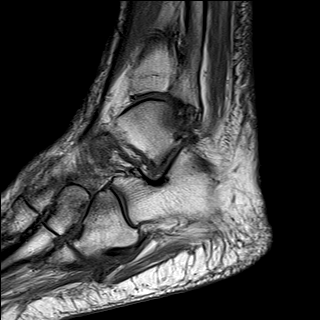
[im 15/22]
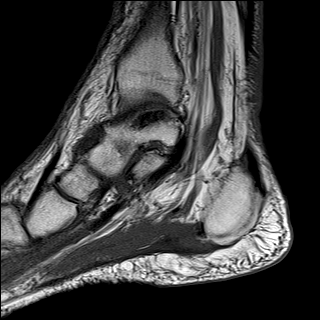
[im 22/22]
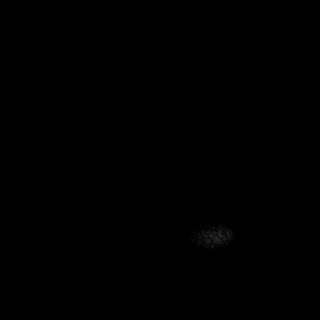

[Series 6: STIR · sagittal · 4.0mm · 0.35mm/px · 4 of 22 slices shown]
[im 1/22]
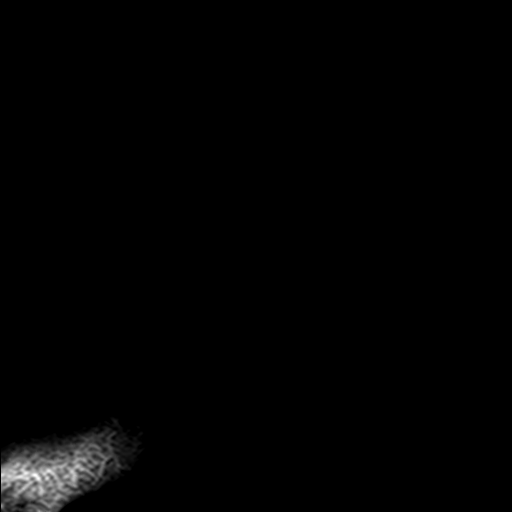
[im 8/22]
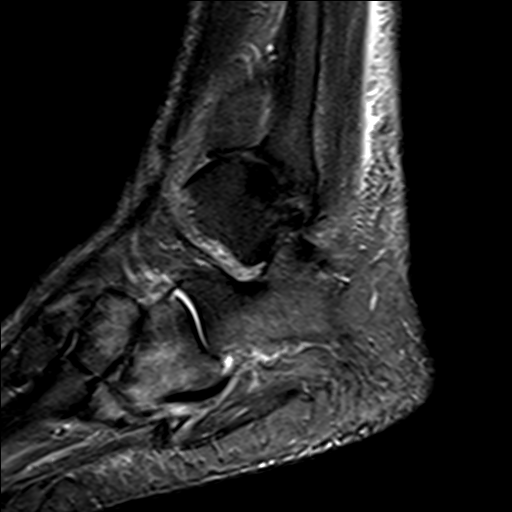
[im 15/22]
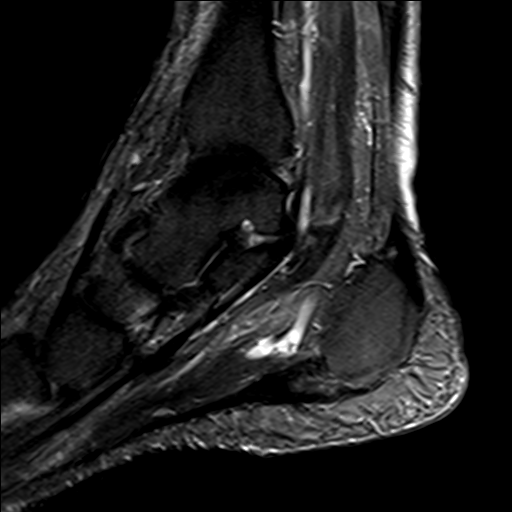
[im 22/22]
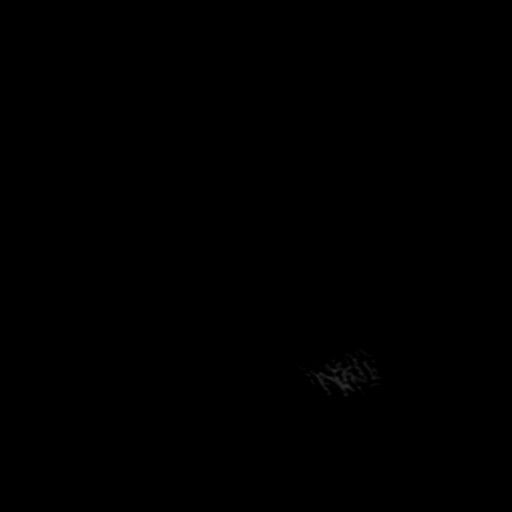

[Series 7: T2 fat-sat · coronal · 3.0mm · 0.62mm/px · 9 of 44 slices shown (2 of 2)]
[im 1/44]
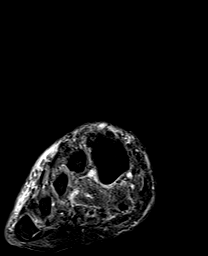
[im 6/44]
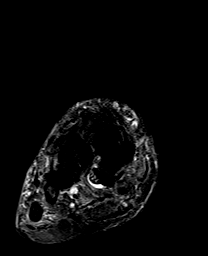
[im 11/44]
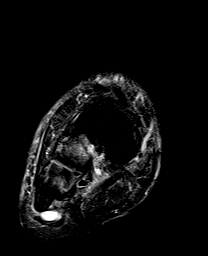
[im 17/44]
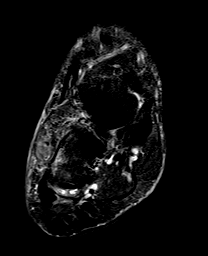
[im 22/44]
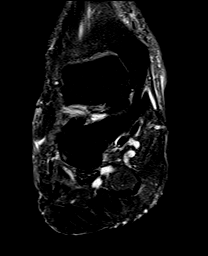
[im 27/44]
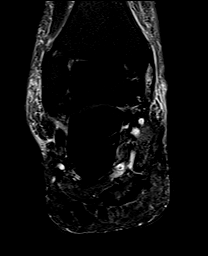
[im 33/44]
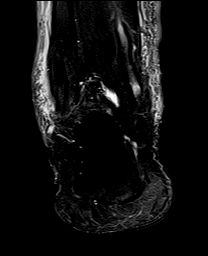
[im 38/44]
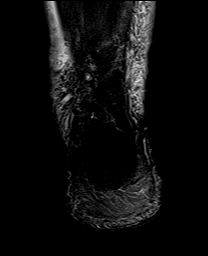
[im 44/44]
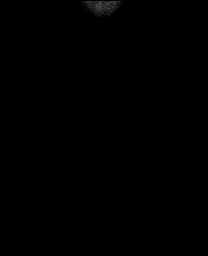

[Series 8: T1 · axial · 3.0mm · 0.62mm/px · z∈[-36,+96]mm · 7 of 36 slices shown (2 of 2)]
[im 1/36]
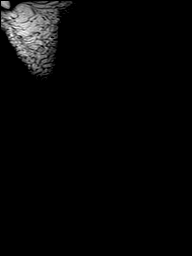
[im 6/36]
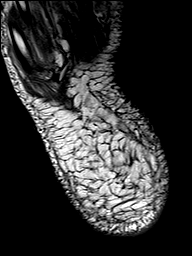
[im 12/36]
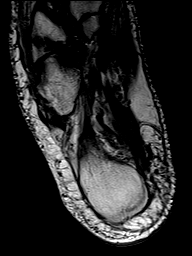
[im 18/36]
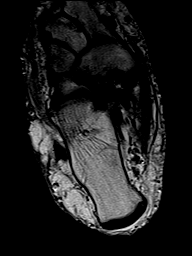
[im 24/36]
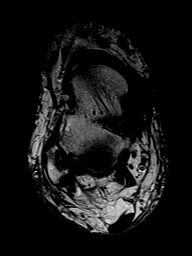
[im 30/36]
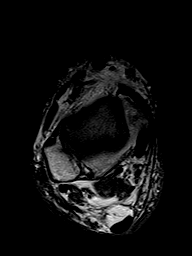
[im 36/36]
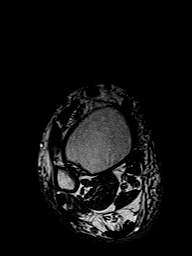

[40 of 40 positions shown; findings below may reference images not displayed]

FINDINGS: TENDONS

Peroneal: Intact peroneus longus and peroneus brevis tendons.

Posteromedial: Intact tibialis posterior, flexor hallucis longus and
flexor digitorum longus tendons. Trace tenosynovial fluid associated
with the tibialis posterior and flexor hallucis longus tendons.

Anterior: Intact tibialis anterior, extensor hallucis longus and
extensor digitorum longus tendons.

Achilles: Intact.

Plantar Fascia: Marked thickening with high-grade partial tear
involving the proximal aspect of the central band of the plantar
fascia.

LIGAMENTS

Lateral: The anterior and posterior tibiofibular ligaments are
intact. The anterior and posterior talofibular ligaments are intact.
Intact calcaneofibular ligament.

Medial: Deltoid ligament and spring ligament complex intact.

CARTILAGE

Ankle Joint: No joint effusion or chondral defect.

Subtalar Joints/Sinus Tarsi: No joint effusion or chondral defect.
Preservation of the anatomic fat within the sinus tarsi.

Bones: No acute fracture. No malalignment. Mild-to-moderate
degenerative changes of the fourth and fifth tarsometatarsal joints.
Patchy bone marrow edema within the cuboid, lateral cuneiform, and
bases of the fourth and fifth metatarsals. No well-defined fracture
line.

Other: There is a 2.1 x 0.5 x 1.4 cm cystic appearing structure
located along the plantar aspect of the fifth metatarsal proximal
metaphysis (series 6, image 5). No adjacent soft tissue edema or
ulceration. There is subcutaneous edema overlying the lateral
malleolus. No organized fluid collection.
IMPRESSION: 1. Marked thickening with high-grade partial tear involving the
proximal aspect of the central band of the plantar fascia.
2. Patchy bone marrow edema within the cuboid, lateral cuneiform,
and bases of the fourth and fifth metatarsals. No well-defined
fracture line. Findings may be reactive or reflect stress related
changes.
3. 2.1 cm cystic appearing structure along the plantar aspect of the
fifth metatarsal proximal metaphysis, likely representing a ganglion
cyst. A small abscess is considered less likely although not
entirely excluded by imaging alone.
4. Mild-to-moderate degenerative changes of the fourth and fifth
tarsometatarsal joints.

## 2020-09-16 IMAGING — MR MR FOOT*R* W/O CM
4 of 6 series · 23 of 40 positions shown · non-contrast
Comparison: X-ray [DATE]

CLINICAL DATA: Diabetic foot wound of the great toe

EXAM:
MRI OF THE RIGHT FOREFOOT WITHOUT CONTRAST
TECHNIQUE: Multiplanar, multisequence MR imaging of the right forefoot was
performed. No intravenous contrast was administered.

[Series 5: T1 · coronal · 3.0mm · 0.29mm/px · 7 of 44 slices shown (1 of 2)]
[im 1/44]
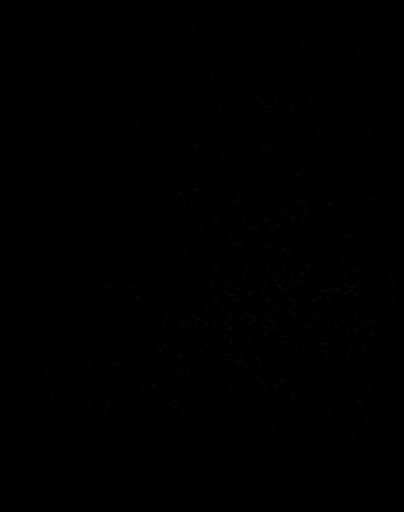
[im 6/44]
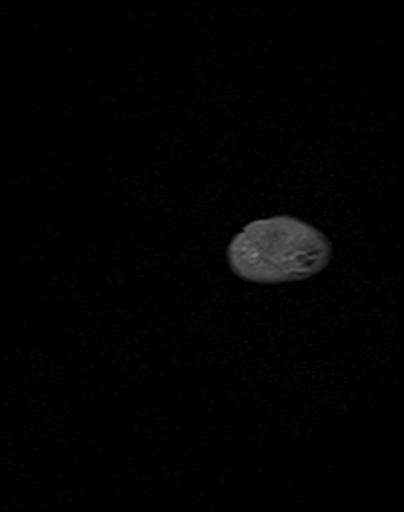
[im 11/44]
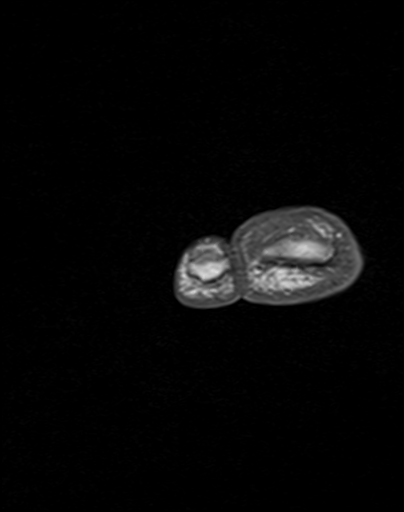
[im 17/44]
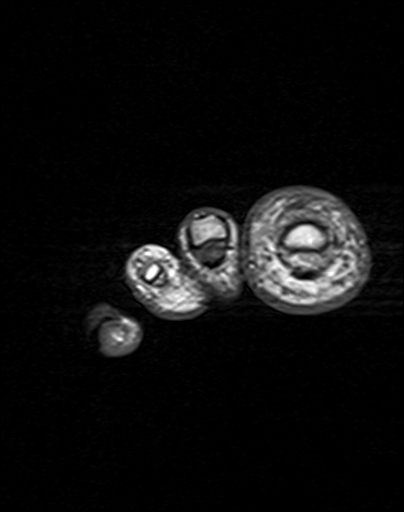
[im 22/44]
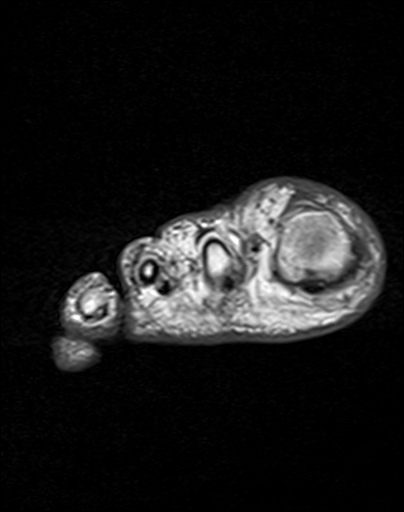
[im 27/44]
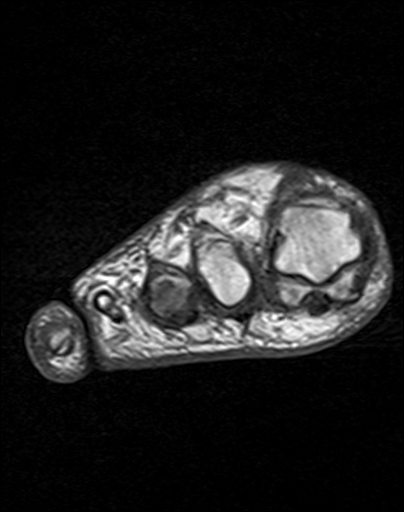
[im 38/44]
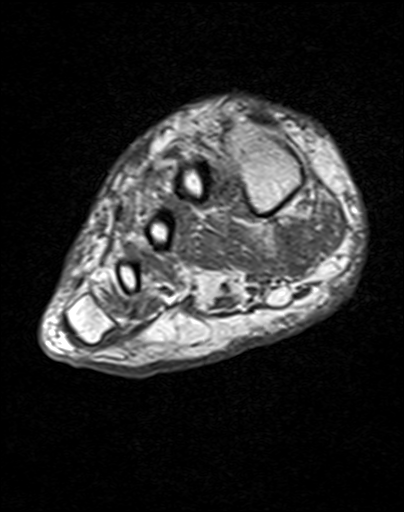

[Series 6: T2 fat-sat · coronal · 3.0mm · 0.28mm/px · 9 of 44 slices shown (1 of 2)]
[im 1/44]
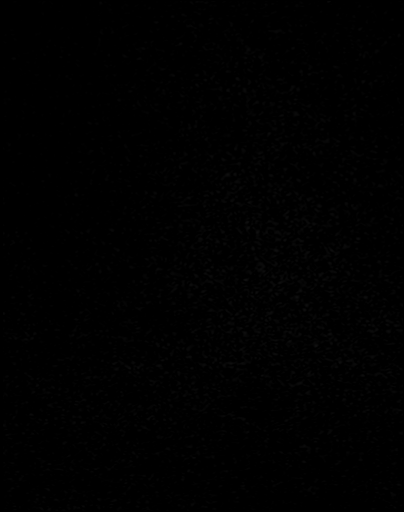
[im 6/44]
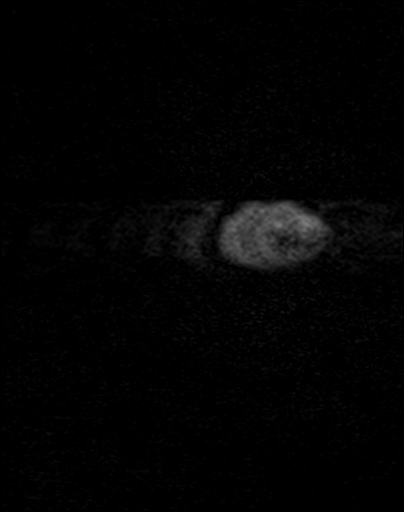
[im 11/44]
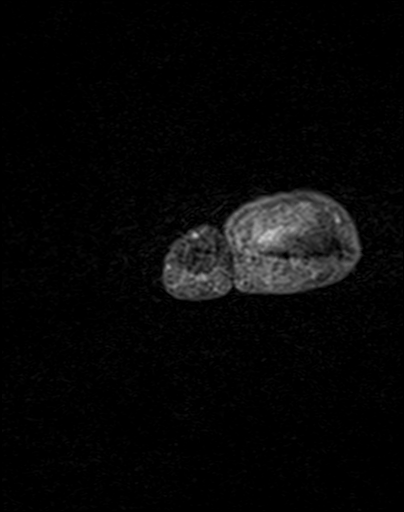
[im 17/44]
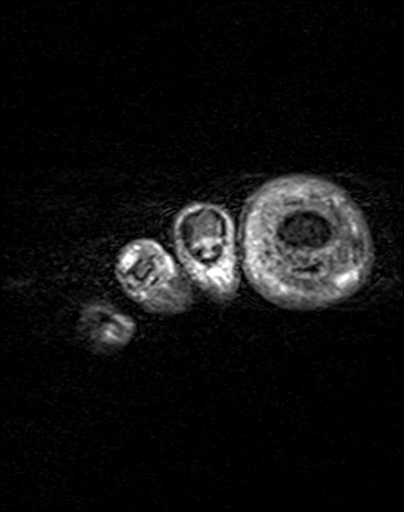
[im 22/44]
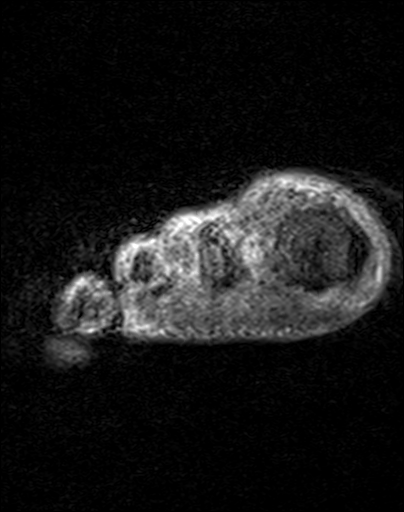
[im 27/44]
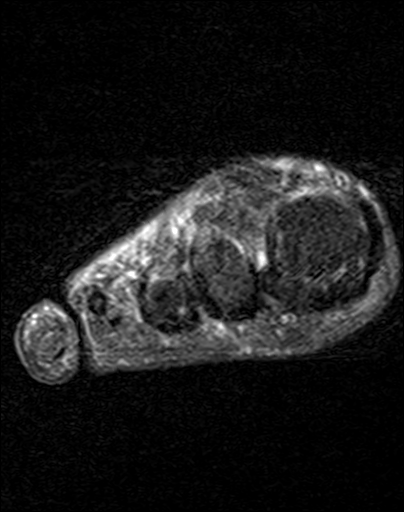
[im 33/44]
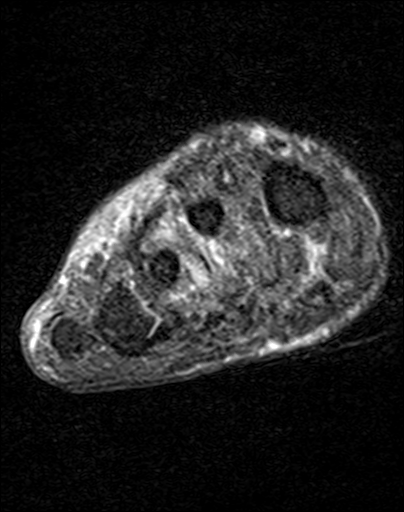
[im 38/44]
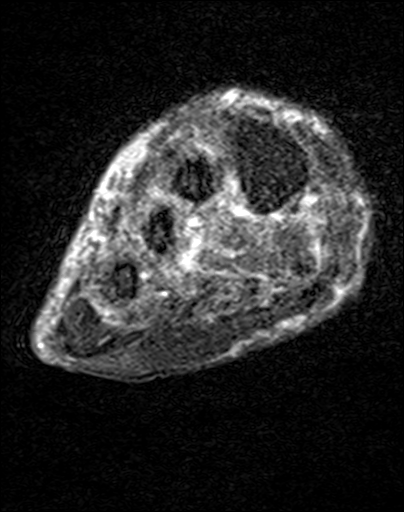
[im 44/44]
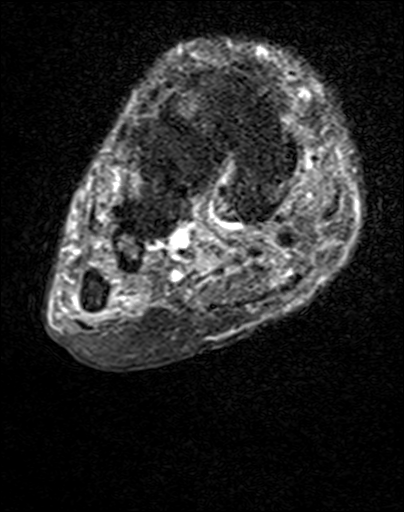

[Series 7: T2 fat-sat · axial · 3.0mm · 0.35mm/px · z∈[-101,-23]mm · 4 of 22 slices shown (2 of 2)]
[im 1/22]
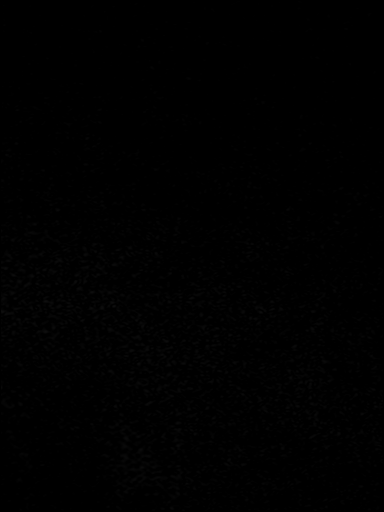
[im 8/22]
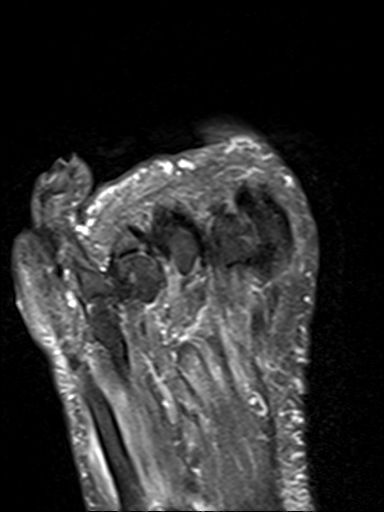
[im 15/22]
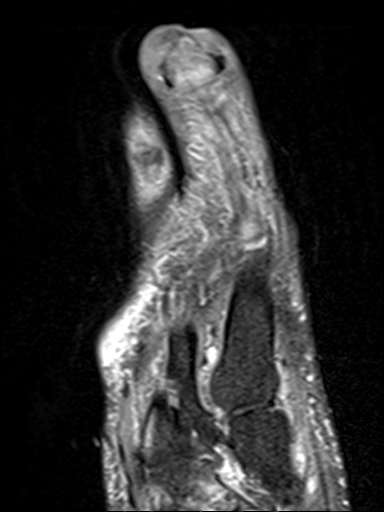
[im 22/22]
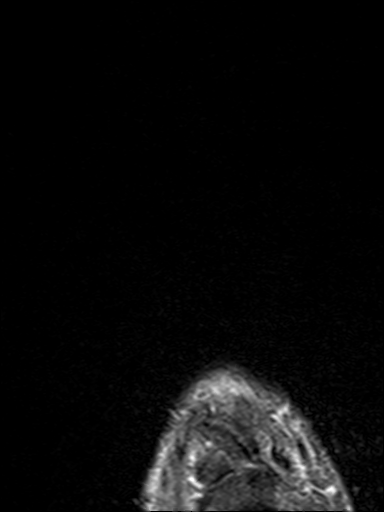

[Series 8: T1 · axial · 3.0mm · 0.35mm/px · z∈[-101,-23]mm · 3 of 22 slices shown (2 of 2)]
[im 1/22]
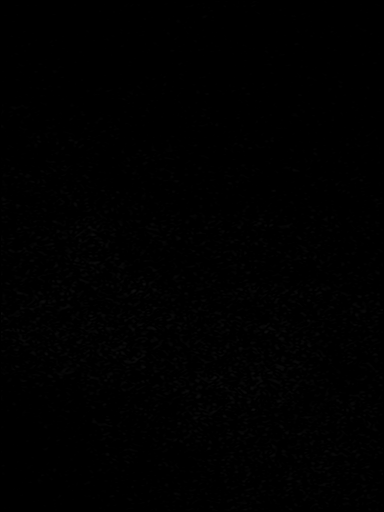
[im 15/22]
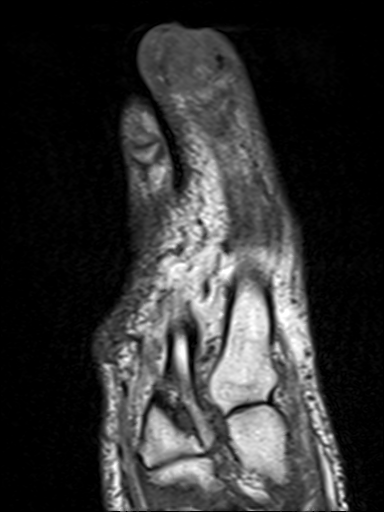
[im 22/22]
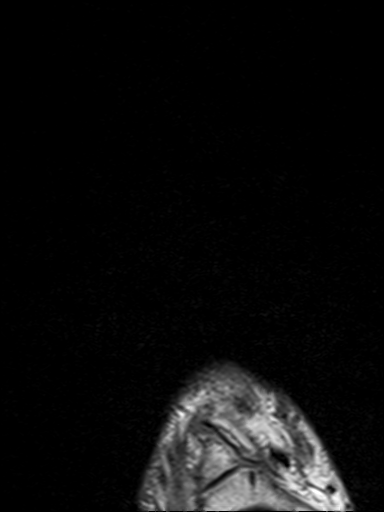

[23 of 40 positions shown; findings below may reference images not displayed]

FINDINGS: Technical Note: Despite efforts by the technologist and patient,
motion artifact is present on today's exam and could not be
eliminated. This reduces exam sensitivity and specificity.

Bones/Joint/Cartilage

There is bone marrow edema throughout the distal phalanx of the
right great toe with associated intermediate to low T1 marrow signal
(series 8, images 9-10) compatible with acute osteomyelitis. No
additional sites of osteomyelitis are seen involving the remainder
of the right forefoot.

Marrow edema is also present at the fourth and fifth metatarsal
bases as well as within the visualized lateral cuneiform and cuboid,
better characterized on dedicated ankle MRI.

Hallux valgus deformity with moderate osteoarthritis of the first
MTP joint. No joint effusions.

Ligaments

Intact Lisfranc ligament. Collateral ligaments of the forefoot
appear intact.

Muscles and Tendons

Edema-like signal throughout the intrinsic foot musculature which
may reflect myositis or denervation changes. Mild fatty infiltration
of the intrinsic foot musculature. Grossly intact flexor and
extensor tendons. No tenosynovitis.

Soft tissues

Soft tissue ulceration at the distal aspect of the right great toe.
Surrounding soft tissue edema. Small fluid collection along the
plantar aspect of the fifth metatarsal proximal metaphysis, as
described on ankle MRI. No additional fluid collections.
IMPRESSION: 1. Soft tissue ulceration at the distal aspect of the right great
toe with underlying acute osteomyelitis of the distal phalanx of the
right great toe.
2. Marrow edema at the fourth and fifth metatarsal bases as well as
within the visualized lateral cuneiform and cuboid, better
characterized on dedicated ankle MRI.
3. Small fluid collection along the plantar aspect of the fifth
metatarsal proximal metaphysis, as described on dedicated ankle MRI.
4. Hallux valgus deformity with moderate osteoarthritis of the first
MTP joint.

These results will be called to the ordering clinician or
representative by the Radiologist Assistant, and communication
documented in the PACS or [REDACTED].

## 2020-09-20 ENCOUNTER — Encounter: Payer: Self-pay | Admitting: Sports Medicine

## 2020-09-20 ENCOUNTER — Other Ambulatory Visit: Payer: Self-pay

## 2020-09-20 ENCOUNTER — Telehealth: Payer: Self-pay | Admitting: Sports Medicine

## 2020-09-20 ENCOUNTER — Ambulatory Visit: Payer: Medicare HMO | Admitting: Sports Medicine

## 2020-09-20 DIAGNOSIS — E1142 Type 2 diabetes mellitus with diabetic polyneuropathy: Secondary | ICD-10-CM

## 2020-09-20 DIAGNOSIS — L97514 Non-pressure chronic ulcer of other part of right foot with necrosis of bone: Secondary | ICD-10-CM

## 2020-09-20 DIAGNOSIS — M86171 Other acute osteomyelitis, right ankle and foot: Secondary | ICD-10-CM

## 2020-09-20 DIAGNOSIS — M2041 Other hammer toe(s) (acquired), right foot: Secondary | ICD-10-CM

## 2020-09-20 DIAGNOSIS — L97511 Non-pressure chronic ulcer of other part of right foot limited to breakdown of skin: Secondary | ICD-10-CM

## 2020-09-20 NOTE — Progress Notes (Signed)
Subjective: Robert Santiago is a 70 y.o. diabetic male patient seen in office for follow-up evaluation of ulceration of the right great toe.  Patient reports that wound seems to be getting smaller but the toe drains and looks funny.  Also reports that his right fifth toe has developed an open back up.  States that his ankle pain is doing better Voltaren and Aleve arthritis seems to help some.  Fasting blood sugar this morning not recorded A1c 7.8 Patient Active Problem List   Diagnosis Date Noted   Diabetes mellitus without complication ([place])    Acquired hypothyroidism 03/19/2019   Atherosclerosis of autologous vein coronary artery bypass graft(s) with unstable angina pectoris ([place]) 03/19/2019   Encounter for screening for diabetes mellitus 03/19/2019   Status post cardiac catheterization 03/19/2019   Type 2 diabetes mellitus without complication ([place]) 84/13/2440   Coronary artery disease involving native coronary artery of native heart with angina pectoris ([place]) 03/19/2019   Coronary artery disease of native artery of native heart with stable angina pectoris ([place])    Hyperlipidemia    Essential hypertension    Progressive angina (HCC)-class III 12/19/2018   COVID-19 08/2018   Hx of CABG 08/23/2014   Multiple vessel coronary artery disease 08/21/2014   Current Outpatient Medications on File Prior to Visit  Medication Sig Dispense Refill   acetaminophen (TYLENOL) 500 MG tablet Take 1,000 mg by mouth every 6 (six) hours as needed for moderate pain or headache.     ALPRAZolam (XANAX) 0.25 MG tablet Take 0.25 mg by mouth daily as needed for anxiety.      aspirin EC 81 MG tablet Take 81 mg by mouth daily.     BD PEN NEEDLE NANO U/F 32G X 4 MM MISC      benazepril (LOTENSIN) 20 MG tablet Take 20 mg by mouth daily.     citalopram (CELEXA) 40 MG tablet Take 40 mg by mouth daily.     clopidogrel (PLAVIX) 75 MG tablet Take 1 tablet (75 mg total) by mouth daily. 90 tablet 3    clotrimazole-betamethasone (LOTRISONE) cream Apply 1 application topically daily as needed for rash.     ezetimibe (ZETIA) 10 MG tablet Take 1 tablet (10 mg total) by mouth daily. 90 tablet 3   fluticasone (FLONASE) 50 MCG/ACT nasal spray Place 1 spray into both nostrils daily as needed for allergies or rhinitis.     gabapentin (NEURONTIN) 300 MG capsule Take 1 capsule (300 mg total) by mouth 2 (two) times daily. 90 capsule 3   glimepiride (AMARYL) 4 MG tablet Take 4 mg by mouth 2 (two) times daily.      JANUVIA 100 MG tablet      LANTUS SOLOSTAR 100 UNIT/ML Solostar Pen 40 Units daily.     levothyroxine (SYNTHROID) 175 MCG tablet Take 175 mcg by mouth daily.      meloxicam (MOBIC) 15 MG tablet      metFORMIN (GLUCOPHAGE) 850 MG tablet Take 850 mg by mouth 2 (two) times daily.      metoprolol tartrate (LOPRESSOR) 25 MG tablet Take 25 mg by mouth 2 (two) times daily.      nitroGLYCERIN (NITROSTAT) 0.4 MG SL tablet Place 1 tablet (0.4 mg total) under the tongue every 5 (five) minutes as needed for chest pain. 25 tablet 3   omeprazole (PRILOSEC) 40 MG capsule Take 40 mg by mouth daily.     rosuvastatin (CRESTOR) 10 MG tablet Take 1 tablet (10 mg total) by mouth daily.  90 tablet 3   sulfamethoxazole-trimethoprim (BACTRIM DS) 800-160 MG tablet Take 1 tablet by mouth 2 (two) times daily. 28 tablet 0   Tetrahydrozoline HCl (VISINE OP) Place 1 drop into both eyes at bedtime as needed (irritation).     Current Facility-Administered Medications on File Prior to Visit  Medication Dose Route Frequency Provider Last Rate Last Admin   triamcinolone acetonide (KENALOG) 10 MG/ML injection 10 mg  10 mg Other Once Landis Martins, DPM       triamcinolone acetonide (KENALOG) 10 MG/ML injection 10 mg  10 mg Other Once Landis Martins, DPM       No Known Allergies  No results found for this or any previous visit (from the past 2160 hour(s)).  Objective: There were no vitals filed for this visit.  General:  Patient is awake, alert, oriented x 3 and in no acute distress.  Dermatology: Skin is warm and dry bilateral with a partial thickness ulceration present right great toe ulceration measures 1.4 cm x 1.5 cm x 0.2 cm unchanged from previous. There is a Keratotic border with a fibrogranular base. The ulceration does not probe to bone but there is fatty tissue exposed. There is no malodor, no active drainage, localized erythema, localized edema.   Right fifth toe there is a pinpoint ulceration at the dorsal interphalangeal joint with hammertoe deformity.  Vascular: Dorsalis Pedis pulse = 1/4 Bilateral,  Posterior Tibial pulse = 1/4 Bilateral,  Capillary Fill Time < 5 seconds 1+ pitting edema to right ankle.  Neurologic: Protective sensation diminished bilateral.  Musculosketal: There is no pain to palpation to ulcerated area at right great toe or fifth toe.  There is reduced pain to palpation to the right lateral ankle and limitation of range of motion at the right ankle.  No results for input(s): GRAMSTAIN, LABORGA in the last 8760 hours.  Assessment and Plan:  Problem List Items Addressed This Visit   None Visit Diagnoses     Other acute osteomyelitis of right foot ([place])    -  Primary   Toe ulcer, right, with necrosis of bone ([place])       Toe ulcer, right, limited to breakdown of skin ([place])       Diabetic polyneuropathy associated with type 2 diabetes mellitus ([place])       Hammertoe of right foot          -Examined patient and discussed the progression of the wound and treatment alternatives. - Excisionally dedbrided ulceration at right great toe to healthy bleeding borders removing nonviable tissue using a sterile chisel blade. Wound measures post debridement as above, wound was debrided to the level of the dermis with viable wound base exposed to promote healing. Hemostasis was achieved with manuel pressure. Patient tolerated procedure well without any discomfort or anesthesia necessary for  this wound debridement.  -Applied Iodosorb to the great toe and right fifth toe and instructed patient to continue with daily dressings at home consisting of same daily like before and avoid getting foot wet in the shower-Ordered MRI to evaluate for osteomyelitis at right great toe -MRI results reviewed with patient -Started patient on Nuzyra antibiotic for the next 8 days -Patient opt for surgical management. Consent obtained for partial right great toe amputation and removal of bone at right fifth toe. Pre and Post op course explained. Risks, benefits, alternatives explained. No guarantees given or implied. Surgical booking slip submitted and provided patient with Surgical packet and info for [facility] -Advised patient to resume using  surgical shoe that he has at home -Continue with arthritis Aleve and topical Voltaren for ankle -Patient to return to office 7 to 10 days for follow-up or sooner if problems or issues arise Landis Martins, DPM

## 2020-09-20 NOTE — Telephone Encounter (Signed)
The results are in [place]. "Soft tissue ulceration at the distal aspect of the right great toe. Surrounding soft tissue edema. Small fluid collection along the plantar aspect of the fifth metatarsal proximal metaphysis, as described on ankle MRI. No additional fluid collections."

## 2020-09-22 ENCOUNTER — Telehealth: Payer: Self-pay

## 2020-09-22 NOTE — Telephone Encounter (Signed)
Received surgery paperwork from the [facility] office. Left a message for Robert Santiago to call and schedule surgery with Dr. Cannon Kettle

## 2020-09-26 DIAGNOSIS — K219 Gastro-esophageal reflux disease without esophagitis: Secondary | ICD-10-CM | POA: Diagnosis not present

## 2020-09-26 DIAGNOSIS — E1169 Type 2 diabetes mellitus with other specified complication: Secondary | ICD-10-CM | POA: Diagnosis not present

## 2020-09-26 DIAGNOSIS — J309 Allergic rhinitis, unspecified: Secondary | ICD-10-CM | POA: Diagnosis not present

## 2020-09-26 DIAGNOSIS — E785 Hyperlipidemia, unspecified: Secondary | ICD-10-CM | POA: Diagnosis not present

## 2020-09-26 DIAGNOSIS — E291 Testicular hypofunction: Secondary | ICD-10-CM | POA: Diagnosis not present

## 2020-09-26 DIAGNOSIS — I2510 Atherosclerotic heart disease of native coronary artery without angina pectoris: Secondary | ICD-10-CM | POA: Diagnosis not present

## 2020-09-26 DIAGNOSIS — F3341 Major depressive disorder, recurrent, in partial remission: Secondary | ICD-10-CM | POA: Diagnosis not present

## 2020-09-26 DIAGNOSIS — I10 Essential (primary) hypertension: Secondary | ICD-10-CM | POA: Diagnosis not present

## 2020-09-28 ENCOUNTER — Ambulatory Visit: Payer: Medicare HMO | Admitting: Sports Medicine

## 2020-09-28 ENCOUNTER — Other Ambulatory Visit: Payer: Self-pay

## 2020-09-28 ENCOUNTER — Encounter: Payer: Self-pay | Admitting: Sports Medicine

## 2020-09-28 DIAGNOSIS — L97511 Non-pressure chronic ulcer of other part of right foot limited to breakdown of skin: Secondary | ICD-10-CM | POA: Diagnosis not present

## 2020-09-28 DIAGNOSIS — M86171 Other acute osteomyelitis, right ankle and foot: Secondary | ICD-10-CM

## 2020-09-28 DIAGNOSIS — E1142 Type 2 diabetes mellitus with diabetic polyneuropathy: Secondary | ICD-10-CM

## 2020-09-28 DIAGNOSIS — L97514 Non-pressure chronic ulcer of other part of right foot with necrosis of bone: Secondary | ICD-10-CM | POA: Diagnosis not present

## 2020-09-28 NOTE — Progress Notes (Signed)
Subjective: Robert Santiago is a 70 y.o. diabetic male patient seen in office for follow-up evaluation of ulceration of the right great toe.  Patient reports that wound seems to be draining less and reports that he has not talked to [facility] about his surgery but has decided that he does want to move forward with the but is very busy right now and is wanting to see how long he can hold off before getting it.  Fasting blood sugar this morning not recorded A1c 7.8 Patient Active Problem List   Diagnosis Date Noted   Diabetes mellitus without complication ([place])    Acquired hypothyroidism 03/19/2019   Atherosclerosis of autologous vein coronary artery bypass graft(s) with unstable angina pectoris ([place]) 03/19/2019   Encounter for screening for diabetes mellitus 03/19/2019   Status post cardiac catheterization 03/19/2019   Type 2 diabetes mellitus without complication ([place]) 22/48/2500   Coronary artery disease involving native coronary artery of native heart with angina pectoris ([place]) 03/19/2019   Coronary artery disease of native artery of native heart with stable angina pectoris ([place])    Hyperlipidemia    Essential hypertension    Progressive angina (HCC)-class III 12/19/2018   COVID-19 08/2018   Hx of CABG 08/23/2014   Multiple vessel coronary artery disease 08/21/2014   Current Outpatient Medications on File Prior to Visit  Medication Sig Dispense Refill   acetaminophen (TYLENOL) 500 MG tablet Take 1,000 mg by mouth every 6 (six) hours as needed for moderate pain or headache.     ALPRAZolam (XANAX) 0.25 MG tablet Take 0.25 mg by mouth daily as needed for anxiety.      aspirin EC 81 MG tablet Take 81 mg by mouth daily.     BD PEN NEEDLE NANO U/F 32G X 4 MM MISC      benazepril (LOTENSIN) 20 MG tablet Take 20 mg by mouth daily.     citalopram (CELEXA) 40 MG tablet Take 40 mg by mouth daily.     clopidogrel (PLAVIX) 75 MG tablet Take 1 tablet (75 mg total) by mouth daily. 90 tablet 3    clotrimazole-betamethasone (LOTRISONE) cream Apply 1 application topically daily as needed for rash.     ezetimibe (ZETIA) 10 MG tablet Take 1 tablet (10 mg total) by mouth daily. 90 tablet 3   fluticasone (FLONASE) 50 MCG/ACT nasal spray Place 1 spray into both nostrils daily as needed for allergies or rhinitis.     gabapentin (NEURONTIN) 300 MG capsule Take 1 capsule (300 mg total) by mouth 2 (two) times daily. 90 capsule 3   glimepiride (AMARYL) 4 MG tablet Take 4 mg by mouth 2 (two) times daily.      JANUVIA 100 MG tablet      LANTUS SOLOSTAR 100 UNIT/ML Solostar Pen 40 Units daily.     levothyroxine (SYNTHROID) 175 MCG tablet Take 175 mcg by mouth daily.      meloxicam (MOBIC) 15 MG tablet      metFORMIN (GLUCOPHAGE) 850 MG tablet Take 850 mg by mouth 2 (two) times daily.      metoprolol tartrate (LOPRESSOR) 25 MG tablet Take 25 mg by mouth 2 (two) times daily.      nitroGLYCERIN (NITROSTAT) 0.4 MG SL tablet Place 1 tablet (0.4 mg total) under the tongue every 5 (five) minutes as needed for chest pain. 25 tablet 3   omeprazole (PRILOSEC) 40 MG capsule Take 40 mg by mouth daily.     rosuvastatin (CRESTOR) 10 MG tablet Take 1 tablet (  10 mg total) by mouth daily. 90 tablet 3   sulfamethoxazole-trimethoprim (BACTRIM DS) 800-160 MG tablet Take 1 tablet by mouth 2 (two) times daily. 28 tablet 0   Tetrahydrozoline HCl (VISINE OP) Place 1 drop into both eyes at bedtime as needed (irritation).     Current Facility-Administered Medications on File Prior to Visit  Medication Dose Route Frequency Provider Last Rate Last Admin   triamcinolone acetonide (KENALOG) 10 MG/ML injection 10 mg  10 mg Other Once Landis Martins, DPM       triamcinolone acetonide (KENALOG) 10 MG/ML injection 10 mg  10 mg Other Once Landis Martins, DPM       No Known Allergies  No results found for this or any previous visit (from the past 2160 hour(s)).  Objective: There were no vitals filed for this visit.  General:  Patient is awake, alert, oriented x 3 and in no acute distress.  Dermatology: Skin is warm and dry bilateral with a partial thickness ulceration present right great toe ulceration measures less than 1 cm x 2 at the distal tuft and now medial plantar hallux with a fiber granular base with fatty tissue exposed macerated keratotic margins and localized blanchable erythema. The ulceration does not probe to bone but there is fatty tissue exposed. There is no malodor, no active drainage, localized erythema, localized edema.   Right fifth toe there is a pinpoint ulceration at the dorsal interphalangeal joint with hammertoe deformity unchanged from last visit.  Vascular: Dorsalis Pedis pulse = 1/4 Bilateral,  Posterior Tibial pulse = 1/4 Bilateral,  Capillary Fill Time < 5 seconds 1+ pitting edema to right ankle.  Neurologic: Protective sensation diminished bilateral.  Musculosketal: There is no pain to palpation to ulcerated area at right great toe bunion mild pain to right fifth toe.  There is reduced pain to palpation to the right lateral ankle and limitation of range of motion at the right ankle.  No results for input(s): GRAMSTAIN, LABORGA in the last 8760 hours.  Assessment and Plan:  Problem List Items Addressed This Visit   None Visit Diagnoses     Other acute osteomyelitis of right foot ([place])    -  Primary   Toe ulcer, right, with necrosis of bone ([place])       Toe ulcer, right, limited to breakdown of skin ([place])       Diabetic polyneuropathy associated with type 2 diabetes mellitus ([place])           -Examined patient and discussed the progression of the wound and treatment alternatives. - Excisionally dedbrided ulceration at right great toe to healthy bleeding borders removing nonviable tissue using a sterile chisel blade. Wound measures post debridement as above, wound was debrided to the level of the dermis with viable wound base exposed to promote healing. Hemostasis was achieved with  manuel pressure. Patient tolerated procedure well without any discomfort or anesthesia necessary for this wound debridement.  -Applied Iodosorb to the right great toe and right fifth toe and instructed patient to continue with daily dressings at home consisting of same daily like before and avoid getting foot wet in the shower and to refrain from getting the feet with with ocean water he will be going out of town today -Discussed with patient the importance of follow-up with a quick decision for surgery advised patient that I would not wait more than 2 weeks to make this decision about signing up for surgery surgery paperwork has already been faxed to my scheduler and  we are awaiting patient to talk with her to get a date for surgery.  I encourage patient to talk with wife to see if they can hire some additional help because he will need at minimum 3 weeks out of work after surgery. -Continue Nzuyra until completed has 2 more days left -Advised patient to resume using surgical shoe that he has at home however patient still comes to clinic in a normal shoe -Patient to return to office 7 to 10 days for follow-up or sooner if problems or issues arise Landis Martins, DPM

## 2020-10-04 ENCOUNTER — Telehealth: Payer: Self-pay | Admitting: Urology

## 2020-10-04 NOTE — Telephone Encounter (Signed)
DOS - 10/24/20   HAMMERTOE REPAIR 5TH RIGHT --- 61607 AMPUTATION TOE IPJ 1ST RIGHT --- [id number]   [employer] EFFECTIVE DATE - 09/15/17   PER [place] WEBSITE FOR CPT CODE 26948 NO PRIOR AUTH IS REQUIRED FOR CPT CODE 54627 HAS BEEN APPROVED, AUTH # [id number].

## 2020-10-05 ENCOUNTER — Ambulatory Visit: Payer: Medicare HMO | Admitting: Sports Medicine

## 2020-10-05 ENCOUNTER — Encounter: Payer: Self-pay | Admitting: Sports Medicine

## 2020-10-05 ENCOUNTER — Other Ambulatory Visit: Payer: Self-pay

## 2020-10-05 DIAGNOSIS — M86171 Other acute osteomyelitis, right ankle and foot: Secondary | ICD-10-CM

## 2020-10-05 DIAGNOSIS — L97514 Non-pressure chronic ulcer of other part of right foot with necrosis of bone: Secondary | ICD-10-CM

## 2020-10-05 DIAGNOSIS — E1142 Type 2 diabetes mellitus with diabetic polyneuropathy: Secondary | ICD-10-CM | POA: Diagnosis not present

## 2020-10-05 DIAGNOSIS — L97511 Non-pressure chronic ulcer of other part of right foot limited to breakdown of skin: Secondary | ICD-10-CM | POA: Diagnosis not present

## 2020-10-05 DIAGNOSIS — L02611 Cutaneous abscess of right foot: Secondary | ICD-10-CM

## 2020-10-05 DIAGNOSIS — L03031 Cellulitis of right toe: Secondary | ICD-10-CM

## 2020-10-05 MED ORDER — NUZYRA 150 MG PO TABS: 300.0000 mg | ORAL_TABLET | Freq: Every day | ORAL | 0 refills | Status: DC

## 2020-10-05 NOTE — Progress Notes (Signed)
Subjective: Robert Santiago is a 70 y.o. diabetic male patient seen in office for follow-up evaluation of ulceration of the right great toe.  Patient on Nzurya for a few simple doses and states that it is helped but now that he is off of the medication the foot is swelling back up and getting red and he is having increased pain in his ankle.  Fasting blood sugar this morning not recorded A1c 7.8 Patient Active Problem List   Diagnosis Date Noted   Diabetes mellitus without complication ([place])    Acquired hypothyroidism 03/19/2019   Atherosclerosis of autologous vein coronary artery bypass graft(s) with unstable angina pectoris ([place]) 03/19/2019   Encounter for screening for diabetes mellitus 03/19/2019   Status post cardiac catheterization 03/19/2019   Type 2 diabetes mellitus without complication ([place]) 54/65/0354   Coronary artery disease involving native coronary artery of native heart with angina pectoris ([place]) 03/19/2019   Coronary artery disease of native artery of native heart with stable angina pectoris ([place])    Hyperlipidemia    Essential hypertension    Progressive angina (HCC)-class III 12/19/2018   COVID-19 08/2018   Hx of CABG 08/23/2014   Multiple vessel coronary artery disease 08/21/2014   Current Outpatient Medications on File Prior to Visit  Medication Sig Dispense Refill   acetaminophen (TYLENOL) 500 MG tablet Take 1,000 mg by mouth every 6 (six) hours as needed for moderate pain or headache.     ALPRAZolam (XANAX) 0.25 MG tablet Take 0.25 mg by mouth daily as needed for anxiety.      aspirin EC 81 MG tablet Take 81 mg by mouth daily.     BD PEN NEEDLE NANO U/F 32G X 4 MM MISC      benazepril (LOTENSIN) 20 MG tablet Take 20 mg by mouth daily.     citalopram (CELEXA) 40 MG tablet Take 40 mg by mouth daily.     clopidogrel (PLAVIX) 75 MG tablet Take 1 tablet (75 mg total) by mouth daily. 90 tablet 3   clotrimazole-betamethasone (LOTRISONE) cream Apply 1 application  topically daily as needed for rash.     ezetimibe (ZETIA) 10 MG tablet Take 1 tablet (10 mg total) by mouth daily. 90 tablet 3   fluticasone (FLONASE) 50 MCG/ACT nasal spray Place 1 spray into both nostrils daily as needed for allergies or rhinitis.     gabapentin (NEURONTIN) 300 MG capsule Take 1 capsule (300 mg total) by mouth 2 (two) times daily. 90 capsule 3   glimepiride (AMARYL) 4 MG tablet Take 4 mg by mouth 2 (two) times daily.      JANUVIA 100 MG tablet      LANTUS SOLOSTAR 100 UNIT/ML Solostar Pen 40 Units daily.     levothyroxine (SYNTHROID) 175 MCG tablet Take 175 mcg by mouth daily.      meloxicam (MOBIC) 15 MG tablet      metFORMIN (GLUCOPHAGE) 850 MG tablet Take 850 mg by mouth 2 (two) times daily.      metoprolol tartrate (LOPRESSOR) 25 MG tablet Take 25 mg by mouth 2 (two) times daily.      nitroGLYCERIN (NITROSTAT) 0.4 MG SL tablet Place 1 tablet (0.4 mg total) under the tongue every 5 (five) minutes as needed for chest pain. 25 tablet 3   omeprazole (PRILOSEC) 40 MG capsule Take 40 mg by mouth daily.     rosuvastatin (CRESTOR) 10 MG tablet Take 1 tablet (10 mg total) by mouth daily. 90 tablet 3   sulfamethoxazole-trimethoprim (  BACTRIM DS) 800-160 MG tablet Take 1 tablet by mouth 2 (two) times daily. 28 tablet 0   Tetrahydrozoline HCl (VISINE OP) Place 1 drop into both eyes at bedtime as needed (irritation).     Current Facility-Administered Medications on File Prior to Visit  Medication Dose Route Frequency Provider Last Rate Last Admin   triamcinolone acetonide (KENALOG) 10 MG/ML injection 10 mg  10 mg Other Once Landis Martins, DPM       triamcinolone acetonide (KENALOG) 10 MG/ML injection 10 mg  10 mg Other Once Landis Martins, DPM       No Known Allergies  No results found for this or any previous visit (from the past 2160 hour(s)).  Objective: There were no vitals filed for this visit.  General: Patient is awake, alert, oriented x 3 and in no acute  distress.  Dermatology: Skin is warm and dry bilateral with a partial thickness ulceration present right great toe ulceration measures less than 1 cm x 2 at the distal tuft and plantar medial hallux with a fiber granular base with fatty tissue exposed macerated keratotic margins and localized blanchable erythema to the level of the MPJ. The ulceration does probes to capsule. There is no malodor, no active drainage, localized erythema, localized edema.   Right fifth toe there is a pinpoint ulceration at the dorsal interphalangeal joint with hammertoe deformity unchanged from last visit.   Vascular: Dorsalis Pedis pulse = 1/4 Bilateral,  Posterior Tibial pulse = 1/4 Bilateral,  Capillary Fill Time < 5 seconds 1+ pitting edema to right ankle. Swelling to the right lower leg but no pain with calf negative Homans' sign.  Neurologic: Protective sensation diminished bilateral.  Musculosketal: There is no pain to palpation to ulcerated area at right great toe bunion mild pain to right fifth toe.  There is  pain to palpation to the right lateral ankle and limitation of range of motion at the right ankle.  No results for input(s): GRAMSTAIN, LABORGA in the last 8760 hours.  Assessment and Plan:  Problem List Items Addressed This Visit   None Visit Diagnoses     Other acute osteomyelitis of right foot ([place])    -  Primary   Toe ulcer, right, with necrosis of bone ([place])       Toe ulcer, right, limited to breakdown of skin ([place])       Diabetic polyneuropathy associated with type 2 diabetes mellitus ([place])           -Examined patient and discussed the progression of the wound and treatment alternatives. - Excisionally dedbrided ulceration at right great toe to healthy bleeding borders removing nonviable tissue using a sterile chisel blade. Wound measures post debridement as above, wound was debrided to the level of the dermis with viable wound base exposed to promote healing. Hemostasis was achieved  with manuel pressure. Patient tolerated procedure well without any discomfort or anesthesia necessary for this wound debridement.  -Applied Iodosorb to the right great toe and right fifth toe and instructed patient to continue with daily dressings at home consisting of same daily like before -Advised patient if redness is extending and getting worse even though I have resumed him back on [place] he should go to the hospital for IV antibiotics or if pain or symptoms worsen in the [place] to the hospital for an ultrasound of the calf educated patient on symptoms and signs of DVT however at this time is low concern -Advised patient to resume using surgical shoe that he  has at home however patient still comes to clinic in a normal shoe; educated patient again on the importance of wearing the surgical shoe -Patient to return to office 7 days for follow-up or sooner if problems or issues arise.  Advised patient that we may not be able to wait until October for surgery he may need to have surgery sooner if things continue to worsen. Landis Martins, DPM

## 2020-10-10 ENCOUNTER — Telehealth: Payer: Self-pay | Admitting: Sports Medicine

## 2020-10-10 NOTE — Telephone Encounter (Signed)
Pt will be given 3 day sample pack from office & then should receive samples in mail.  Pharmacy is waiting on approval for pt assistance due to high ded.  Pt notified and understands.

## 2020-10-10 NOTE — Telephone Encounter (Signed)
Timothy Roche Elesa Hacker [phone]  No answer/vm full-unable to lv msg

## 2020-10-10 NOTE — Telephone Encounter (Signed)
Pt states has not recvd antibiotic in mail yet.  Took the 2 days he was given but says infection is coming back.  Pls advise

## 2020-10-10 NOTE — Telephone Encounter (Signed)
Spoke with Robert Santiago & he is following up & will call me back with update.

## 2020-10-11 DIAGNOSIS — Z23 Encounter for immunization: Secondary | ICD-10-CM | POA: Diagnosis not present

## 2020-10-11 DIAGNOSIS — E785 Hyperlipidemia, unspecified: Secondary | ICD-10-CM | POA: Diagnosis not present

## 2020-10-11 DIAGNOSIS — I2510 Atherosclerotic heart disease of native coronary artery without angina pectoris: Secondary | ICD-10-CM | POA: Diagnosis not present

## 2020-10-11 DIAGNOSIS — E1169 Type 2 diabetes mellitus with other specified complication: Secondary | ICD-10-CM | POA: Diagnosis not present

## 2020-10-11 DIAGNOSIS — I10 Essential (primary) hypertension: Secondary | ICD-10-CM | POA: Diagnosis not present

## 2020-10-11 DIAGNOSIS — K219 Gastro-esophageal reflux disease without esophagitis: Secondary | ICD-10-CM | POA: Diagnosis not present

## 2020-10-11 DIAGNOSIS — J309 Allergic rhinitis, unspecified: Secondary | ICD-10-CM | POA: Diagnosis not present

## 2020-10-11 DIAGNOSIS — E039 Hypothyroidism, unspecified: Secondary | ICD-10-CM | POA: Diagnosis not present

## 2020-10-11 DIAGNOSIS — M869 Osteomyelitis, unspecified: Secondary | ICD-10-CM | POA: Diagnosis not present

## 2020-10-11 NOTE — Progress Notes (Signed)
Cardiology Office Note:    Date:  10/12/2020   ID:  [name], DOB [birth year], MRN [id number]  PCP:  [name], MD  Cardiologist:  [name], MD    Referring MD: [name], MD    ASSESSMENT:    1. Coronary artery disease of native artery of native heart with stable angina pectoris ([place])   2. Essential hypertension   3. Mixed hyperlipidemia   4. Type 2 diabetes mellitus without complication, without long-term current use of insulin (HCC)    PLAN:    In order of problems listed above:  Stable CAD having no anginal discomfort he is optimized for his planned minor surgical intervention as an outpatient continue long-term medical therapy including his dual antiplatelet beta-blocker and lipid-lowering with combined rosuvastatin and Zetia lipids are at target. Stable controlled continue current treatment Lipids are at target continue combined statin and Zetia Improved A1c now at target   Next appointment: 9 months   Medication Adjustments/Labs and Tests Ordered: Current medicines are reviewed at length with the patient today.  Concerns regarding medicines are outlined above.  No orders of the defined types were placed in this encounter.  No orders of the defined types were placed in this encounter.   Chief Complaint  Patient presents with   Follow-up   Coronary Artery Disease    History of Present Illness:    Robert Santiago is a 70 y.o. male with a hx of coronary artery disease coronary artery bypass surgery 08/23/2014 with a left thoracic artery anastomosed to the left anterior descending vein graft to the first diagonal vein graft to the ramus and vein graft to an obtuse marginal and vein graft to the right sided posterior lateral branch.  Other problems include hypertension hyperlipidemia and superficial thrombophlebitis of lower extremity.  He seen 04/04/2020.  He was having typical exertional anginal equivalent dyspnea and underwent myocardial perfusion study for  risk stratification.  The study was reported as abnormal and with shared decision making he was referred for coronary angiogram coronary angiography performed 02/08/18 and 2 of his 5 grafts to be occluded with patent left thoracic artery to the LAD and a vein graft to the diagonal and marginal branch. The ejection fraction of the range of 45% with lateral hypokinesia he had severe native coronary artery disease with subtotal occlusion of left main left anterior descending ramus and left circumflex trifurcation. He was felt to be best treated medically.   Compliance with diet, lifestyle and medications: Yes Is now seeing Ovid Curd, [place] Notes his PCP Overall pleased with quality of life most recent A1c 7%.  Other labs 0 06/23/2020 cholesterol 111 HDL 37 LDL 59 triglycerides 70. He has no angina palpitations syncope he has mild exertional shortness of breath. His problem is osteomyelitis of the right great toe Surgical intervention a week from now was given instructions for holding his clopidogrel. He tells me he had an EKG done in the podiatry office we will request a copy  Past Medical History:  Diagnosis Date   Acquired hypothyroidism 03/19/2019   Atherosclerosis of autologous vein coronary artery bypass graft(s) with unstable angina pectoris ([place]) 03/19/2019   Coronary artery disease involving native coronary artery of native heart with angina pectoris ([place]) 03/19/2019   Cath 08/20/14: Conclusions Diagnostic Procedure Summary 1. Severe, diffuse multi-vessel CAD 2. Normal LV function Diagnostic Procedure Recommendations CTS consult Angiographic findings Cardiac Arteries and Lesion Findings LMCA: Normal. LAD: Lesion on Prox LAD: 80% stenosis. Lesion on Dist LAD:  80% stenosis. LCx: Lesion on Prox CX: Proximal subsection.80% stenosis. Lesion on 1st Ob Marg: 80% stenos   Coronary artery disease of native artery of native heart with stable angina pectoris ([place])    COVID-19 08/2018   Diabetes mellitus without  complication ([place])    Encounter for screening for diabetes mellitus 03/19/2019   IMOUPDATE   Essential hypertension    Hx of CABG 08/23/2014   (Dr. Darryl Nestle) CABG x5: LIMA-LAD, SVG-D1, SVG-RI, SVG-OM 1, SVG-RPL.   Hyperlipidemia    Multiple vessel coronary artery disease 08/21/2014   Cath ([place]): Right dominant -RCA: Proximal 95, mid 95, distal 95.  LM-normal.  LAD: Proximal 80%, distal 80%; LCx: Proximal 80%, OM1 80%. -Normal EF. -->  Referred for CABG   Progressive angina (HCC)-class III 12/19/2018   Status post cardiac catheterization 03/19/2019   Type 2 diabetes mellitus without complication ([place]) 03/18/1960    Past Surgical History:  Procedure Laterality Date   ACOUSTIC NEUROMA RESECTION     APPENDECTOMY     CARDIAC CATHETERIZATION  08/21/2014   Right dominant -RCA: Proximal 95, mid 95, distal 95.  LM-normal.  LAD: Proximal 80%, distal 80%; LCx: Proximal 80%, OM1 80%. -Normal EF. -->  Referred for CABG   CORONARY ARTERY BYPASS GRAFT  08/23/2014   (Dr. Darryl Nestle) CABG x5: LIMA-LAD, SVG-D1, SVG-RI, SVG-OM 1, SVG-RPL.   LEFT HEART CATH AND CORS/GRAFTS ANGIOGRAPHY N/A 02/09/2019   Procedure: LEFT HEART CATH AND CORS/GRAFTS ANGIOGRAPHY;  Surgeon: Leonie Man, MD;  Location: [facility];  Service: Cardiovascular;  Laterality: N/A;   TRANSTHORACIC ECHOCARDIOGRAM  08/2014   ([place]) mild LVH, EF 55% with no R WMA.  Normal valves.    Current Medications: Current Meds  Medication Sig   acetaminophen (TYLENOL) 500 MG tablet Take 1,000 mg by mouth every 6 (six) hours as needed for moderate pain or headache.   ALPRAZolam (XANAX) 0.25 MG tablet Take 0.25 mg by mouth daily as needed for anxiety.    aspirin EC 81 MG tablet Take 81 mg by mouth daily.   benazepril (LOTENSIN) 20 MG tablet Take 20 mg by mouth daily.   citalopram (CELEXA) 40 MG tablet Take 40 mg by mouth daily.   clopidogrel (PLAVIX) 75 MG tablet Take 1 tablet (75 mg total) by mouth daily.    clotrimazole-betamethasone (LOTRISONE) cream Apply 1 application topically daily as needed for rash.   ezetimibe (ZETIA) 10 MG tablet Take 10 mg by mouth daily.   gabapentin (NEURONTIN) 300 MG capsule Take 1 capsule (300 mg total) by mouth 2 (two) times daily.   glimepiride (AMARYL) 4 MG tablet Take 4 mg by mouth 2 (two) times daily.    JANUVIA 100 MG tablet Take 100 mg by mouth daily.   LANTUS SOLOSTAR 100 UNIT/ML Solostar Pen Inject 25 Units into the skin daily.   levothyroxine (SYNTHROID) 175 MCG tablet Take 175 mcg by mouth daily.    meloxicam (MOBIC) 15 MG tablet Take 15 mg by mouth daily.   metFORMIN (GLUCOPHAGE) 850 MG tablet Take 850 mg by mouth 2 (two) times daily.    metoprolol tartrate (LOPRESSOR) 25 MG tablet Take 25 mg by mouth 2 (two) times daily.    nitroGLYCERIN (NITROSTAT) 0.4 MG SL tablet Place 0.4 mg under the tongue every 5 (five) minutes as needed for chest pain.   omeprazole (PRILOSEC) 40 MG capsule Take 40 mg by mouth daily.   rosuvastatin (CRESTOR) 10 MG tablet Take 10 mg by mouth daily.  Tetrahydrozoline HCl (VISINE OP) Place 1 drop into both eyes at bedtime as needed (irritation).     Allergies:   Patient has no known allergies.   Social History   Socioeconomic History   Marital status: Married    Spouse name: Not on file   Number of children: Not on file   Years of education: Not on file   Highest education level: Not on file  Occupational History   Not on file  Tobacco Use   Smoking status: Former    Packs/day: 1.00    Years: 30.00    Pack years: 30.00    Types: Cigarettes    Quit date: 2003    Years since quitting: 19.7   Smokeless tobacco: Never  Vaping Use   Vaping Use: Never used  Substance and Sexual Activity   Alcohol use: Yes    Comment: occ   Drug use: Never   Sexual activity: Not on file  Other Topics Concern   Not on file  Social History Narrative   Not on file   Social Determinants of Health   Financial Resource Strain: Not on  file  Food Insecurity: Not on file  Transportation Needs: Not on file  Physical Activity: Not on file  Stress: Not on file  Social Connections: Not on file     Family History: The patient's family history includes COPD in his mother; Heart attack in his brother; Heart disease in his brother, brother, brother, and father; Heart failure in his mother; Hyperlipidemia in his father and mother; Hypertension in his father and mother. ROS:   Please see the history of present illness.    All other systems reviewed and are negative.  EKGs/Labs/Other Studies Reviewed:    The following studies were reviewed today:    Physical Exam:    VS:  BP 132/72   Pulse (!) 58   Ht 6\' 1"  (1.854 m)   Wt 292 lb 6.4 oz (132.6 kg)   SpO2 98%   BMI 38.58 kg/m     Wt Readings from Last 3 Encounters:  10/12/20 292 lb 6.4 oz (132.6 kg)  04/04/20 292 lb 12.8 oz (132.8 kg)  10/06/19 290 lb 12.8 oz (131.9 kg)     GEN:  Well nourished, well developed in no acute distress HEENT: Normal NECK: No JVD; No carotid bruits LYMPHATICS: No lymphadenopathy CARDIAC: RRR, no murmurs, rubs, gallops RESPIRATORY:  Clear to auscultation without rales, wheezing or rhonchi  ABDOMEN: Soft, non-tender, non-distended MUSCULOSKELETAL:  No edema; No deformity  SKIN: Warm and dry NEUROLOGIC:  Alert and oriented x 3 PSYCHIATRIC:  Normal affect    Signed, [name], MD  10/12/2020 8:48 AM    [place]

## 2020-10-12 ENCOUNTER — Ambulatory Visit: Payer: Medicare HMO | Admitting: Cardiology

## 2020-10-12 ENCOUNTER — Encounter: Payer: Self-pay | Admitting: Cardiology

## 2020-10-12 ENCOUNTER — Other Ambulatory Visit: Payer: Self-pay

## 2020-10-12 VITALS — BP 132/72 | HR 58 | Ht 73.0 in | Wt 292.4 lb

## 2020-10-12 DIAGNOSIS — I25118 Atherosclerotic heart disease of native coronary artery with other forms of angina pectoris: Secondary | ICD-10-CM | POA: Diagnosis not present

## 2020-10-12 DIAGNOSIS — E119 Type 2 diabetes mellitus without complications: Secondary | ICD-10-CM

## 2020-10-12 DIAGNOSIS — Z01818 Encounter for other preprocedural examination: Secondary | ICD-10-CM | POA: Diagnosis not present

## 2020-10-12 DIAGNOSIS — Z01810 Encounter for preprocedural cardiovascular examination: Secondary | ICD-10-CM | POA: Diagnosis not present

## 2020-10-12 DIAGNOSIS — I10 Essential (primary) hypertension: Secondary | ICD-10-CM | POA: Diagnosis not present

## 2020-10-12 DIAGNOSIS — E782 Mixed hyperlipidemia: Secondary | ICD-10-CM | POA: Diagnosis not present

## 2020-10-12 NOTE — Addendum Note (Signed)
Addended by: Truddie Hidden on: 10/12/2020 09:04 AM   Modules accepted: Orders

## 2020-10-12 NOTE — Patient Instructions (Signed)

## 2020-10-14 ENCOUNTER — Ambulatory Visit: Payer: Medicare HMO | Admitting: Sports Medicine

## 2020-10-17 ENCOUNTER — Telehealth: Payer: Self-pay

## 2020-10-17 DIAGNOSIS — M6289 Other specified disorders of muscle: Secondary | ICD-10-CM | POA: Diagnosis not present

## 2020-10-17 DIAGNOSIS — E11621 Type 2 diabetes mellitus with foot ulcer: Secondary | ICD-10-CM | POA: Diagnosis not present

## 2020-10-17 DIAGNOSIS — L97519 Non-pressure chronic ulcer of other part of right foot with unspecified severity: Secondary | ICD-10-CM | POA: Diagnosis not present

## 2020-10-17 DIAGNOSIS — R5381 Other malaise: Secondary | ICD-10-CM | POA: Diagnosis not present

## 2020-10-17 DIAGNOSIS — M19071 Primary osteoarthritis, right ankle and foot: Secondary | ICD-10-CM | POA: Diagnosis not present

## 2020-10-17 DIAGNOSIS — S92231A Displaced fracture of intermediate cuneiform of right foot, initial encounter for closed fracture: Secondary | ICD-10-CM | POA: Diagnosis not present

## 2020-10-17 DIAGNOSIS — L97512 Non-pressure chronic ulcer of other part of right foot with fat layer exposed: Secondary | ICD-10-CM | POA: Diagnosis not present

## 2020-10-17 DIAGNOSIS — R002 Palpitations: Secondary | ICD-10-CM | POA: Diagnosis not present

## 2020-10-17 NOTE — Telephone Encounter (Signed)
Called pt back and informed him of Dr. Leeanne Rio instructions. Pt states he will let us know what he decides to do

## 2020-10-17 NOTE — Telephone Encounter (Signed)
Pt called and LVM stating his toe is looking really bad, pt states ulcer is getting bigger and spreading more. Pt states he has an appointment on Friday with Dr. Cannon Kettle and his surgery is scheduled for Monday and would like to know if he should be seen sooner or what Does Dr. Cannon Kettle recommends for him ?

## 2020-10-18 ENCOUNTER — Encounter: Payer: Self-pay | Admitting: Sports Medicine

## 2020-10-18 DIAGNOSIS — M2041 Other hammer toe(s) (acquired), right foot: Secondary | ICD-10-CM | POA: Diagnosis not present

## 2020-10-18 DIAGNOSIS — R5381 Other malaise: Secondary | ICD-10-CM | POA: Diagnosis not present

## 2020-10-18 DIAGNOSIS — S92231A Displaced fracture of intermediate cuneiform of right foot, initial encounter for closed fracture: Secondary | ICD-10-CM | POA: Diagnosis not present

## 2020-10-18 DIAGNOSIS — E1169 Type 2 diabetes mellitus with other specified complication: Secondary | ICD-10-CM | POA: Diagnosis not present

## 2020-10-18 DIAGNOSIS — L97519 Non-pressure chronic ulcer of other part of right foot with unspecified severity: Secondary | ICD-10-CM | POA: Diagnosis not present

## 2020-10-18 DIAGNOSIS — L03115 Cellulitis of right lower limb: Secondary | ICD-10-CM | POA: Diagnosis not present

## 2020-10-18 DIAGNOSIS — M86171 Other acute osteomyelitis, right ankle and foot: Secondary | ICD-10-CM | POA: Diagnosis not present

## 2020-10-18 DIAGNOSIS — M19071 Primary osteoarthritis, right ankle and foot: Secondary | ICD-10-CM | POA: Diagnosis not present

## 2020-10-18 DIAGNOSIS — M869 Osteomyelitis, unspecified: Secondary | ICD-10-CM | POA: Diagnosis not present

## 2020-10-18 DIAGNOSIS — M6289 Other specified disorders of muscle: Secondary | ICD-10-CM | POA: Diagnosis not present

## 2020-10-18 DIAGNOSIS — F419 Anxiety disorder, unspecified: Secondary | ICD-10-CM | POA: Diagnosis not present

## 2020-10-18 DIAGNOSIS — I2510 Atherosclerotic heart disease of native coronary artery without angina pectoris: Secondary | ICD-10-CM | POA: Diagnosis not present

## 2020-10-18 DIAGNOSIS — L97512 Non-pressure chronic ulcer of other part of right foot with fat layer exposed: Secondary | ICD-10-CM | POA: Diagnosis not present

## 2020-10-18 DIAGNOSIS — E11621 Type 2 diabetes mellitus with foot ulcer: Secondary | ICD-10-CM | POA: Diagnosis not present

## 2020-10-18 DIAGNOSIS — A480 Gas gangrene: Secondary | ICD-10-CM | POA: Diagnosis not present

## 2020-10-18 DIAGNOSIS — M7989 Other specified soft tissue disorders: Secondary | ICD-10-CM | POA: Diagnosis not present

## 2020-10-18 DIAGNOSIS — I10 Essential (primary) hypertension: Secondary | ICD-10-CM | POA: Diagnosis not present

## 2020-10-18 DIAGNOSIS — E1152 Type 2 diabetes mellitus with diabetic peripheral angiopathy with gangrene: Secondary | ICD-10-CM | POA: Diagnosis not present

## 2020-10-18 DIAGNOSIS — L97518 Non-pressure chronic ulcer of other part of right foot with other specified severity: Secondary | ICD-10-CM | POA: Diagnosis not present

## 2020-10-18 DIAGNOSIS — R002 Palpitations: Secondary | ICD-10-CM | POA: Diagnosis not present

## 2020-10-19 DIAGNOSIS — L03115 Cellulitis of right lower limb: Secondary | ICD-10-CM | POA: Diagnosis not present

## 2020-10-19 DIAGNOSIS — E1169 Type 2 diabetes mellitus with other specified complication: Secondary | ICD-10-CM | POA: Diagnosis not present

## 2020-10-19 DIAGNOSIS — M86171 Other acute osteomyelitis, right ankle and foot: Secondary | ICD-10-CM | POA: Diagnosis not present

## 2020-10-20 DIAGNOSIS — L03115 Cellulitis of right lower limb: Secondary | ICD-10-CM | POA: Diagnosis not present

## 2020-10-20 DIAGNOSIS — M86171 Other acute osteomyelitis, right ankle and foot: Secondary | ICD-10-CM | POA: Diagnosis not present

## 2020-10-20 DIAGNOSIS — E1169 Type 2 diabetes mellitus with other specified complication: Secondary | ICD-10-CM | POA: Diagnosis not present

## 2020-10-21 ENCOUNTER — Ambulatory Visit: Payer: Medicare HMO | Admitting: Sports Medicine

## 2020-10-25 ENCOUNTER — Ambulatory Visit (INDEPENDENT_AMBULATORY_CARE_PROVIDER_SITE_OTHER): Payer: Medicare HMO | Admitting: Sports Medicine

## 2020-10-25 ENCOUNTER — Other Ambulatory Visit: Payer: Self-pay

## 2020-10-25 ENCOUNTER — Ambulatory Visit (INDEPENDENT_AMBULATORY_CARE_PROVIDER_SITE_OTHER): Payer: Medicare HMO

## 2020-10-25 ENCOUNTER — Encounter: Payer: Self-pay | Admitting: Sports Medicine

## 2020-10-25 DIAGNOSIS — M86171 Other acute osteomyelitis, right ankle and foot: Secondary | ICD-10-CM

## 2020-10-25 DIAGNOSIS — Z9889 Other specified postprocedural states: Secondary | ICD-10-CM

## 2020-10-25 DIAGNOSIS — L03031 Cellulitis of right toe: Secondary | ICD-10-CM

## 2020-10-25 DIAGNOSIS — E1142 Type 2 diabetes mellitus with diabetic polyneuropathy: Secondary | ICD-10-CM

## 2020-10-25 DIAGNOSIS — L02611 Cutaneous abscess of right foot: Secondary | ICD-10-CM

## 2020-10-25 NOTE — Progress Notes (Signed)
Subjective: Robert Santiago is a 70 y.o. male patient seen today in office for POV #1 (DOS 10/18/2020), S/P right great toe amputation and removal of bone right fifth toe.  Patient denies pain at surgical site, denies calf pain, denies headache, chest pain, shortness of breath, nausea, vomiting, fever, or chills. Patient states that he is doing good and is assisted by wife this visit.  Patient was discharged from [facility] on 10/20/2020.  Patient Active Problem List   Diagnosis Date Noted   Diabetes mellitus without complication ([place])    Acquired hypothyroidism 03/19/2019   Atherosclerosis of autologous vein coronary artery bypass graft(s) with unstable angina pectoris ([place]) 03/19/2019   Encounter for screening for diabetes mellitus 03/19/2019   Status post cardiac catheterization 03/19/2019   Type 2 diabetes mellitus without complication ([place]) 53/29/9242   Coronary artery disease involving native coronary artery of native heart with angina pectoris ([place]) 03/19/2019   Coronary artery disease of native artery of native heart with stable angina pectoris ([place])    Hyperlipidemia    Essential hypertension    Progressive angina (HCC)-class III 12/19/2018   COVID-19 08/2018   Hx of CABG 08/23/2014   Multiple vessel coronary artery disease 08/21/2014    Current Outpatient Medications on File Prior to Visit  Medication Sig Dispense Refill   acetaminophen (TYLENOL) 500 MG tablet Take 1,000 mg by mouth every 6 (six) hours as needed for moderate pain or headache.     ALPRAZolam (XANAX) 0.25 MG tablet Take 0.25 mg by mouth daily as needed for anxiety.      aspirin EC 81 MG tablet Take 81 mg by mouth daily.     benazepril (LOTENSIN) 20 MG tablet Take 20 mg by mouth daily.     citalopram (CELEXA) 40 MG tablet Take 40 mg by mouth daily.     clopidogrel (PLAVIX) 75 MG tablet Take 1 tablet (75 mg total) by mouth daily. 90 tablet 3   clotrimazole-betamethasone (LOTRISONE) cream Apply 1 application  topically daily as needed for rash.     ezetimibe (ZETIA) 10 MG tablet Take 10 mg by mouth daily.     gabapentin (NEURONTIN) 300 MG capsule Take 1 capsule (300 mg total) by mouth 2 (two) times daily. 90 capsule 3   glimepiride (AMARYL) 4 MG tablet Take 4 mg by mouth 2 (two) times daily.      JANUVIA 100 MG tablet Take 100 mg by mouth daily.     LANTUS SOLOSTAR 100 UNIT/ML Solostar Pen Inject 25 Units into the skin daily.     levothyroxine (SYNTHROID) 175 MCG tablet Take 175 mcg by mouth daily.      meloxicam (MOBIC) 15 MG tablet Take 15 mg by mouth daily.     metFORMIN (GLUCOPHAGE) 850 MG tablet Take 850 mg by mouth 2 (two) times daily.      metoprolol tartrate (LOPRESSOR) 25 MG tablet Take 25 mg by mouth 2 (two) times daily.      nitroGLYCERIN (NITROSTAT) 0.4 MG SL tablet Place 0.4 mg under the tongue every 5 (five) minutes as needed for chest pain.     omeprazole (PRILOSEC) 40 MG capsule Take 40 mg by mouth daily.     rosuvastatin (CRESTOR) 10 MG tablet Take 10 mg by mouth daily.     Tetrahydrozoline HCl (VISINE OP) Place 1 drop into both eyes at bedtime as needed (irritation).     No current facility-administered medications on file prior to visit.    No Known Allergies  Objective: There were no vitals filed for this visit.  General: No acute distress, AAOx3  Right foot: Sutures and staples intact with no gapping or dehiscence at surgical site, mild swelling to right foot, minimal erythema, no warmth, minimal bloody drainage, no other signs of infection noted, Capillary fill time <3 seconds in all remaining digits, status post right great toe amputation.  No pain with calf compression.  Unchanged pain to lateral ankle.  Post Op Xray, Right foot status post right hallux and distal first metatarsal amputation and removal of bone fifth toe, soft tissue swelling within normal limits for post op status.   Assessment and Plan:  Problem List Items Addressed This Visit   None Visit  Diagnoses     S/P foot surgery, right    -  Primary   Relevant Orders   DG Foot Complete Right   Acute osteomyelitis of toe, right (HCC)       Relevant Orders   DG Foot Complete Right   Other acute osteomyelitis of right foot ([place])       Diabetic polyneuropathy associated with type 2 diabetes mellitus (HCC)       Cellulitis and abscess of toe of right foot            -Patient seen and evaluated -X-rays reviewed -Applied dry sterile dressing to surgical site right foot secured with Coban wrap and stockinet  -Advised patient to make sure to keep dressings clean, dry, and intact to right foot and to call office if he gets it wet or if there is bloody drainage or strikethrough-Advised patient to continue with post-op shoe on right foot and to limit activity -Advised patient to continue with rest ice and elevation as previously directed -Continue with antibiotics until completed -Will plan for possible suture removal at next office visit. In the meantime, patient to call office if any issues or problems arise.   Landis Martins, DPM

## 2020-11-01 ENCOUNTER — Ambulatory Visit (INDEPENDENT_AMBULATORY_CARE_PROVIDER_SITE_OTHER): Payer: Medicare HMO | Admitting: Sports Medicine

## 2020-11-01 ENCOUNTER — Encounter: Payer: Self-pay | Admitting: Sports Medicine

## 2020-11-01 DIAGNOSIS — Z9889 Other specified postprocedural states: Secondary | ICD-10-CM

## 2020-11-01 DIAGNOSIS — E1142 Type 2 diabetes mellitus with diabetic polyneuropathy: Secondary | ICD-10-CM

## 2020-11-01 DIAGNOSIS — M86171 Other acute osteomyelitis, right ankle and foot: Secondary | ICD-10-CM

## 2020-11-01 MED ORDER — CIPROFLOXACIN HCL 500 MG PO TABS: 500.0000 mg | ORAL_TABLET | Freq: Two times a day (BID) | ORAL | 0 refills | Status: AC

## 2020-11-01 NOTE — Progress Notes (Signed)
Subjective: Robert Santiago is a 70 y.o. male patient seen today in office for POV #2 (DOS 10/18/2020), S/P right great toe amputation and removal of bone right fifth toe.  Patient reports that he got his dressing wet try to change each but not sure if they did it properly.  Patient was discharged from [facility] on 10/20/2020.  Patient Active Problem List   Diagnosis Date Noted   Diabetes mellitus without complication ([place])    Acquired hypothyroidism 03/19/2019   Atherosclerosis of autologous vein coronary artery bypass graft(s) with unstable angina pectoris ([place]) 03/19/2019   Encounter for screening for diabetes mellitus 03/19/2019   Status post cardiac catheterization 03/19/2019   Type 2 diabetes mellitus without complication ([place]) 31/54/0086   Coronary artery disease involving native coronary artery of native heart with angina pectoris ([place]) 03/19/2019   Coronary artery disease of native artery of native heart with stable angina pectoris ([place])    Hyperlipidemia    Essential hypertension    Progressive angina (HCC)-class III 12/19/2018   COVID-19 08/2018   Hx of CABG 08/23/2014   Multiple vessel coronary artery disease 08/21/2014    Current Outpatient Medications on File Prior to Visit  Medication Sig Dispense Refill   acetaminophen (TYLENOL) 500 MG tablet Take 1,000 mg by mouth every 6 (six) hours as needed for moderate pain or headache.     ALPRAZolam (XANAX) 0.25 MG tablet Take 0.25 mg by mouth daily as needed for anxiety.      aspirin EC 81 MG tablet Take 81 mg by mouth daily.     benazepril (LOTENSIN) 20 MG tablet Take 20 mg by mouth daily.     citalopram (CELEXA) 40 MG tablet Take 40 mg by mouth daily.     clopidogrel (PLAVIX) 75 MG tablet Take 1 tablet (75 mg total) by mouth daily. 90 tablet 3   clotrimazole-betamethasone (LOTRISONE) cream Apply 1 application topically daily as needed for rash.     ezetimibe (ZETIA) 10 MG tablet Take 10 mg by mouth daily.      gabapentin (NEURONTIN) 300 MG capsule Take 1 capsule (300 mg total) by mouth 2 (two) times daily. 90 capsule 3   glimepiride (AMARYL) 4 MG tablet Take 4 mg by mouth 2 (two) times daily.      JANUVIA 100 MG tablet Take 100 mg by mouth daily.     LANTUS SOLOSTAR 100 UNIT/ML Solostar Pen Inject 25 Units into the skin daily.     levothyroxine (SYNTHROID) 175 MCG tablet Take 175 mcg by mouth daily.      meloxicam (MOBIC) 15 MG tablet Take 15 mg by mouth daily.     metFORMIN (GLUCOPHAGE) 850 MG tablet Take 850 mg by mouth 2 (two) times daily.      metoprolol tartrate (LOPRESSOR) 25 MG tablet Take 25 mg by mouth 2 (two) times daily.      nitroGLYCERIN (NITROSTAT) 0.4 MG SL tablet Place 0.4 mg under the tongue every 5 (five) minutes as needed for chest pain.     omeprazole (PRILOSEC) 40 MG capsule Take 40 mg by mouth daily.     rosuvastatin (CRESTOR) 10 MG tablet Take 10 mg by mouth daily.     Tetrahydrozoline HCl (VISINE OP) Place 1 drop into both eyes at bedtime as needed (irritation).     No current facility-administered medications on file prior to visit.    No Known Allergies  Objective: There were no vitals filed for this visit.  General: No acute distress, AAOx3  Right foot: Sutures and staples intact with no gapping or dehiscence at surgical site, ++Maceration, Green color to skin from moisture, mild swelling to right foot, minimal erythema, no warmth, minimal bloody drainage, no other signs of infection noted, Capillary fill time <3 seconds in all remaining digits, status post right great toe amputation.  No pain with calf compression.  Unchanged pain to lateral ankle.  Assessment and Plan:  Problem List Items Addressed This Visit   None Visit Diagnoses     S/P foot surgery, right    -  Primary   Acute osteomyelitis of toe, right (HCC)       Other acute osteomyelitis of right foot ([place])       Diabetic polyneuropathy associated with type 2 diabetes mellitus ([place])             -Patient seen and evaluated -Sutures removed at right fifth toe -Applied Betadine and dry sterile dressing to surgical site right foot secured with Coban wrap and stockinet  -Advised patient to make sure to keep dressings clean, dry, and intact to right foot and to call office if he gets it wet or if there is bloody drainage or strikethrough or to change as I have directed supplies provided to patient -Prescribe Cipro for patient to take for going change likely related to Pseudomonas from moisture along incision -Advised patient to continue with post-op shoe on right foot and to limit activity -Advised patient to continue with rest and elevation as previously directed -Will plan for possible suture removal at the amputation stump site at next office visit. In the meantime, patient to call office if any issues or problems arise.   Landis Martins, DPM

## 2020-11-08 ENCOUNTER — Other Ambulatory Visit: Payer: Self-pay

## 2020-11-08 ENCOUNTER — Ambulatory Visit (INDEPENDENT_AMBULATORY_CARE_PROVIDER_SITE_OTHER): Payer: Medicare HMO | Admitting: Sports Medicine

## 2020-11-08 ENCOUNTER — Encounter: Payer: Self-pay | Admitting: Sports Medicine

## 2020-11-08 DIAGNOSIS — S90426A Blister (nonthermal), unspecified lesser toe(s), initial encounter: Secondary | ICD-10-CM

## 2020-11-08 DIAGNOSIS — M86171 Other acute osteomyelitis, right ankle and foot: Secondary | ICD-10-CM

## 2020-11-08 DIAGNOSIS — Z9889 Other specified postprocedural states: Secondary | ICD-10-CM

## 2020-11-08 DIAGNOSIS — L02611 Cutaneous abscess of right foot: Secondary | ICD-10-CM

## 2020-11-08 DIAGNOSIS — E1142 Type 2 diabetes mellitus with diabetic polyneuropathy: Secondary | ICD-10-CM

## 2020-11-08 DIAGNOSIS — L03031 Cellulitis of right toe: Secondary | ICD-10-CM

## 2020-11-08 NOTE — Progress Notes (Signed)
Subjective: Robert Santiago is a 70 y.o. male patient seen today in office for POV #3 (DOS 10/18/2020), S/P right great toe amputation and removal of bone right fifth toe.  Patient reports that he got his dressing wet next day but changed the and reports that the surgical site is looking much better admits less redness swelling and has been taking his antibiotic of ciprofloxacin without complication.   Patient Active Problem List   Diagnosis Date Noted   Diabetes mellitus without complication ([place])    Acquired hypothyroidism 03/19/2019   Atherosclerosis of autologous vein coronary artery bypass graft(s) with unstable angina pectoris ([place]) 03/19/2019   Encounter for screening for diabetes mellitus 03/19/2019   Status post cardiac catheterization 03/19/2019   Type 2 diabetes mellitus without complication ([place]) 14/43/1540   Coronary artery disease involving native coronary artery of native heart with angina pectoris ([place]) 03/19/2019   Coronary artery disease of native artery of native heart with stable angina pectoris ([place])    Hyperlipidemia    Essential hypertension    Progressive angina (HCC)-class III 12/19/2018   COVID-19 08/2018   Hx of CABG 08/23/2014   Multiple vessel coronary artery disease 08/21/2014    Current Outpatient Medications on File Prior to Visit  Medication Sig Dispense Refill   acetaminophen (TYLENOL) 500 MG tablet Take 1,000 mg by mouth every 6 (six) hours as needed for moderate pain or headache.     ALPRAZolam (XANAX) 0.25 MG tablet Take 0.25 mg by mouth daily as needed for anxiety.      aspirin EC 81 MG tablet Take 81 mg by mouth daily.     benazepril (LOTENSIN) 20 MG tablet Take 20 mg by mouth daily.     ciprofloxacin (CIPRO) 500 MG tablet Take 1 tablet (500 mg total) by mouth 2 (two) times daily for 7 days. 14 tablet 0   citalopram (CELEXA) 40 MG tablet Take 40 mg by mouth daily.     clopidogrel (PLAVIX) 75 MG tablet Take 1 tablet (75 mg total) by mouth daily. 90  tablet 3   clotrimazole-betamethasone (LOTRISONE) cream Apply 1 application topically daily as needed for rash.     ezetimibe (ZETIA) 10 MG tablet Take 10 mg by mouth daily.     gabapentin (NEURONTIN) 300 MG capsule Take 1 capsule (300 mg total) by mouth 2 (two) times daily. 90 capsule 3   glimepiride (AMARYL) 4 MG tablet Take 4 mg by mouth 2 (two) times daily.      JANUVIA 100 MG tablet Take 100 mg by mouth daily.     LANTUS SOLOSTAR 100 UNIT/ML Solostar Pen Inject 25 Units into the skin daily.     levothyroxine (SYNTHROID) 175 MCG tablet Take 175 mcg by mouth daily.      meloxicam (MOBIC) 15 MG tablet Take 15 mg by mouth daily.     metFORMIN (GLUCOPHAGE) 850 MG tablet Take 850 mg by mouth 2 (two) times daily.      metoprolol tartrate (LOPRESSOR) 25 MG tablet Take 25 mg by mouth 2 (two) times daily.      nitroGLYCERIN (NITROSTAT) 0.4 MG SL tablet Place 0.4 mg under the tongue every 5 (five) minutes as needed for chest pain.     omeprazole (PRILOSEC) 40 MG capsule Take 40 mg by mouth daily.     rosuvastatin (CRESTOR) 10 MG tablet Take 10 mg by mouth daily.     Tetrahydrozoline HCl (VISINE OP) Place 1 drop into both eyes at bedtime as needed (irritation).  No current facility-administered medications on file prior to visit.    No Known Allergies  Objective: There were no vitals filed for this visit.  General: No acute distress, AAOx3  Right foot: Sutures and staples intact with no gapping or dehiscence at surgical site, decreased maceration, mild swelling to right foot, minimal erythema, no warmth, minimal bloody drainage, no other signs of infection noted, blood blister noted to the distal tuft of the right second toe with localized erythema, edema, partially detached nail, capillary fill time <3 seconds in all remaining digits, status post right great toe amputation.  No pain with calf compression.  Unchanged pain to lateral ankle.  Assessment and Plan:  Problem List Items Addressed  This Visit   None Visit Diagnoses     Acute osteomyelitis of toe, right ([place])    -  Primary   S/P foot surgery, right       Other acute osteomyelitis of right foot (HCC)       Blister of lesser toe       Diabetic polyneuropathy associated with type 2 diabetes mellitus (HCC)       Cellulitis and abscess of toe of right foot            -Patient seen and evaluated -Sutures removed at hallux amputation stump site -Mechanically debrided right second toenail using sterile nail nipper without incident -Applied Betadine and dry sterile dressing to surgical site and to the right second toe secured with [place]an wrap, Ace wrap, and stockinet  -Advised patient to make sure to keep dressings clean, dry, and intact to right foot if gets wet may change as directed supplies provided -Continue with ciprofloxacin until completed -Advised patient to continue with post-op shoe on right foot and to limit activity and to refrain from using a normal shoe because this is likely because of the new blister at the right second toe -Advised patient to continue with rest and elevation as previously directed -Will plan for possible finishing staple removal at the amputation stump site at next office visit. In the meantime, patient to call office if any issues or problems arise.   Landis Martins, DPM

## 2020-11-16 ENCOUNTER — Encounter: Payer: Self-pay | Admitting: Sports Medicine

## 2020-11-16 ENCOUNTER — Ambulatory Visit (INDEPENDENT_AMBULATORY_CARE_PROVIDER_SITE_OTHER): Payer: Medicare HMO | Admitting: Sports Medicine

## 2020-11-16 ENCOUNTER — Ambulatory Visit (INDEPENDENT_AMBULATORY_CARE_PROVIDER_SITE_OTHER): Payer: Medicare HMO

## 2020-11-16 ENCOUNTER — Other Ambulatory Visit: Payer: Self-pay

## 2020-11-16 ENCOUNTER — Telehealth: Payer: Self-pay

## 2020-11-16 DIAGNOSIS — L02611 Cutaneous abscess of right foot: Secondary | ICD-10-CM

## 2020-11-16 DIAGNOSIS — L97511 Non-pressure chronic ulcer of other part of right foot limited to breakdown of skin: Secondary | ICD-10-CM

## 2020-11-16 DIAGNOSIS — Z9889 Other specified postprocedural states: Secondary | ICD-10-CM

## 2020-11-16 DIAGNOSIS — L03031 Cellulitis of right toe: Secondary | ICD-10-CM

## 2020-11-16 DIAGNOSIS — M86171 Other acute osteomyelitis, right ankle and foot: Secondary | ICD-10-CM

## 2020-11-16 MED ORDER — CLINDAMYCIN HCL 300 MG PO CAPS: 300.0000 mg | ORAL_CAPSULE | Freq: Three times a day (TID) | ORAL | 0 refills | Status: DC

## 2020-11-16 MED ORDER — CIPROFLOXACIN HCL 500 MG PO TABS: 500.0000 mg | ORAL_TABLET | Freq: Two times a day (BID) | ORAL | 0 refills | Status: AC

## 2020-11-16 MED ORDER — PROBIOTIC 250 MG PO CAPS: 1.0000 | ORAL_CAPSULE | Freq: Two times a day (BID) | ORAL | 1 refills | Status: DC

## 2020-11-16 NOTE — Progress Notes (Signed)
Subjective: Robert Santiago is a 70 y.o. male patient seen today in office for POV #4 (DOS 10/18/2020), S/P right great toe amputation and removal of bone right fifth toe.  Patient reports that right 2nd toe is more swollen and red. Denis nausea, vomiting, fever, or chills.  Wife has been helping with dressing changes using betadine, last change was on yesterday.  Patient Active Problem List   Diagnosis Date Noted   Diabetes mellitus without complication ([place])    Acquired hypothyroidism 03/19/2019   Atherosclerosis of autologous vein coronary artery bypass graft(s) with unstable angina pectoris ([place]) 03/19/2019   Encounter for screening for diabetes mellitus 03/19/2019   Status post cardiac catheterization 03/19/2019   Type 2 diabetes mellitus without complication ([place]) 53/97/6734   Coronary artery disease involving native coronary artery of native heart with angina pectoris ([place]) 03/19/2019   Coronary artery disease of native artery of native heart with stable angina pectoris ([place])    Hyperlipidemia    Essential hypertension    Progressive angina (HCC)-class III 12/19/2018   COVID-19 08/2018   Hx of CABG 08/23/2014   Multiple vessel coronary artery disease 08/21/2014    Current Outpatient Medications on File Prior to Visit  Medication Sig Dispense Refill   acetaminophen (TYLENOL) 500 MG tablet Take 1,000 mg by mouth every 6 (six) hours as needed for moderate pain or headache.     ALPRAZolam (XANAX) 0.25 MG tablet Take 0.25 mg by mouth daily as needed for anxiety.      aspirin EC 81 MG tablet Take 81 mg by mouth daily.     benazepril (LOTENSIN) 20 MG tablet Take 20 mg by mouth daily.     citalopram (CELEXA) 40 MG tablet Take 40 mg by mouth daily.     clopidogrel (PLAVIX) 75 MG tablet Take 1 tablet (75 mg total) by mouth daily. 90 tablet 3   clotrimazole-betamethasone (LOTRISONE) cream Apply 1 application topically daily as needed for rash.     ezetimibe (ZETIA) 10 MG tablet Take 10 mg  by mouth daily.     gabapentin (NEURONTIN) 300 MG capsule Take 1 capsule (300 mg total) by mouth 2 (two) times daily. 90 capsule 3   glimepiride (AMARYL) 4 MG tablet Take 4 mg by mouth 2 (two) times daily.      JANUVIA 100 MG tablet Take 100 mg by mouth daily.     LANTUS SOLOSTAR 100 UNIT/ML Solostar Pen Inject 25 Units into the skin daily.     levothyroxine (SYNTHROID) 175 MCG tablet Take 175 mcg by mouth daily.      meloxicam (MOBIC) 15 MG tablet Take 15 mg by mouth daily.     metFORMIN (GLUCOPHAGE) 850 MG tablet Take 850 mg by mouth 2 (two) times daily.      metoprolol tartrate (LOPRESSOR) 25 MG tablet Take 25 mg by mouth 2 (two) times daily.      nitroGLYCERIN (NITROSTAT) 0.4 MG SL tablet Place 0.4 mg under the tongue every 5 (five) minutes as needed for chest pain.     omeprazole (PRILOSEC) 40 MG capsule Take 40 mg by mouth daily.     rosuvastatin (CRESTOR) 10 MG tablet Take 10 mg by mouth daily.     Tetrahydrozoline HCl (VISINE OP) Place 1 drop into both eyes at bedtime as needed (irritation).     No current facility-administered medications on file prior to visit.    No Known Allergies  Objective: There were no vitals filed for this visit.  General: No  acute distress, AAOx3  Right foot: Sutures and staples intact with no gapping or dehiscence at surgical site, decreased maceration, mild swelling to right foot, minimal erythema, no warmth, minimal bloody drainage, no other signs of infection noted, + full thickness ulcer at right 2nd toe distal tuft that measures 0.3x0.2x0.2 with increased edema, redness, warmth at the distal tuft of the right second toe, capillary fill time <3 seconds in all remaining digits, status post right great toe amputation.  No pain with calf compression.  Unchanged pain to lateral ankle.  Right foot xray consistent with post op status, no osteomyelitis at 2nd toe   Assessment and Plan:  Problem List Items Addressed This Visit   None Visit Diagnoses      Skin ulcer of toe of right foot, limited to breakdown of skin ([place])    -  Primary   Relevant Orders   DG Foot Complete Right   Acute osteomyelitis of toe, right (HCC)       Relevant Medications   clindamycin (CLEOCIN) 300 MG capsule   S/P foot surgery, right       Cellulitis and abscess of toe of right foot            -Patient seen and evaluated -Xrays reviewed -A few staples were removed at hallux amputation stump site - Excisionally dedbrided ulceration at right 2nd toe to healthy bleeding borders removing nonviable tissue using a sterile chisel blade. Wound measures post debridement as above, Wound was debrided to the level of the dermis with viable wound base exposed to promote healing. Hemostasis was achieved with manuel pressure. Patient tolerated procedure well without any discomfort or anesthesia necessary for this wound debridement.  -Applied betadine and prisma and dry sterile dressing and instructed patient to continue with daily dressings at home consisting of same daily; Wound care supplies ordered for home from [facility]  -Rx Clindamycin and Cipro for 10 days -Added offloading padding to right surgical shoe -Advised patient to limit activity  - Advised patient to go to the ER or return to office if the wound worsens or if constitutional symptoms are present. -Will plan for wound care and possibly finishing staple removal at the amputation stump site at next office visit. In the meantime, patient to call office if any issues or problems arise.   Landis Martins, DPM

## 2020-11-16 NOTE — Telephone Encounter (Signed)
Pt called and LVM stating he has a new lesion at his Rt second toe that started a few days ago. The toe has been swollen and very red. Pt states he has an apt next Tuesday but would like to be seen sooner. Please advice

## 2020-11-22 ENCOUNTER — Other Ambulatory Visit: Payer: Self-pay

## 2020-11-22 ENCOUNTER — Ambulatory Visit (INDEPENDENT_AMBULATORY_CARE_PROVIDER_SITE_OTHER): Payer: Medicare HMO | Admitting: Sports Medicine

## 2020-11-22 ENCOUNTER — Encounter: Payer: Self-pay | Admitting: Sports Medicine

## 2020-11-22 DIAGNOSIS — M86171 Other acute osteomyelitis, right ankle and foot: Secondary | ICD-10-CM | POA: Diagnosis not present

## 2020-11-22 DIAGNOSIS — L03031 Cellulitis of right toe: Secondary | ICD-10-CM

## 2020-11-22 DIAGNOSIS — L97511 Non-pressure chronic ulcer of other part of right foot limited to breakdown of skin: Secondary | ICD-10-CM

## 2020-11-22 DIAGNOSIS — L02611 Cutaneous abscess of right foot: Secondary | ICD-10-CM

## 2020-11-22 DIAGNOSIS — Z9889 Other specified postprocedural states: Secondary | ICD-10-CM

## 2020-11-22 NOTE — Progress Notes (Signed)
Subjective: Robert Santiago is a 70 y.o. male patient seen today in office for POV #5 (DOS 10/18/2020), S/P right great toe amputation and removal of bone right fifth toe.  Patient reports that thins are about the same. Redness comes and goes. No constitutional symptoms.  Wife has been helping with dressing changes as directed.   Patient Active Problem List   Diagnosis Date Noted   Diabetes mellitus without complication ([place])    Acquired hypothyroidism 03/19/2019   Atherosclerosis of autologous vein coronary artery bypass graft(s) with unstable angina pectoris ([place]) 03/19/2019   Encounter for screening for diabetes mellitus 03/19/2019   Status post cardiac catheterization 03/19/2019   Type 2 diabetes mellitus without complication ([place]) 67/61/9509   Coronary artery disease involving native coronary artery of native heart with angina pectoris ([place]) 03/19/2019   Coronary artery disease of native artery of native heart with stable angina pectoris ([place])    Hyperlipidemia    Essential hypertension    Progressive angina (HCC)-class III 12/19/2018   COVID-19 08/2018   Hx of CABG 08/23/2014   Multiple vessel coronary artery disease 08/21/2014    Current Outpatient Medications on File Prior to Visit  Medication Sig Dispense Refill   acetaminophen (TYLENOL) 500 MG tablet Take 1,000 mg by mouth every 6 (six) hours as needed for moderate pain or headache.     ALPRAZolam (XANAX) 0.25 MG tablet Take 0.25 mg by mouth daily as needed for anxiety.      aspirin EC 81 MG tablet Take 81 mg by mouth daily.     benazepril (LOTENSIN) 20 MG tablet Take 20 mg by mouth daily.     ciprofloxacin (CIPRO) 500 MG tablet Take 1 tablet (500 mg total) by mouth 2 (two) times daily for 10 days. 20 tablet 0   citalopram (CELEXA) 40 MG tablet Take 40 mg by mouth daily.     clindamycin (CLEOCIN) 300 MG capsule Take 1 capsule (300 mg total) by mouth 3 (three) times daily. 30 capsule 0   clopidogrel (PLAVIX) 75 MG tablet Take  1 tablet (75 mg total) by mouth daily. 90 tablet 3   clotrimazole-betamethasone (LOTRISONE) cream Apply 1 application topically daily as needed for rash.     ezetimibe (ZETIA) 10 MG tablet Take 10 mg by mouth daily.     gabapentin (NEURONTIN) 300 MG capsule Take 1 capsule (300 mg total) by mouth 2 (two) times daily. 90 capsule 3   glimepiride (AMARYL) 4 MG tablet Take 4 mg by mouth 2 (two) times daily.      JANUVIA 100 MG tablet Take 100 mg by mouth daily.     LANTUS SOLOSTAR 100 UNIT/ML Solostar Pen Inject 25 Units into the skin daily.     levothyroxine (SYNTHROID) 175 MCG tablet Take 175 mcg by mouth daily.      meloxicam (MOBIC) 15 MG tablet Take 15 mg by mouth daily.     metFORMIN (GLUCOPHAGE) 850 MG tablet Take 850 mg by mouth 2 (two) times daily.      metoprolol tartrate (LOPRESSOR) 25 MG tablet Take 25 mg by mouth 2 (two) times daily.      nitroGLYCERIN (NITROSTAT) 0.4 MG SL tablet Place 0.4 mg under the tongue every 5 (five) minutes as needed for chest pain.     omeprazole (PRILOSEC) 40 MG capsule Take 40 mg by mouth daily.     rosuvastatin (CRESTOR) 10 MG tablet Take 10 mg by mouth daily.     Saccharomyces boulardii (PROBIOTIC) 250 MG CAPS  Take 1 capsule by mouth 2 (two) times daily. 60 capsule 1   Tetrahydrozoline HCl (VISINE OP) Place 1 drop into both eyes at bedtime as needed (irritation).     No current facility-administered medications on file prior to visit.    No Known Allergies  Objective: There were no vitals filed for this visit.  General: No acute distress, AAOx3  Right foot: Sutures and staples intact with minimal gapping or dehiscence at surgical site, decreased maceration, mild swelling to right foot, minimal erythema, no warmth, minimal bloody drainage, no other signs of infection noted, + full thickness ulcer at right 2nd toe distal tuft that measures 0.3x0.2x0.2 with decreasing edema, reduced redness, decreased warmth at the distal tuft of the right second toe,  capillary fill time <3 seconds in all remaining digits, status post right great toe amputation.  No pain with calf compression.   Assessment and Plan:  Problem List Items Addressed This Visit   None Visit Diagnoses     Skin ulcer of toe of right foot, limited to breakdown of skin (HCC)    -  Primary   Acute osteomyelitis of toe, right (HCC)       S/P foot surgery, right       Cellulitis and abscess of toe of right foot            -Patient seen and evaluated -More staples were removed at hallux amputation stump site -Cleansed wounds at right 2nd and surgical amputation site on right -Wound culture obtained  -Applied betadine and prisma and dry sterile dressing and instructed patient to continue with daily dressings at home consisting of same daily with help from wife -Continue with Clindamycin and Cipro as prescribed until wound culture results available  -Continue with surgical shoe -Advised patient to limit activity  - Advised patient to go to the ER or return to office if the wound worsens or if constitutional symptoms are present. -Will plan for  xrays, wound care and finishing any remaining staple/suture removal at the amputation stump site at next office visit. In the meantime, patient to call office if any issues or problems arise.   Landis Martins, DPM

## 2020-11-25 ENCOUNTER — Other Ambulatory Visit: Payer: Self-pay | Admitting: Sports Medicine

## 2020-11-25 MED ORDER — TERBINAFINE HCL 250 MG PO TABS: 250.0000 mg | ORAL_TABLET | Freq: Every day | ORAL | 0 refills | Status: DC

## 2020-11-25 MED ORDER — LEVOFLOXACIN 500 MG PO TABS: 500.0000 mg | ORAL_TABLET | Freq: Every day | ORAL | 0 refills | Status: DC

## 2020-11-25 NOTE — Progress Notes (Signed)
Toe strepCulture results reviewed with patient which reveals Enterococcus coccus Pseudomonas aeruginosa's and coagulase negative Staphylococcus.  Sent Levaquin to cover the bacterial component and sent Lamisil to cover the fungal component that came back positive on his culture.  Explained to patient to pick up these 2 medicines and to discontinue the clindamycin and Cipro.  Patient expressed understanding. -Dr. Cannon Kettle

## 2020-12-02 ENCOUNTER — Ambulatory Visit (INDEPENDENT_AMBULATORY_CARE_PROVIDER_SITE_OTHER): Payer: Medicare HMO | Admitting: Sports Medicine

## 2020-12-02 DIAGNOSIS — L03031 Cellulitis of right toe: Secondary | ICD-10-CM

## 2020-12-02 DIAGNOSIS — M86171 Other acute osteomyelitis, right ankle and foot: Secondary | ICD-10-CM

## 2020-12-02 DIAGNOSIS — L97511 Non-pressure chronic ulcer of other part of right foot limited to breakdown of skin: Secondary | ICD-10-CM

## 2020-12-02 DIAGNOSIS — Z9889 Other specified postprocedural states: Secondary | ICD-10-CM

## 2020-12-02 DIAGNOSIS — L02611 Cutaneous abscess of right foot: Secondary | ICD-10-CM

## 2020-12-02 MED ORDER — LEVOFLOXACIN 500 MG PO TABS: 500.0000 mg | ORAL_TABLET | Freq: Every day | ORAL | 0 refills | Status: DC

## 2020-12-02 NOTE — Progress Notes (Signed)
Subjective: Robert Santiago is a 70 y.o. male patient seen today in office for POV # 6 (DOS 10/18/2020), S/P right great toe amputation and removal of bone right fifth toe.  Patient reports that things are getting better less redness and swelling.  Has wife doing dressing changes.  No constitutional symptoms.  Patient Active Problem List   Diagnosis Date Noted   Diabetes mellitus without complication ([place])    Acquired hypothyroidism 03/19/2019   Atherosclerosis of autologous vein coronary artery bypass graft(s) with unstable angina pectoris ([place]) 03/19/2019   Encounter for screening for diabetes mellitus 03/19/2019   Status post cardiac catheterization 03/19/2019   Type 2 diabetes mellitus without complication ([place]) 93/71/6967   Coronary artery disease involving native coronary artery of native heart with angina pectoris ([place]) 03/19/2019   Coronary artery disease of native artery of native heart with stable angina pectoris ([place])    Hyperlipidemia    Essential hypertension    Progressive angina (HCC)-class III 12/19/2018   COVID-19 08/2018   Hx of CABG 08/23/2014   Multiple vessel coronary artery disease 08/21/2014    Current Outpatient Medications on File Prior to Visit  Medication Sig Dispense Refill   acetaminophen (TYLENOL) 500 MG tablet Take 1,000 mg by mouth every 6 (six) hours as needed for moderate pain or headache.     ALPRAZolam (XANAX) 0.25 MG tablet Take 0.25 mg by mouth daily as needed for anxiety.      aspirin EC 81 MG tablet Take 81 mg by mouth daily.     benazepril (LOTENSIN) 20 MG tablet Take 20 mg by mouth daily.     citalopram (CELEXA) 40 MG tablet Take 40 mg by mouth daily.     clindamycin (CLEOCIN) 300 MG capsule Take 1 capsule (300 mg total) by mouth 3 (three) times daily. 30 capsule 0   clopidogrel (PLAVIX) 75 MG tablet Take 1 tablet (75 mg total) by mouth daily. 90 tablet 3   clotrimazole-betamethasone (LOTRISONE) cream Apply 1 application topically daily as needed  for rash.     ezetimibe (ZETIA) 10 MG tablet Take 10 mg by mouth daily.     gabapentin (NEURONTIN) 300 MG capsule Take 1 capsule (300 mg total) by mouth 2 (two) times daily. 90 capsule 3   glimepiride (AMARYL) 4 MG tablet Take 4 mg by mouth 2 (two) times daily.      JANUVIA 100 MG tablet Take 100 mg by mouth daily.     LANTUS SOLOSTAR 100 UNIT/ML Solostar Pen Inject 25 Units into the skin daily.     levothyroxine (SYNTHROID) 175 MCG tablet Take 175 mcg by mouth daily.      meloxicam (MOBIC) 15 MG tablet Take 15 mg by mouth daily.     metFORMIN (GLUCOPHAGE) 850 MG tablet Take 850 mg by mouth 2 (two) times daily.      metoprolol tartrate (LOPRESSOR) 25 MG tablet Take 25 mg by mouth 2 (two) times daily.      nitroGLYCERIN (NITROSTAT) 0.4 MG SL tablet Place 0.4 mg under the tongue every 5 (five) minutes as needed for chest pain.     omeprazole (PRILOSEC) 40 MG capsule Take 40 mg by mouth daily.     rosuvastatin (CRESTOR) 10 MG tablet Take 10 mg by mouth daily.     Saccharomyces boulardii (PROBIOTIC) 250 MG CAPS Take 1 capsule by mouth 2 (two) times daily. 60 capsule 1   terbinafine (LAMISIL) 250 MG tablet Take 1 tablet (250 mg total) by mouth daily. [place]  tablet 0   Tetrahydrozoline HCl (VISINE OP) Place 1 drop into both eyes at bedtime as needed (irritation).     No current facility-administered medications on file prior to visit.    No Known Allergies  Objective: There were no vitals filed for this visit.  General: No acute distress, AAOx3  Right foot: Sutures and staples intact with minimal gapping or dehiscence at surgical site, decreased maceration, mild swelling to right foot, minimal erythema, no warmth, minimal bloody drainage, no other signs of infection noted, + full thickness ulcer at right 2nd toe distal tuft that measures 0.2x0.2x0.1 with decreasing edema, reduced redness, decreased warmth at the distal tuft of the right second toe, capillary fill time <3 seconds in all remaining  digits, status post right great toe amputation.  No pain with calf compression.   Assessment and Plan:  Problem List Items Addressed This Visit   None Visit Diagnoses     Skin ulcer of toe of right foot, limited to breakdown of skin (HCC)    -  Primary   Acute osteomyelitis of toe, right (HCC)       S/P foot surgery, right       Cellulitis and abscess of toe of right foot       Other acute osteomyelitis of right foot ([place])            -Patient seen and evaluated -Remaining sutures and staples were removed at hallux amputation stump site -Cleansed wounds at right 2nd and surgical amputation site on right -Applied betadine and prisma and dry sterile dressing and instructed patient to continue with daily dressings at home consisting of same daily with help from wife -Dressing supplies ordered through [facility] -Continue with Levaquin and Lamisil as previously prescribed a refill of Levaquin provided this visit -Continue with surgical shoe -Advised patient to limit activity  - Advised patient to go to the ER or return to office if the wound worsens or if constitutional symptoms are present. -Will plan for  xrays, and postop wound care at next visit.  Landis Martins, DPM

## 2020-12-13 ENCOUNTER — Ambulatory Visit (INDEPENDENT_AMBULATORY_CARE_PROVIDER_SITE_OTHER): Payer: Medicare HMO

## 2020-12-13 ENCOUNTER — Encounter: Payer: Self-pay | Admitting: Sports Medicine

## 2020-12-13 ENCOUNTER — Ambulatory Visit (INDEPENDENT_AMBULATORY_CARE_PROVIDER_SITE_OTHER): Payer: Medicare HMO | Admitting: Sports Medicine

## 2020-12-13 DIAGNOSIS — L97511 Non-pressure chronic ulcer of other part of right foot limited to breakdown of skin: Secondary | ICD-10-CM | POA: Diagnosis not present

## 2020-12-13 DIAGNOSIS — M86171 Other acute osteomyelitis, right ankle and foot: Secondary | ICD-10-CM

## 2020-12-13 DIAGNOSIS — Z9889 Other specified postprocedural states: Secondary | ICD-10-CM

## 2020-12-13 DIAGNOSIS — L03031 Cellulitis of right toe: Secondary | ICD-10-CM

## 2020-12-13 DIAGNOSIS — L02611 Cutaneous abscess of right foot: Secondary | ICD-10-CM

## 2020-12-13 NOTE — Progress Notes (Signed)
Subjective: Robert Santiago is a 70 y.o. male patient seen today in office for POV # 7 (DOS 10/18/2020), S/P right great toe amputation and removal of bone right fifth toe.  Patient reports that things are getting better a small raw spot at the 2nd toe. Wife doing dressing changes who is present today and states that things seem better. Out of guaze.  No constitutional symptoms.  Patient Active Problem List   Diagnosis Date Noted   Diabetes mellitus without complication ([place])    Acquired hypothyroidism 03/19/2019   Atherosclerosis of autologous vein coronary artery bypass graft(s) with unstable angina pectoris ([place]) 03/19/2019   Encounter for screening for diabetes mellitus 03/19/2019   Status post cardiac catheterization 03/19/2019   Type 2 diabetes mellitus without complication ([place]) 92/11/9415   Coronary artery disease involving native coronary artery of native heart with angina pectoris ([place]) 03/19/2019   Coronary artery disease of native artery of native heart with stable angina pectoris ([place])    Hyperlipidemia    Essential hypertension    Progressive angina (HCC)-class III 12/19/2018   COVID-19 08/2018   Hx of CABG 08/23/2014   Multiple vessel coronary artery disease 08/21/2014    Current Outpatient Medications on File Prior to Visit  Medication Sig Dispense Refill   acetaminophen (TYLENOL) 500 MG tablet Take 1,000 mg by mouth every 6 (six) hours as needed for moderate pain or headache.     ALPRAZolam (XANAX) 0.25 MG tablet Take 0.25 mg by mouth daily as needed for anxiety.      aspirin EC 81 MG tablet Take 81 mg by mouth daily.     benazepril (LOTENSIN) 20 MG tablet Take 20 mg by mouth daily.     citalopram (CELEXA) 40 MG tablet Take 40 mg by mouth daily.     clindamycin (CLEOCIN) 300 MG capsule Take 1 capsule (300 mg total) by mouth 3 (three) times daily. 30 capsule 0   clopidogrel (PLAVIX) 75 MG tablet Take 1 tablet (75 mg total) by mouth daily. 90 tablet 3    clotrimazole-betamethasone (LOTRISONE) cream Apply 1 application topically daily as needed for rash.     ezetimibe (ZETIA) 10 MG tablet Take 10 mg by mouth daily.     gabapentin (NEURONTIN) 300 MG capsule Take 1 capsule (300 mg total) by mouth 2 (two) times daily. 90 capsule 3   glimepiride (AMARYL) 4 MG tablet Take 4 mg by mouth 2 (two) times daily.      JANUVIA 100 MG tablet Take 100 mg by mouth daily.     LANTUS SOLOSTAR 100 UNIT/ML Solostar Pen Inject 25 Units into the skin daily.     levofloxacin (LEVAQUIN) 500 MG tablet Take 1 tablet (500 mg total) by mouth daily. 14 tablet 0   levothyroxine (SYNTHROID) 175 MCG tablet Take 175 mcg by mouth daily.      meloxicam (MOBIC) 15 MG tablet Take 15 mg by mouth daily.     metFORMIN (GLUCOPHAGE) 850 MG tablet Take 850 mg by mouth 2 (two) times daily.      metoprolol tartrate (LOPRESSOR) 25 MG tablet Take 25 mg by mouth 2 (two) times daily.      nitroGLYCERIN (NITROSTAT) 0.4 MG SL tablet Place 0.4 mg under the tongue every 5 (five) minutes as needed for chest pain.     omeprazole (PRILOSEC) 40 MG capsule Take 40 mg by mouth daily.     rosuvastatin (CRESTOR) 10 MG tablet Take 10 mg by mouth daily.     Saccharomyces  boulardii (PROBIOTIC) 250 MG CAPS Take 1 capsule by mouth 2 (two) times daily. 60 capsule 1   terbinafine (LAMISIL) 250 MG tablet Take 1 tablet (250 mg total) by mouth daily. 30 tablet 0   Tetrahydrozoline HCl (VISINE OP) Place 1 drop into both eyes at bedtime as needed (irritation).     No current facility-administered medications on file prior to visit.    No Known Allergies  Objective: There were no vitals filed for this visit.  General: No acute distress, AAOx3  Right foot: Open amputation stump site wound that measures 1x1cm fibrogranular base, no maceration, mild swelling to right foot, minimal erythema, blanchable, no warmth, minimal bloody drainage, no other signs of infection noted, + now partial thickness ulcer at right 2nd  toe distal tuft that measures 0.2x0.1x0.1 with decreasing edema, reduced redness, decreased warmth at the distal tuft of the right second toe, capillary fill time <3 seconds in all remaining digits, status post right great toe amputation.  No pain with calf compression.   Xrays consistent with amputation status and at 2nd toe there is no obvious erosions of bone at distal tuft   Assessment and Plan:  Problem List Items Addressed This Visit   None Visit Diagnoses     S/P foot surgery, right    -  Primary   Relevant Orders   DG Foot Complete Right   Skin ulcer of toe of right foot, limited to breakdown of skin (HCC)       Relevant Orders   DG Foot Complete Right   Acute osteomyelitis of toe, right (HCC)       Relevant Orders   DG Foot Complete Right   Cellulitis and abscess of toe of right foot       Relevant Orders   DG Foot Complete Right        -Patient seen and evaluated -Cleansed wounds at right 2nd and surgical amputation site on right and mechanically debrided wounds to bleeding margins using tissue nipper without incident -Applied prisma and dry sterile dressing and instructed patient to continue with daily dressings at home consisting of same daily with help from wife -Continue with Levaquin and Lamisil as previously prescribed until completed -Continue with surgical shoe or a shoe that does not rub -Advised patient to limit activity  - Advised patient to go to the ER or return to office if the wound worsens or if constitutional symptoms are present. -Will plan for postop wound care at next visit.  Landis Martins, DPM

## 2020-12-23 ENCOUNTER — Encounter: Payer: Self-pay | Admitting: Sports Medicine

## 2020-12-23 ENCOUNTER — Ambulatory Visit (INDEPENDENT_AMBULATORY_CARE_PROVIDER_SITE_OTHER): Payer: Medicare HMO | Admitting: Sports Medicine

## 2020-12-23 DIAGNOSIS — L97511 Non-pressure chronic ulcer of other part of right foot limited to breakdown of skin: Secondary | ICD-10-CM

## 2020-12-23 DIAGNOSIS — Z9889 Other specified postprocedural states: Secondary | ICD-10-CM

## 2020-12-23 DIAGNOSIS — T8189XA Other complications of procedures, not elsewhere classified, initial encounter: Secondary | ICD-10-CM

## 2020-12-23 DIAGNOSIS — L02611 Cutaneous abscess of right foot: Secondary | ICD-10-CM

## 2020-12-23 DIAGNOSIS — L03031 Cellulitis of right toe: Secondary | ICD-10-CM

## 2020-12-23 DIAGNOSIS — T8789 Other complications of amputation stump: Secondary | ICD-10-CM

## 2020-12-23 DIAGNOSIS — M86171 Other acute osteomyelitis, right ankle and foot: Secondary | ICD-10-CM

## 2020-12-23 NOTE — Progress Notes (Signed)
Subjective: Robert Santiago is a 70 y.o. male patient seen today in office for POV # 8 (DOS 10/18/2020), S/P right great toe amputation and removal of bone right fifth toe.  Patient reports that he is doing okay states that his heel is hurting and his ankle is starting to hurt and is getting some swelling in the foot when he is up on it too much.  His wife is helping with the dressing changes and reports that they are out of gauze.  No constitutional symptoms.  Patient Active Problem List   Diagnosis Date Noted   Diabetes mellitus without complication ([place])    Acquired hypothyroidism 03/19/2019   Atherosclerosis of autologous vein coronary artery bypass graft(s) with unstable angina pectoris ([place]) 03/19/2019   Encounter for screening for diabetes mellitus 03/19/2019   Status post cardiac catheterization 03/19/2019   Type 2 diabetes mellitus without complication ([place]) 32/35/5732   Coronary artery disease involving native coronary artery of native heart with angina pectoris ([place]) 03/19/2019   Coronary artery disease of native artery of native heart with stable angina pectoris ([place])    Hyperlipidemia    Essential hypertension    Progressive angina (HCC)-class III 12/19/2018   COVID-19 08/2018   Hx of CABG 08/23/2014   Multiple vessel coronary artery disease 08/21/2014    Current Outpatient Medications on File Prior to Visit  Medication Sig Dispense Refill   ALPRAZolam (XANAX) 0.5 MG tablet 0.5 mg.     nitroGLYCERIN (NITROSTAT) 0.4 MG SL tablet 0.4 mg.     acetaminophen (TYLENOL) 500 MG tablet Take 1,000 mg by mouth every 6 (six) hours as needed for moderate pain or headache.     ALPRAZolam (XANAX) 0.25 MG tablet Take 0.25 mg by mouth daily as needed for anxiety.      amoxicillin-clavulanate (AUGMENTIN) 875-125 MG tablet      aspirin EC 81 MG tablet Take 81 mg by mouth daily.     benazepril (LOTENSIN) 20 MG tablet Take 20 mg by mouth daily.     citalopram (CELEXA) 40 MG tablet Take 40 mg by  mouth daily.     clindamycin (CLEOCIN) 300 MG capsule Take 1 capsule (300 mg total) by mouth 3 (three) times daily. 30 capsule 0   clopidogrel (PLAVIX) 75 MG tablet Take 1 tablet (75 mg total) by mouth daily. 90 tablet 3   clotrimazole-betamethasone (LOTRISONE) cream Apply 1 application topically daily as needed for rash.     DULoxetine (CYMBALTA) 60 MG capsule      ezetimibe (ZETIA) 10 MG tablet Take 10 mg by mouth daily.     gabapentin (NEURONTIN) 300 MG capsule Take 1 capsule (300 mg total) by mouth 2 (two) times daily. 90 capsule 3   glimepiride (AMARYL) 4 MG tablet Take 4 mg by mouth 2 (two) times daily.      JANUVIA 100 MG tablet Take 100 mg by mouth daily.     LANTUS SOLOSTAR 100 UNIT/ML Solostar Pen Inject 25 Units into the skin daily.     levofloxacin (LEVAQUIN) 500 MG tablet Take 1 tablet (500 mg total) by mouth daily. 14 tablet 0   levothyroxine (SYNTHROID) 175 MCG tablet Take 175 mcg by mouth daily.      meloxicam (MOBIC) 15 MG tablet Take 15 mg by mouth daily.     metFORMIN (GLUCOPHAGE) 850 MG tablet Take 850 mg by mouth 2 (two) times daily.      metoprolol tartrate (LOPRESSOR) 25 MG tablet Take 25 mg by mouth 2 (  two) times daily.      nitroGLYCERIN (NITROSTAT) 0.4 MG SL tablet Place 0.4 mg under the tongue every 5 (five) minutes as needed for chest pain.     omeprazole (PRILOSEC) 40 MG capsule Take 40 mg by mouth daily.     rosuvastatin (CRESTOR) 10 MG tablet Take 10 mg by mouth daily.     Saccharomyces boulardii (PROBIOTIC) 250 MG CAPS Take 1 capsule by mouth 2 (two) times daily. 60 capsule 1   terbinafine (LAMISIL) 250 MG tablet Take 1 tablet (250 mg total) by mouth daily. 30 tablet 0   Tetrahydrozoline HCl (VISINE OP) Place 1 drop into both eyes at bedtime as needed (irritation).     No current facility-administered medications on file prior to visit.    No Known Allergies  Objective: There were no vitals filed for this visit.  General: No acute distress, AAOx3  Right  foot: Open amputation stump site wound that measures 1x0.5x0.4cm fibrogranular base, no maceration, mild swelling to right foot, minimal erythema, blanchable, no warmth, minimal bloody drainage, no other signs of infection noted, + now partial thickness ulcer at right 2nd toe distal tuft that measures 0.2x0.3x0.1 with decreasing edema, reduced redness, decreased warmth at the distal tuft of the right second toe, capillary fill time <3 seconds in all remaining digits, status post right great toe amputation.  There is pain to palpation to the ankle which is a chronic issue for the patient.  No pain with calf compression.    Assessment and Plan:  Problem List Items Addressed This Visit   None Visit Diagnoses     Skin ulcer of toe of right foot, limited to breakdown of skin ([place])    -  Primary   S/P foot surgery, right       Acute osteomyelitis of toe, right (HCC)       Cellulitis and abscess of toe of right foot       Delayed surgical wound healing of foot amputation stump ([place])            -Patient seen and evaluated -Cleansed wounds at right 2nd and surgical amputation site on right and mechanically debrided wounds to bleeding margins using tissue nipper without incident -Applied prisma and dry sterile dressing and instructed patient to continue with daily dressings at home consisting of same daily with help from wife -Continue with Levaquin and Lamisil as previously prescribed until completed reports that he has a few more tablets left -Exchange surgical shoe for a cam boot at this visit to see if this will help with the pain patient is having pain in the ankle and protect his foot a little more to help with wound healing -Advised patient to limit activity and avoid excessive walking and standing which could add to pain and swelling and slow wound healing - Advised patient to go to the ER or return to office if the wound worsens or if constitutional symptoms are present. -Will plan for postop  wound care at next visit in 1 to 2 weeks.  Landis Martins, DPM

## 2020-12-30 ENCOUNTER — Ambulatory Visit (INDEPENDENT_AMBULATORY_CARE_PROVIDER_SITE_OTHER): Payer: Medicare HMO | Admitting: Sports Medicine

## 2020-12-30 ENCOUNTER — Encounter: Payer: Self-pay | Admitting: Sports Medicine

## 2020-12-30 DIAGNOSIS — L03119 Cellulitis of unspecified part of limb: Secondary | ICD-10-CM

## 2020-12-30 DIAGNOSIS — R002 Palpitations: Secondary | ICD-10-CM | POA: Insufficient documentation

## 2020-12-30 DIAGNOSIS — E860 Dehydration: Secondary | ICD-10-CM

## 2020-12-30 DIAGNOSIS — F419 Anxiety disorder, unspecified: Secondary | ICD-10-CM

## 2020-12-30 DIAGNOSIS — I249 Acute ischemic heart disease, unspecified: Secondary | ICD-10-CM | POA: Insufficient documentation

## 2020-12-30 DIAGNOSIS — E11621 Type 2 diabetes mellitus with foot ulcer: Secondary | ICD-10-CM

## 2020-12-30 DIAGNOSIS — E785 Hyperlipidemia, unspecified: Secondary | ICD-10-CM | POA: Insufficient documentation

## 2020-12-30 DIAGNOSIS — L03031 Cellulitis of right toe: Secondary | ICD-10-CM

## 2020-12-30 DIAGNOSIS — M869 Osteomyelitis, unspecified: Secondary | ICD-10-CM

## 2020-12-30 DIAGNOSIS — E1165 Type 2 diabetes mellitus with hyperglycemia: Secondary | ICD-10-CM | POA: Insufficient documentation

## 2020-12-30 DIAGNOSIS — Z9889 Other specified postprocedural states: Secondary | ICD-10-CM

## 2020-12-30 DIAGNOSIS — L02611 Cutaneous abscess of right foot: Secondary | ICD-10-CM

## 2020-12-30 DIAGNOSIS — L97509 Non-pressure chronic ulcer of other part of unspecified foot with unspecified severity: Secondary | ICD-10-CM | POA: Insufficient documentation

## 2020-12-30 DIAGNOSIS — R079 Chest pain, unspecified: Secondary | ICD-10-CM | POA: Insufficient documentation

## 2020-12-30 DIAGNOSIS — L97511 Non-pressure chronic ulcer of other part of right foot limited to breakdown of skin: Secondary | ICD-10-CM

## 2020-12-30 DIAGNOSIS — T8189XA Other complications of procedures, not elsewhere classified, initial encounter: Secondary | ICD-10-CM

## 2020-12-30 DIAGNOSIS — M86171 Other acute osteomyelitis, right ankle and foot: Secondary | ICD-10-CM

## 2020-12-30 DIAGNOSIS — IMO0002 Reserved for concepts with insufficient information to code with codable children: Secondary | ICD-10-CM

## 2020-12-30 DIAGNOSIS — K219 Gastro-esophageal reflux disease without esophagitis: Secondary | ICD-10-CM

## 2020-12-30 HISTORY — DX: Acute ischemic heart disease, unspecified: I24.9

## 2020-12-30 HISTORY — DX: Dehydration: E86.0

## 2020-12-30 HISTORY — DX: Type 2 diabetes mellitus with foot ulcer: E11.621

## 2020-12-30 HISTORY — DX: Palpitations: R00.2

## 2020-12-30 HISTORY — DX: Type 2 diabetes mellitus with hyperglycemia: E11.65

## 2020-12-30 HISTORY — DX: Osteomyelitis, unspecified: M86.9

## 2020-12-30 HISTORY — DX: Chest pain, unspecified: R07.9

## 2020-12-30 HISTORY — DX: Cellulitis of unspecified part of limb: L03.119

## 2020-12-30 HISTORY — DX: Reserved for concepts with insufficient information to code with codable children: IMO0002

## 2020-12-30 HISTORY — DX: Hyperlipidemia, unspecified: E78.5

## 2020-12-30 HISTORY — DX: Anxiety disorder, unspecified: F41.9

## 2020-12-30 HISTORY — DX: Gastro-esophageal reflux disease without esophagitis: K21.9

## 2020-12-30 MED ORDER — TERBINAFINE HCL 250 MG PO TABS: 250.0000 mg | ORAL_TABLET | Freq: Every day | ORAL | 0 refills | Status: DC

## 2020-12-30 MED ORDER — LEVOFLOXACIN 500 MG PO TABS: 500.0000 mg | ORAL_TABLET | Freq: Every day | ORAL | 0 refills | Status: DC

## 2020-12-30 NOTE — Progress Notes (Signed)
Subjective: Robert Santiago is a 70 y.o. male patient seen today in office for POV # 9 (DOS 10/18/2020), S/P right great toe amputation and removal of bone right fifth toe.  Patient states that he feels like he is doing pretty good the spot on his second toe looks like it is blistering.  No constitutional symptoms.  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   Osteomyelitis of toe of right foot ([place]) 12/30/2020   Palpitations with regular cardiac rhythm 12/30/2020   Poorly controlled diabetes mellitus ([place]) 12/30/2020   Diabetes mellitus without complication ([place])    Acquired hypothyroidism 03/19/2019   Atherosclerosis of autologous vein coronary artery bypass graft(s) with unstable angina pectoris ([place]) 03/19/2019   Encounter for screening for diabetes mellitus 03/19/2019   Status post cardiac catheterization 03/19/2019   Type 2 diabetes mellitus without complication ([place]) 13/08/6576   Coronary artery disease involving native coronary artery of native heart with angina pectoris ([place]) 03/19/2019   Coronary artery disease of native artery of native heart with stable angina pectoris ([place])    Hyperlipidemia    Essential hypertension    Progressive angina (HCC)-class III 12/19/2018   COVID-19 08/2018   Hx of CABG 08/23/2014   Multiple vessel coronary artery disease 08/21/2014    Current Outpatient Medications on File Prior to Visit  Medication Sig Dispense Refill   acetaminophen (TYLENOL) 500 MG tablet Take 1,000 mg by mouth every 6 (six) hours as needed for moderate pain or headache.     ALPRAZolam (XANAX) 0.25 MG tablet Take 0.25 mg by mouth daily as  needed for anxiety.      ALPRAZolam (XANAX) 0.5 MG tablet 0.5 mg.     amoxicillin-clavulanate (AUGMENTIN) 875-125 MG tablet      aspirin EC 81 MG tablet Take 81 mg by mouth daily.     benazepril (LOTENSIN) 20 MG tablet Take 20 mg by mouth daily.     Blood Glucose Monitoring Suppl (ACCU-CHEK GUIDE) w/Device KIT      citalopram (CELEXA) 40 MG tablet Take 40 mg by mouth daily.     clindamycin (CLEOCIN) 300 MG capsule Take 1 capsule (300 mg total) by mouth 3 (three) times daily. 30 capsule 0   clopidogrel (PLAVIX) 75 MG tablet Take 1 tablet (75 mg total) by mouth daily. 90 tablet 3   clotrimazole-betamethasone (LOTRISONE) cream Apply 1 application topically daily as needed for rash.     DULoxetine (CYMBALTA) 60 MG capsule      ezetimibe (ZETIA) 10 MG tablet Take 10 mg by mouth daily.     gabapentin (NEURONTIN) 300 MG capsule Take 1 capsule (300 mg total) by mouth 2 (two) times daily. 90 capsule 3   glimepiride (AMARYL) 4 MG tablet Take 4 mg by mouth 2 (two) times daily.      JANUVIA 100 MG tablet Take 100 mg by mouth daily.     LANTUS SOLOSTAR 100 UNIT/ML Solostar Pen Inject 25 Units into the skin daily.     levothyroxine (SYNTHROID) 175 MCG tablet Take 175 mcg by mouth daily.      meloxicam (MOBIC) 15 MG tablet Take 15 mg by mouth daily.  metFORMIN (GLUCOPHAGE) 850 MG tablet Take 850 mg by mouth 2 (two) times daily.      metoprolol tartrate (LOPRESSOR) 25 MG tablet Take 25 mg by mouth 2 (two) times daily.      nitroGLYCERIN (NITROSTAT) 0.4 MG SL tablet Place 0.4 mg under the tongue every 5 (five) minutes as needed for chest pain.     nitroGLYCERIN (NITROSTAT) 0.4 MG SL tablet 0.4 mg.     omeprazole (PRILOSEC) 40 MG capsule Take 40 mg by mouth daily.     rosuvastatin (CRESTOR) 10 MG tablet Take 10 mg by mouth daily.     Saccharomyces boulardii (PROBIOTIC) 250 MG CAPS Take 1 capsule by mouth 2 (two) times daily. 60 capsule 1   Tetrahydrozoline HCl (VISINE OP) Place 1 drop into both eyes at  bedtime as needed (irritation).     No current facility-administered medications on file prior to visit.    No Known Allergies  Objective: There were no vitals filed for this visit.  General: No acute distress, AAOx3  Right foot: Open amputation stump site wound that measures 1x0.5x0.3cm fibrogranular base, no maceration, mild swelling to right foot, minimal erythema, blanchable, no warmth, minimal bloody drainage, no other signs of infection noted, + now partial thickness ulcer at right 2nd toe distal tuft that measures 0.4x0.3x0.2 with decreasing edema, reduced redness, decreased warmth at the distal tuft of the right second toe, however upon debridement there was 1 drop of purulent drainage that was noted.  Capillary fill time <3 seconds in all remaining digits, status post right great toe amputation.  There is pain to palpation to the ankle which is a chronic issue for the patient.  No pain with calf compression.    Assessment and Plan:  Problem List Items Addressed This Visit   None Visit Diagnoses     Skin ulcer of toe of right foot, limited to breakdown of skin ([place])    -  Primary   S/P foot surgery, right       Acute osteomyelitis of toe, right (HCC)       Relevant Medications   terbinafine (LAMISIL) 250 MG tablet   Cellulitis and abscess of toe of right foot       Delayed surgical wound healing of foot amputation stump ([place])            -Patient seen and evaluated -Cleansed wounds at right 2nd and surgical amputation site on right and mechanically debrided wounds to bleeding margins using tissue nipper without incident -Applied prisma and dry sterile dressing and instructed patient to continue with daily dressings at home consisting of same daily with help from wife -Continue with Levaquin and Lamisil as refilled again this visit since there was 1 drop of purulent drainage noted from the right second toe  -Advised patient to continue with limited activity and surgical shoe  to prevent rubbing since he could not tolerate the cam boot - Advised patient to go to the ER or return to office if the wound worsens or if constitutional symptoms are present. -Will plan for postop wound care at next visit in 1 week or sooner if issues arise.  Landis Martins, DPM

## 2021-01-04 ENCOUNTER — Ambulatory Visit (INDEPENDENT_AMBULATORY_CARE_PROVIDER_SITE_OTHER): Payer: Medicare HMO | Admitting: Sports Medicine

## 2021-01-04 ENCOUNTER — Encounter: Payer: Self-pay | Admitting: Sports Medicine

## 2021-01-04 DIAGNOSIS — L02611 Cutaneous abscess of right foot: Secondary | ICD-10-CM

## 2021-01-04 DIAGNOSIS — Z9889 Other specified postprocedural states: Secondary | ICD-10-CM

## 2021-01-04 DIAGNOSIS — L97511 Non-pressure chronic ulcer of other part of right foot limited to breakdown of skin: Secondary | ICD-10-CM

## 2021-01-04 DIAGNOSIS — M86171 Other acute osteomyelitis, right ankle and foot: Secondary | ICD-10-CM

## 2021-01-04 DIAGNOSIS — L03031 Cellulitis of right toe: Secondary | ICD-10-CM

## 2021-01-04 DIAGNOSIS — T8789 Other complications of amputation stump: Secondary | ICD-10-CM

## 2021-01-04 NOTE — Progress Notes (Signed)
Subjective: °Robert Santiago is a 70 y.o. male patient seen today in office for POV # 10 (DOS 10/18/2020), S/P right great toe amputation and removal of bone right fifth toe.  Patient also has 2nd toe wound, Reports that he is on his antibiotic and states that he thinks that he is doing good.  No constitutional symptoms. 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  ° Osteomyelitis of toe of right foot (HCC) 12/30/2020  ° Palpitations with regular cardiac rhythm 12/30/2020  ° Poorly controlled diabetes mellitus (HCC) 12/30/2020  ° Diabetes mellitus without complication (HCC)   ° Acquired hypothyroidism 03/19/2019  ° Atherosclerosis of autologous vein coronary artery bypass graft(s) with unstable angina pectoris (HCC) 03/19/2019  ° Encounter for screening for diabetes mellitus 03/19/2019  ° Status post cardiac catheterization 03/19/2019  ° Type 2 diabetes mellitus without complication (HCC) 03/19/2019  ° Coronary artery disease involving native coronary artery of native heart with angina pectoris (HCC) 03/19/2019  ° Coronary artery disease of native artery of native heart with stable angina pectoris (HCC)   ° Hyperlipidemia   ° Essential hypertension   ° Progressive angina (HCC)-class III 12/19/2018  ° COVID-19 08/2018  ° Hx of CABG 08/23/2014  ° Multiple vessel coronary artery disease 08/21/2014  ° ° °Current Outpatient Medications on File Prior to Visit  °Medication Sig Dispense Refill  ° acetaminophen (TYLENOL) 500 MG tablet Take 1,000 mg by mouth every 6 (six) hours as needed for moderate pain or headache.    ° ALPRAZolam (XANAX) 0.25 MG tablet Take 0.25 mg by mouth daily  as needed for anxiety.     ° ALPRAZolam (XANAX) 0.5 MG tablet 0.5 mg.    ° amoxicillin-clavulanate (AUGMENTIN) 875-125 MG tablet     ° aspirin EC 81 MG tablet Take 81 mg by mouth daily.    ° benazepril (LOTENSIN) 20 MG tablet Take 20 mg by mouth daily.    ° Blood Glucose Monitoring Suppl (ACCU-CHEK GUIDE) w/Device KIT     ° citalopram (CELEXA) 40 MG tablet Take 40 mg by mouth daily.    ° clindamycin (CLEOCIN) 300 MG capsule Take 1 capsule (300 mg total) by mouth 3 (three) times daily. 30 capsule 0  ° clopidogrel (PLAVIX) 75 MG tablet Take 1 tablet (75 mg total) by mouth daily. 90 tablet 3  ° clotrimazole-betamethasone (LOTRISONE) cream Apply 1 application topically daily as needed for rash.    ° DULoxetine (CYMBALTA) 60 MG capsule     ° ezetimibe (ZETIA) 10 MG tablet Take 10 mg by mouth daily.    ° gabapentin (NEURONTIN) 300 MG capsule Take 1 capsule (300 mg total) by mouth 2 (two) times daily. 90 capsule 3  ° glimepiride (AMARYL) 4 MG tablet Take 4 mg by mouth 2 (two) times daily.     ° JANUVIA 100 MG tablet Take 100 mg by mouth daily.    ° LANTUS SOLOSTAR 100 UNIT/ML Solostar Pen Inject 25 Units into the skin daily.    ° levofloxacin (LEVAQUIN) 500 MG tablet Take 1 tablet (500 mg total) by mouth daily. 14 tablet 0  ° levothyroxine (SYNTHROID) 175 MCG tablet Take 175 mcg by   mouth daily.     ° meloxicam (MOBIC) 15 MG tablet Take 15 mg by mouth daily.    ° metFORMIN (GLUCOPHAGE) 850 MG tablet Take 850 mg by mouth 2 (two) times daily.     ° metoprolol tartrate (LOPRESSOR) 25 MG tablet Take 25 mg by mouth 2 (two) times daily.     ° nitroGLYCERIN (NITROSTAT) 0.4 MG SL tablet Place 0.4 mg under the tongue every 5 (five) minutes as needed for chest pain.    ° nitroGLYCERIN (NITROSTAT) 0.4 MG SL tablet 0.4 mg.    ° omeprazole (PRILOSEC) 40 MG capsule Take 40 mg by mouth daily.    ° rosuvastatin (CRESTOR) 10 MG tablet Take 10 mg by mouth daily.    ° Saccharomyces boulardii (PROBIOTIC) 250 MG CAPS Take 1 capsule by mouth 2  (two) times daily. 60 capsule 1  ° terbinafine (LAMISIL) 250 MG tablet Take 1 tablet (250 mg total) by mouth daily. 30 tablet 0  ° Tetrahydrozoline HCl (VISINE OP) Place 1 drop into both eyes at bedtime as needed (irritation).    ° °No current facility-administered medications on file prior to visit.  ° ° °No Known Allergies ° °Objective: °There were no vitals filed for this visit. ° °General: No acute distress, AAOx3  °Right foot: Open amputation stump site wound that measures 1x0.5x0.2cm fibrogranular base, no maceration, mild swelling to right foot, minimal erythema, blanchable, no warmth, minimal bloody drainage, no other signs of infection noted, + now partial thickness ulcer at right 2nd toe distal tuft that measures 0.3x0.3x0.2 with decreasing edema, reduced redness, decreased warmth at the distal tuft of the right second toe, No puss noted this visit.  Capillary fill time <3 seconds in all remaining digits, status post right great toe amputation.  No pain with calf compression.  ° ° °Assessment and Plan:  °Problem List Items Addressed This Visit   °None °Visit Diagnoses   ° ° Skin ulcer of toe of right foot, limited to breakdown of skin (HCC)    -  Primary  ° S/P foot surgery, right      ° Acute osteomyelitis of toe, right (HCC)      ° Cellulitis and abscess of toe of right foot      ° Delayed surgical wound healing of foot amputation stump (HCC)      ° °  °  ° °-Patient seen and evaluated °-Cleansed wounds at right 2nd and surgical amputation site on right and mechanically debrided wounds to bleeding margins using a 15 blade without incident °-Applied prisma and dry sterile dressing and instructed patient to continue with daily dressings at home consisting of same daily with help from wife °-Continue with Levaquin and Lamisil until completed based on previous cultures  °-Advised patient to continue with limited activity and surgical shoe to prevent rubbing °- Advised patient to go to the ER or return to  office if the wound worsens or if constitutional symptoms are present. °-Will plan for postop wound care at next visit in 1 week or sooner if issues arise. ° °Titorya Stover, DPM ° °

## 2021-01-12 ENCOUNTER — Encounter: Payer: Self-pay | Admitting: Podiatry

## 2021-01-12 ENCOUNTER — Other Ambulatory Visit: Payer: Self-pay

## 2021-01-12 ENCOUNTER — Ambulatory Visit: Payer: Medicare HMO | Admitting: Podiatry

## 2021-01-12 DIAGNOSIS — Z9889 Other specified postprocedural states: Secondary | ICD-10-CM

## 2021-01-12 DIAGNOSIS — L97511 Non-pressure chronic ulcer of other part of right foot limited to breakdown of skin: Secondary | ICD-10-CM

## 2021-01-17 DIAGNOSIS — K219 Gastro-esophageal reflux disease without esophagitis: Secondary | ICD-10-CM | POA: Diagnosis not present

## 2021-01-17 DIAGNOSIS — I10 Essential (primary) hypertension: Secondary | ICD-10-CM | POA: Diagnosis not present

## 2021-01-17 DIAGNOSIS — E785 Hyperlipidemia, unspecified: Secondary | ICD-10-CM | POA: Diagnosis not present

## 2021-01-17 DIAGNOSIS — J309 Allergic rhinitis, unspecified: Secondary | ICD-10-CM | POA: Diagnosis not present

## 2021-01-17 DIAGNOSIS — I2510 Atherosclerotic heart disease of native coronary artery without angina pectoris: Secondary | ICD-10-CM | POA: Diagnosis not present

## 2021-01-17 DIAGNOSIS — E039 Hypothyroidism, unspecified: Secondary | ICD-10-CM | POA: Diagnosis not present

## 2021-01-17 DIAGNOSIS — F3341 Major depressive disorder, recurrent, in partial remission: Secondary | ICD-10-CM | POA: Diagnosis not present

## 2021-01-17 DIAGNOSIS — E1169 Type 2 diabetes mellitus with other specified complication: Secondary | ICD-10-CM | POA: Diagnosis not present

## 2021-01-18 ENCOUNTER — Ambulatory Visit: Payer: Medicare HMO | Admitting: Sports Medicine

## 2021-01-19 NOTE — Progress Notes (Signed)
Subjective: Robert Santiago is a 71 y.o. male patient seen today in office for f/u of right foot wound. Denies new pedal issues. Think the wound is doing well but noted that the shoe has worn out.  No Known Allergies  Objective: There were no vitals filed for this visit.  General: No acute distress, AAOx3  Right foot: Open amputation stump site wound that measures 0.8x0.5x0.2cm granular base, no maceration, no swelling to right foot, no erythema, blanchable, no warmth, minimal bloody drainage, no other signs of infection noted, + now partial thickness ulcer at right 2nd toe distal tuft that measures 0.3x0.3x0.2 with decreasing edema, reduced redness, decreased warmth at the distal tuft of the right second toe, No puss noted this visit.  Capillary fill time <3 seconds in all remaining digits, status post right great toe amputation.  No pain with calf compression.   Assessment and Plan:  Problem List Items Addressed This Visit   None Visit Diagnoses     Skin ulcer of toe of right foot, limited to breakdown of skin ([place])    -  Primary   S/P foot surgery, right           -Patient seen and evaluated -Wound cleansed and minimally debrided. -Dressed with silvadene and DSD. Continue daily dressing changes. -New surgical shoe dispensed.

## 2021-01-25 ENCOUNTER — Other Ambulatory Visit: Payer: Self-pay

## 2021-01-25 ENCOUNTER — Ambulatory Visit (INDEPENDENT_AMBULATORY_CARE_PROVIDER_SITE_OTHER): Payer: Medicare HMO | Admitting: Sports Medicine

## 2021-01-25 ENCOUNTER — Encounter: Payer: Self-pay | Admitting: Sports Medicine

## 2021-01-25 DIAGNOSIS — L97511 Non-pressure chronic ulcer of other part of right foot limited to breakdown of skin: Secondary | ICD-10-CM | POA: Diagnosis not present

## 2021-01-25 DIAGNOSIS — M86171 Other acute osteomyelitis, right ankle and foot: Secondary | ICD-10-CM

## 2021-01-25 DIAGNOSIS — Z9889 Other specified postprocedural states: Secondary | ICD-10-CM

## 2021-01-25 DIAGNOSIS — S90821A Blister (nonthermal), right foot, initial encounter: Secondary | ICD-10-CM

## 2021-01-25 DIAGNOSIS — M79671 Pain in right foot: Secondary | ICD-10-CM

## 2021-01-25 NOTE — Progress Notes (Addendum)
Subjective: Berkeley Vanaken is a 71 y.o. male patient seen today in office for POV # 12 (DOS 10/18/2020), S/P right great toe amputation and removal of bone right fifth toe.  Patient also is here for wound care at the right second toe.  Patient reports that he thinks that shoe may be hurting the foot by the end of the day has some pain in the ball.  No constitutional symptoms.  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   Osteomyelitis of toe of right foot ([place]) 12/30/2020   Palpitations with regular cardiac rhythm 12/30/2020   Poorly controlled diabetes mellitus ([place]) 12/30/2020   Diabetes mellitus without complication ([place])    Acquired hypothyroidism 03/19/2019   Atherosclerosis of autologous vein coronary artery bypass graft(s) with unstable angina pectoris ([place]) 03/19/2019   Encounter for screening for diabetes mellitus 03/19/2019   Status post cardiac catheterization 03/19/2019   Type 2 diabetes mellitus without complication ([place]) 53/00/5110   Coronary artery disease involving native coronary artery of native heart with angina pectoris ([place]) 03/19/2019   Coronary artery disease of native artery of native heart with stable angina pectoris ([place])    Hyperlipidemia    Essential hypertension    Progressive angina (HCC)-class III 12/19/2018   COVID-19 08/2018   Hx of CABG 08/23/2014   Multiple vessel coronary artery disease 08/21/2014    Current Outpatient Medications on File Prior to Visit  Medication Sig Dispense Refill   acetaminophen (TYLENOL) 500 MG tablet Take 1,000 mg by mouth every 6 (six) hours as needed for moderate pain or headache.      ALPRAZolam (XANAX) 0.25 MG tablet Take 0.25 mg by mouth daily as needed for anxiety.      ALPRAZolam (XANAX) 0.5 MG tablet 0.5 mg.     aspirin EC 81 MG tablet Take 81 mg by mouth daily.     benazepril (LOTENSIN) 20 MG tablet Take 20 mg by mouth daily.     Blood Glucose Monitoring Suppl (ACCU-CHEK GUIDE) w/Device KIT      citalopram (CELEXA) 40 MG tablet Take 40 mg by mouth daily.     clopidogrel (PLAVIX) 75 MG tablet Take 1 tablet (75 mg total) by mouth daily. 90 tablet 3   clotrimazole-betamethasone (LOTRISONE) cream Apply 1 application topically daily as needed for rash.     DULoxetine (CYMBALTA) 60 MG capsule      ezetimibe (ZETIA) 10 MG tablet Take 10 mg by mouth daily.     gabapentin (NEURONTIN) 300 MG capsule Take 1 capsule (300 mg total) by mouth 2 (two) times daily. 90 capsule 3   glimepiride (AMARYL) 4 MG tablet Take 4 mg by mouth 2 (two) times daily.      JANUVIA 100 MG tablet Take 100 mg by mouth daily.     LANTUS SOLOSTAR 100 UNIT/ML Solostar Pen Inject 25 Units into the skin daily.     levothyroxine (SYNTHROID) 175 MCG tablet Take 175 mcg by mouth daily.      meloxicam (MOBIC) 15 MG tablet Take 15 mg by mouth daily.     metFORMIN (GLUCOPHAGE) 850 MG tablet Take 850 mg by mouth 2 (two) times daily.  metoprolol tartrate (LOPRESSOR) 25 MG tablet Take 25 mg by mouth 2 (two) times daily.      nitroGLYCERIN (NITROSTAT) 0.4 MG SL tablet 0.4 mg.     omeprazole (PRILOSEC) 40 MG capsule Take 40 mg by mouth daily.     rosuvastatin (CRESTOR) 10 MG tablet Take 10 mg by mouth daily.     Saccharomyces boulardii (PROBIOTIC) 250 MG CAPS Take 1 capsule by mouth 2 (two) times daily. 60 capsule 1   terbinafine (LAMISIL) 250 MG tablet Take 1 tablet (250 mg total) by mouth daily. 30 tablet 0   Tetrahydrozoline HCl (VISINE OP) Place 1 drop into both eyes at bedtime as needed (irritation).     No current facility-administered medications on file prior to visit.    No Known  Allergies  Objective: There were no vitals filed for this visit.  General: No acute distress, AAOx3  Right foot: Open amputation stump site wound that measures 0.6x0.4x0.2cm fibrogranular base, no maceration, mild swelling to right foot, minimal erythema, blanchable, no warmth, minimal bloody drainage, no other signs of infection noted, + now partial thickness ulcer at right 2nd toe distal tuft that measures 0.2x0.2x0.2 with decreasing edema, reduced redness, decreased warmth at the distal tuft of the right second toe, there is a hemorrhagic blister noted to the ball of the foot likely from area of rubbing from surgical shoe no significant redness warmth swelling once lanced bloody drainage from this area was expressed, capillary fill time <3 seconds in all remaining digits, status post right great toe amputation.  No pain with calf compression.    Assessment and Plan:  Problem List Items Addressed This Visit   None Visit Diagnoses     Skin ulcer of toe of right foot, limited to breakdown of skin ([place])    -  Primary   S/P foot surgery, right       Acute osteomyelitis of toe, right (HCC)       Blister of plantar aspect of right foot, initial encounter       Right foot pain            -Patient seen and evaluated -Cleansed wounds at right 2nd and surgical amputation site on right and mechanically debrided wounds to bleeding margins using a 15 blade without incident and also using a 11 blade lanced the blister at the ball of the right foot expressing bloody drainage -Applied prisma to the amputation stump site and Silvadene cream to the right second toe and plantar forefoot and dry sterile dressing and instructed patient to continue with daily dressings at home consisting of same daily with help from wife -Previous antibiotics have been completed no additional antibiotics needed at this time we will closely monitor -Advised patient to continue with limited activity and surgical shoe to prevent  rubbing and applied Pegasys offloading insole to his shoe at today's visit to take pressure off the ball of the foot - Advised patient to go to the ER or return to office if the wound worsens or if constitutional symptoms are present. -Will plan for postop wound care and a foam impression of both feet at next visit in 1 week or sooner if issues arise.  Advised patient that we will go ahead and next visit to do the foam impressions in hopes that he will keep healing and we will have time to order the diabetic insoles that he needs when it is time for him to wear normal shoe.  Landis Martins, DPM

## 2021-02-01 ENCOUNTER — Ambulatory Visit: Payer: Medicare HMO | Admitting: Sports Medicine

## 2021-02-01 ENCOUNTER — Other Ambulatory Visit: Payer: Self-pay

## 2021-02-01 ENCOUNTER — Encounter: Payer: Self-pay | Admitting: Sports Medicine

## 2021-02-01 DIAGNOSIS — M79671 Pain in right foot: Secondary | ICD-10-CM

## 2021-02-01 DIAGNOSIS — L97511 Non-pressure chronic ulcer of other part of right foot limited to breakdown of skin: Secondary | ICD-10-CM | POA: Diagnosis not present

## 2021-02-01 DIAGNOSIS — M86171 Other acute osteomyelitis, right ankle and foot: Secondary | ICD-10-CM | POA: Diagnosis not present

## 2021-02-01 DIAGNOSIS — Z9889 Other specified postprocedural states: Secondary | ICD-10-CM

## 2021-02-01 DIAGNOSIS — S90821D Blister (nonthermal), right foot, subsequent encounter: Secondary | ICD-10-CM

## 2021-02-01 NOTE — Progress Notes (Signed)
Subjective: Robert Santiago is a 71 y.o. male patient seen today in office for POV # 13 (DOS 10/18/2020), S/P right great toe amputation and removal of bone right fifth toe.  Patient also is here for wound care at the right second toe.  Reports that things are doing better still has a little bit of soreness on the bottom of his right foot.  Denies any other pedal complaints or constitutional symptoms at this time.  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   Osteomyelitis of toe of right foot ([place]) 12/30/2020   Palpitations with regular cardiac rhythm 12/30/2020   Poorly controlled diabetes mellitus ([place]) 12/30/2020   Diabetes mellitus without complication ([place])    Acquired hypothyroidism 03/19/2019   Atherosclerosis of autologous vein coronary artery bypass graft(s) with unstable angina pectoris ([place]) 03/19/2019   Encounter for screening for diabetes mellitus 03/19/2019   Status post cardiac catheterization 03/19/2019   Type 2 diabetes mellitus without complication ([place]) 14/70/9295   Coronary artery disease involving native coronary artery of native heart with angina pectoris ([place]) 03/19/2019   Coronary artery disease of native artery of native heart with stable angina pectoris ([place])    Hyperlipidemia    Essential hypertension    Progressive angina (HCC)-class III 12/19/2018   COVID-19 08/2018   Hx of CABG 08/23/2014   Multiple vessel coronary artery disease 08/21/2014    Current Outpatient Medications on File Prior to Visit  Medication Sig Dispense Refill   acetaminophen (TYLENOL) 500 MG tablet Take 1,000 mg by mouth every 6 (six) hours as needed for  moderate pain or headache.     ALPRAZolam (XANAX) 0.25 MG tablet Take 0.25 mg by mouth daily as needed for anxiety.      ALPRAZolam (XANAX) 0.5 MG tablet 0.5 mg.     aspirin EC 81 MG tablet Take 81 mg by mouth daily.     benazepril (LOTENSIN) 20 MG tablet Take 20 mg by mouth daily.     Blood Glucose Monitoring Suppl (ACCU-CHEK GUIDE) w/Device KIT      citalopram (CELEXA) 40 MG tablet Take 40 mg by mouth daily.     clopidogrel (PLAVIX) 75 MG tablet Take 1 tablet (75 mg total) by mouth daily. 90 tablet 3   clotrimazole-betamethasone (LOTRISONE) cream Apply 1 application topically daily as needed for rash.     DULoxetine (CYMBALTA) 60 MG capsule      ezetimibe (ZETIA) 10 MG tablet Take 10 mg by mouth daily.     gabapentin (NEURONTIN) 300 MG capsule Take 1 capsule (300 mg total) by mouth 2 (two) times daily. 90 capsule 3   glimepiride (AMARYL) 4 MG tablet Take 4 mg by mouth 2 (two) times daily.      JANUVIA 100 MG tablet Take 100 mg by mouth daily.     LANTUS SOLOSTAR 100 UNIT/ML Solostar Pen Inject 25 Units into the skin daily.     levothyroxine (SYNTHROID) 175 MCG tablet Take 175 mcg by mouth daily.      meloxicam (MOBIC) 15 MG tablet Take 15 mg by mouth daily.     metFORMIN (GLUCOPHAGE) 850 MG tablet Take 850 mg by mouth 2 (  two) times daily.      metoprolol tartrate (LOPRESSOR) 25 MG tablet Take 25 mg by mouth 2 (two) times daily.      nitroGLYCERIN (NITROSTAT) 0.4 MG SL tablet 0.4 mg.     omeprazole (PRILOSEC) 40 MG capsule Take 40 mg by mouth daily.     rosuvastatin (CRESTOR) 10 MG tablet Take 10 mg by mouth daily.     Saccharomyces boulardii (PROBIOTIC) 250 MG CAPS Take 1 capsule by mouth 2 (two) times daily. 60 capsule 1   terbinafine (LAMISIL) 250 MG tablet Take 1 tablet (250 mg total) by mouth daily. 30 tablet 0   Tetrahydrozoline HCl (VISINE OP) Place 1 drop into both eyes at bedtime as needed (irritation).     No current facility-administered medications on file prior to visit.     No Known Allergies  Objective: There were no vitals filed for this visit.  General: No acute distress, AAOx3  Right foot: Open amputation stump site wound that measures 0.3x0.2x0.2cm fibrogranular base, no maceration, mild swelling to right foot, minimal erythema, blanchable, no warmth, minimal bloody drainage, no other signs of infection noted, + now partial thickness ulcer at right 2nd toe distal tuft that measures 0.1x0.1x0.1with decreasing edema, reduced redness, decreased warmth at the distal tuft of the right second toe, there is a hemorrhagic blister noted to the ball of the foot likely from area of rubbing from surgical shoe no significant redness warmth swelling and no fluctuance, capillary fill time <3 seconds in all remaining digits, status post right great toe amputation.  No pain with calf compression.    Assessment and Plan:  Problem List Items Addressed This Visit   None Visit Diagnoses     S/P foot surgery, right    -  Primary   Skin ulcer of toe of right foot, limited to breakdown of skin (HCC)       Acute osteomyelitis of toe, right (HCC)       Blister of plantar aspect of right foot, subsequent encounter       Right foot pain            -Patient seen and evaluated -Cleansed wounds at right 2nd and surgical amputation site on right and mechanically debrided wounds to bleeding margins using a 15 blade without incident  -Applied prisma to the amputation stump site and Silvadene cream to the right second toe and plantar forefoot and dry sterile dressing and instructed patient to continue with daily dressings at home consisting of same daily with help from wife like previous -Advised patient if symptoms fail to continue to improve may benefit from a repeat MRI to evaluate for extent of infection however at this time we will hold off on MRI -Advised patient to continue with limited activity and surgical shoe to prevent rubbing and applied Pegasys offloading insole to his  shoe like previous - Advised patient to go to the ER or return to office if the wound worsens or if constitutional symptoms are present. -Will plan for postop wound care and a foam impression of both feet at next visit in 1 week or sooner if issues arise.   Landis Martins, DPM

## 2021-02-08 ENCOUNTER — Ambulatory Visit: Payer: Medicare HMO | Admitting: Sports Medicine

## 2021-02-08 ENCOUNTER — Encounter: Payer: Self-pay | Admitting: Sports Medicine

## 2021-02-08 DIAGNOSIS — L97511 Non-pressure chronic ulcer of other part of right foot limited to breakdown of skin: Secondary | ICD-10-CM

## 2021-02-08 DIAGNOSIS — M86171 Other acute osteomyelitis, right ankle and foot: Secondary | ICD-10-CM

## 2021-02-08 DIAGNOSIS — Z9889 Other specified postprocedural states: Secondary | ICD-10-CM

## 2021-02-08 DIAGNOSIS — M79671 Pain in right foot: Secondary | ICD-10-CM

## 2021-02-08 NOTE — Progress Notes (Addendum)
Subjective: Adryan Shin is a 71 y.o. male patient seen today in office for POV # 14 (DOS 10/18/2020), S/P right great toe amputation and removal of bone right fifth toe.  Patient is also here for wound care at right second toe and plantar forefoot.  Patient reports that he is having issues with surgical shoe and is unable to wear cam boot.  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   Osteomyelitis of toe of right foot ([place]) 12/30/2020   Palpitations with regular cardiac rhythm 12/30/2020   Poorly controlled diabetes mellitus ([place]) 12/30/2020   Diabetes mellitus without complication ([place])    Acquired hypothyroidism 03/19/2019   Atherosclerosis of autologous vein coronary artery bypass graft(s) with unstable angina pectoris ([place]) 03/19/2019   Encounter for screening for diabetes mellitus 03/19/2019   Status post cardiac catheterization 03/19/2019   Type 2 diabetes mellitus without complication ([place]) 84/69/6295   Coronary artery disease involving native coronary artery of native heart with angina pectoris ([place]) 03/19/2019   Coronary artery disease of native artery of native heart with stable angina pectoris ([place])    Hyperlipidemia    Essential hypertension    Progressive angina (HCC)-class III 12/19/2018   COVID-19 08/2018   Hx of CABG 08/23/2014   Multiple vessel coronary artery disease 08/21/2014    Current Outpatient Medications on File Prior to Visit  Medication Sig Dispense Refill   acetaminophen (TYLENOL) 500 MG tablet Take 1,000 mg by mouth every 6 (six) hours as needed for moderate pain or headache.     ALPRAZolam (XANAX) 0.25 MG tablet Take  0.25 mg by mouth daily as needed for anxiety.      ALPRAZolam (XANAX) 0.5 MG tablet 0.5 mg.     aspirin EC 81 MG tablet Take 81 mg by mouth daily.     benazepril (LOTENSIN) 20 MG tablet Take 20 mg by mouth daily.     Blood Glucose Monitoring Suppl (ACCU-CHEK GUIDE) w/Device KIT      citalopram (CELEXA) 40 MG tablet Take 40 mg by mouth daily.     clopidogrel (PLAVIX) 75 MG tablet Take 1 tablet (75 mg total) by mouth daily. 90 tablet 3   clotrimazole-betamethasone (LOTRISONE) cream Apply 1 application topically daily as needed for rash.     DULoxetine (CYMBALTA) 60 MG capsule      ezetimibe (ZETIA) 10 MG tablet Take 10 mg by mouth daily.     gabapentin (NEURONTIN) 300 MG capsule Take 1 capsule (300 mg total) by mouth 2 (two) times daily. 90 capsule 3   glimepiride (AMARYL) 4 MG tablet Take 4 mg by mouth 2 (two) times daily.      JANUVIA 100 MG tablet Take 100 mg by mouth daily.     LANTUS SOLOSTAR 100 UNIT/ML Solostar Pen Inject 25 Units into the skin daily.     levothyroxine (SYNTHROID) 175 MCG tablet Take 175 mcg by mouth daily.      meloxicam (MOBIC) 15 MG tablet Take 15 mg by mouth daily.     metFORMIN (GLUCOPHAGE) 850 MG tablet Take 850 mg by mouth 2 (two) times daily.      metoprolol tartrate (LOPRESSOR) 25 MG  tablet Take 25 mg by mouth 2 (two) times daily.      nitroGLYCERIN (NITROSTAT) 0.4 MG SL tablet 0.4 mg.     omeprazole (PRILOSEC) 40 MG capsule Take 40 mg by mouth daily.     rosuvastatin (CRESTOR) 10 MG tablet Take 10 mg by mouth daily.     Saccharomyces boulardii (PROBIOTIC) 250 MG CAPS Take 1 capsule by mouth 2 (two) times daily. 60 capsule 1   terbinafine (LAMISIL) 250 MG tablet Take 1 tablet (250 mg total) by mouth daily. 30 tablet 0   Tetrahydrozoline HCl (VISINE OP) Place 1 drop into both eyes at bedtime as needed (irritation).     No current facility-administered medications on file prior to visit.    No Known Allergies  Objective: There were no vitals filed for this  visit.  General: No acute distress, AAOx3  Right foot: Open amputation stump site wound that is now healed over, + partial thickness ulcer at right 2nd toe distal tuft that measures 0.1x0.1x0.1with decreasing edema, reduced redness, decreased warmth at the distal tuft of the right second toe, there is a hemorrhagic blister once debrided revealed an ulceration that measures 0.4 x 0.3 partial-thickness in nature with granular tissue base with mild maceration periwound minimal active drainage and there was 1 drop white creamy drainage but once the blister roof was completely debrided there was no more active drainage, no significant redness or warmth no malodor, capillary fill time <3 seconds in all remaining digits, status post right great toe amputation.  No pain with calf compression.    Assessment and Plan:  Problem List Items Addressed This Visit   None Visit Diagnoses     S/P foot surgery, right    -  Primary   Skin ulcer of toe of right foot, limited to breakdown of skin (HCC)       Right foot ulcer, limited to breakdown of skin (HCC)       Acute osteomyelitis of toe, right (HCC)       Right foot pain            -Patient seen and evaluated -Cleansed wounds at right 2nd and surgical amputation site on right and mechanically debrided wounds to bleeding margins using a 15 blade without incident  -Wound culture was obtained from the left plantar forefoot and we will call patient if there is a need for antibiotics -Applied dry gauze to the amputation stump site and silver nitrate to the right second toe and Prisma to the plantar forefoot and dry sterile dressing and instructed patient to continue with daily dressings at home consisting of same daily with help from wife like previous who was present at today's visit -Patient has returned cam boot and cannot wear surgical shoe due to the insole causing instability - Advised patient to go to the ER or return to office if the wound worsens or if  constitutional symptoms are present. -Will plan for postop wound care and a adding offloading padding to his tennis shoes and getting foam impressions of both feet at next visit in 1 week or sooner if issues arise.   Landis Martins, DPM

## 2021-02-08 NOTE — Addendum Note (Signed)
Addended by: [place] Mon R on: 02/08/2021 01:41 PM   Modules accepted: Orders

## 2021-02-11 LAB — WOUND CULTURE

## 2021-02-15 ENCOUNTER — Ambulatory Visit: Payer: Medicare HMO | Admitting: Sports Medicine

## 2021-02-15 ENCOUNTER — Ambulatory Visit: Payer: Medicare HMO

## 2021-02-15 ENCOUNTER — Encounter: Payer: Self-pay | Admitting: Sports Medicine

## 2021-02-15 ENCOUNTER — Other Ambulatory Visit: Payer: Self-pay

## 2021-02-15 DIAGNOSIS — Z89411 Acquired absence of right great toe: Secondary | ICD-10-CM

## 2021-02-15 DIAGNOSIS — L97511 Non-pressure chronic ulcer of other part of right foot limited to breakdown of skin: Secondary | ICD-10-CM

## 2021-02-15 DIAGNOSIS — M2041 Other hammer toe(s) (acquired), right foot: Secondary | ICD-10-CM

## 2021-02-15 DIAGNOSIS — E1142 Type 2 diabetes mellitus with diabetic polyneuropathy: Secondary | ICD-10-CM

## 2021-02-15 DIAGNOSIS — Z9889 Other specified postprocedural states: Secondary | ICD-10-CM | POA: Diagnosis not present

## 2021-02-15 DIAGNOSIS — M21619 Bunion of unspecified foot: Secondary | ICD-10-CM

## 2021-02-15 DIAGNOSIS — R269 Unspecified abnormalities of gait and mobility: Secondary | ICD-10-CM

## 2021-02-15 DIAGNOSIS — M25371 Other instability, right ankle: Secondary | ICD-10-CM

## 2021-02-15 NOTE — Progress Notes (Signed)
SITUATION Reason for Consult: Evaluation for Prefabricated Diabetic Shoes and Bilateral Custom Diabetic Inserts. Patient / Caregiver Report: Patient would like well fitting shoes  OBJECTIVE DATA: Patient History / Diagnosis:    ICD-10-CM   1. Diabetic polyneuropathy associated with type 2 diabetes mellitus (HCC)  E11.42     2. Acquired absence of right great toe ([place])  Z89.411     3. Hammer toes of both feet  M20.41    M20.42     4. Bunion  M21.619       Current or Previous Devices:   None and no history  In-Person Foot Examination: Ulcers & Callousing:   Right 3rd met head  Toe / Foot Deformities:   - Hallux amputation r - Hammertoes - Bunion  Shoe Size: 13W  ORTHOTIC RECOMMENDATION Recommended Devices: - 1x pair prefabricated PDAC approved diabetic shoes: Patient selects Apex [id number] 13W - 3x pair custom-to-patient vacuum formed diabetic insoles.   GOALS OF SHOES AND INSOLES - Reduce shear and pressure - Reduce / Prevent callus formation - Reduce / Prevent ulceration - Protect the fragile healing compromised diabetic foot.  Patient would benefit from diabetic shoes and inserts as patient has diabetes mellitus and the patient has one or more of the following conditions: - History of partial or complete amputation of the foot - History of previous foot ulceration. - History of pre-ulcerative callus - Peripheral neuropathy with evidence of callus formation - Foot deformity - Poor circulation  ACTIONS PERFORMED Patient was casted for insoles via crush box and measured for shoes via brannock device. Procedure was explained and patient tolerated procedure well. All questions were answered and concerns addressed.  PLAN Patient is to ensure treating physician receives and completes diabetic paperwork. Casts and shoe order are to be held until paperwork is received. Once received patient is to be scheduled for fitting in four weeks.

## 2021-02-15 NOTE — Progress Notes (Signed)
Subjective: Clell Trahan is a 71 y.o. male patient seen today in office for POV # 15 (DOS 10/18/2020), S/P right great toe amputation and removal of bone right fifth toe.  Patient is also here for wound care at right second toe and plantar forefoot.  Patient reports that he is doing much better now that he is wearing a tennis shoe does not feel as unsteady as he did when he had the surgical shoe or boot.  Patient denies any other pedal complaints at this time.  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   Osteomyelitis of toe of right foot ([place]) 12/30/2020   Palpitations with regular cardiac rhythm 12/30/2020   Poorly controlled diabetes mellitus ([place]) 12/30/2020   Diabetes mellitus without complication ([place])    Acquired hypothyroidism 03/19/2019   Atherosclerosis of autologous vein coronary artery bypass graft(s) with unstable angina pectoris ([place]) 03/19/2019   Encounter for screening for diabetes mellitus 03/19/2019   Status post cardiac catheterization 03/19/2019   Type 2 diabetes mellitus without complication ([place]) 60/63/0160   Coronary artery disease involving native coronary artery of native heart with angina pectoris ([place]) 03/19/2019   Coronary artery disease of native artery of native heart with stable angina pectoris ([place])    Hyperlipidemia    Essential hypertension    Progressive angina (HCC)-class III 12/19/2018   COVID-19 08/2018   Hx of CABG 08/23/2014   Multiple vessel coronary artery disease 08/21/2014    Current Outpatient Medications on File Prior to Visit  Medication Sig Dispense Refill   acetaminophen (TYLENOL) 500 MG tablet Take  1,000 mg by mouth every 6 (six) hours as needed for moderate pain or headache.     ALPRAZolam (XANAX) 0.25 MG tablet Take 0.25 mg by mouth daily as needed for anxiety.      ALPRAZolam (XANAX) 0.5 MG tablet 0.5 mg.     aspirin EC 81 MG tablet Take 81 mg by mouth daily.     BD PEN NEEDLE NANO U/F 32G X 4 MM MISC SMARTSIG:1 Each SUB-Q Daily     benazepril (LOTENSIN) 20 MG tablet Take 20 mg by mouth daily.     Blood Glucose Monitoring Suppl (ACCU-CHEK GUIDE) w/Device KIT      citalopram (CELEXA) 40 MG tablet Take 40 mg by mouth daily.     clopidogrel (PLAVIX) 75 MG tablet Take 1 tablet (75 mg total) by mouth daily. 90 tablet 3   clotrimazole-betamethasone (LOTRISONE) cream Apply 1 application topically daily as needed for rash.     DULoxetine (CYMBALTA) 60 MG capsule      ezetimibe (ZETIA) 10 MG tablet Take 10 mg by mouth daily.     gabapentin (NEURONTIN) 300 MG capsule Take 1 capsule (300 mg total) by mouth 2 (two) times daily. 90 capsule 3   glimepiride (AMARYL) 4 MG tablet Take 4 mg by mouth 2 (two) times daily.      JANUVIA 100 MG tablet Take 100 mg by mouth daily.     LANTUS SOLOSTAR 100 UNIT/ML Solostar Pen Inject 25 Units into the skin daily.     levothyroxine (SYNTHROID) 175 MCG tablet Take 175 mcg by mouth daily.  meloxicam (MOBIC) 15 MG tablet Take 15 mg by mouth daily.     metFORMIN (GLUCOPHAGE) 850 MG tablet Take 850 mg by mouth 2 (two) times daily.      metoprolol tartrate (LOPRESSOR) 25 MG tablet Take 25 mg by mouth 2 (two) times daily.      nitroGLYCERIN (NITROSTAT) 0.4 MG SL tablet 0.4 mg.     omeprazole (PRILOSEC) 40 MG capsule Take 40 mg by mouth daily.     rosuvastatin (CRESTOR) 10 MG tablet Take 10 mg by mouth daily.     Saccharomyces boulardii (PROBIOTIC) 250 MG CAPS Take 1 capsule by mouth 2 (two) times daily. 60 capsule 1   terbinafine (LAMISIL) 250 MG tablet Take 1 tablet (250 mg total) by mouth daily. 30 tablet 0   Tetrahydrozoline HCl (VISINE OP) Place 1 drop into  both eyes at bedtime as needed (irritation).     No current facility-administered medications on file prior to visit.    No Known Allergies  Objective: There were no vitals filed for this visit.  General: No acute distress, AAOx3  Right foot: Hallux amputation stump site well-healed, + partial thickness ulcer at right 2nd toe distal tuft that measures 0.1x0.1x0.1 same as before with decreasing edema, reduced redness, decreased warmth at the distal tuft of the right second toe, there is a plantar wound at the forefoot that appears to be healing very well at area of previous blister now 2 small wounds that measure 0.3 x 0.4 and 0.5 x 0.2 with granular base there is a central skin island separating the 2 wounds with no surrounding redness warmth swelling no active drainage, no malodor, capillary fill time <3 seconds in all remaining digits, status post right great toe amputation.  No pain with calf compression.  History of unstable ankle.   Assessment and Plan:  Problem List Items Addressed This Visit   None Visit Diagnoses     S/P foot surgery, right    -  Primary   Skin ulcer of toe of right foot, limited to breakdown of skin (HCC)       Right foot ulcer, limited to breakdown of skin (HCC)       Unstable ankle, right       Abnormality of gait            -Patient seen and evaluated -Cleansed wounds at right 2nd and surgical amputation site on right and mechanically debrided wounds to bleeding margins using a 15 blade without incident  -Applied dry gauze to the amputation stump site and silver nitrate to the right second toe and Betadine and Prisma to the plantar forefoot and dry sterile dressing and instructed patient to continue with daily dressings at home consisting of same daily with help from wife like previous who was present at today's visit -rx defender cam boot to help further offload right foot since patient is struggling with stability and cannot safely use surgical shoe or  cam boot -Patient also met with [facility] Edelman today and was measured for diabetic shoes with custom insoles of which once we get the wound healed we will plan to transition the patient to especially since he is an amputee - Advised patient to go to the ER or return to office if the wound worsens or if constitutional symptoms are present. -Return to office as scheduled for continued wound care or sooner if problems or issues arise.  Landis Martins, DPM

## 2021-02-21 DIAGNOSIS — M79641 Pain in right hand: Secondary | ICD-10-CM | POA: Diagnosis not present

## 2021-02-21 DIAGNOSIS — N529 Male erectile dysfunction, unspecified: Secondary | ICD-10-CM | POA: Diagnosis not present

## 2021-02-21 DIAGNOSIS — F3341 Major depressive disorder, recurrent, in partial remission: Secondary | ICD-10-CM | POA: Diagnosis not present

## 2021-02-21 DIAGNOSIS — E1169 Type 2 diabetes mellitus with other specified complication: Secondary | ICD-10-CM | POA: Diagnosis not present

## 2021-02-21 DIAGNOSIS — N401 Enlarged prostate with lower urinary tract symptoms: Secondary | ICD-10-CM | POA: Diagnosis not present

## 2021-02-21 DIAGNOSIS — Z89411 Acquired absence of right great toe: Secondary | ICD-10-CM | POA: Diagnosis not present

## 2021-02-21 DIAGNOSIS — M79642 Pain in left hand: Secondary | ICD-10-CM | POA: Diagnosis not present

## 2021-02-21 DIAGNOSIS — B079 Viral wart, unspecified: Secondary | ICD-10-CM | POA: Diagnosis not present

## 2021-02-22 ENCOUNTER — Encounter: Payer: Self-pay | Admitting: Sports Medicine

## 2021-02-22 ENCOUNTER — Ambulatory Visit: Payer: Medicare HMO | Admitting: Sports Medicine

## 2021-02-22 DIAGNOSIS — Z89411 Acquired absence of right great toe: Secondary | ICD-10-CM

## 2021-02-22 DIAGNOSIS — L97511 Non-pressure chronic ulcer of other part of right foot limited to breakdown of skin: Secondary | ICD-10-CM

## 2021-02-22 DIAGNOSIS — M2042 Other hammer toe(s) (acquired), left foot: Secondary | ICD-10-CM

## 2021-02-22 DIAGNOSIS — M2041 Other hammer toe(s) (acquired), right foot: Secondary | ICD-10-CM

## 2021-02-22 DIAGNOSIS — M21619 Bunion of unspecified foot: Secondary | ICD-10-CM

## 2021-02-22 DIAGNOSIS — E1142 Type 2 diabetes mellitus with diabetic polyneuropathy: Secondary | ICD-10-CM

## 2021-02-22 DIAGNOSIS — Z9889 Other specified postprocedural states: Secondary | ICD-10-CM

## 2021-02-22 NOTE — Progress Notes (Addendum)
Subjective: Robert Santiago is a 71 y.o. male patient seen today in office for POV # 16 (DOS 10/18/2020), S/P right great toe amputation and removal of bone right fifth toe.  Patient is also here for wound care at right second toe and plantar forefoot.  Patient reports that ball is a little sore but otherwise doing much better not much draining and wife states that last night the second toe looked a little purple towards the in but otherwise is doing much better.  Denies nausea vomiting fever chills or any constitutional symptoms at this time.  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   Osteomyelitis of toe of right foot ([name]) 12/30/2020   Palpitations with regular cardiac rhythm 12/30/2020   Poorly controlled diabetes mellitus ([name]) 12/30/2020   Diabetes mellitus without complication ([name])    Acquired hypothyroidism 03/19/2019   Atherosclerosis of autologous vein coronary artery bypass graft(s) with unstable angina pectoris ([name]) 03/19/2019   Encounter for screening for diabetes mellitus 03/19/2019   Status post cardiac catheterization 03/19/2019   Type 2 diabetes mellitus without complication ([name]) 65/99/3570   Coronary artery disease involving native coronary artery of native heart with angina pectoris ([name]) 03/19/2019   Coronary artery disease of native artery of native heart with stable angina pectoris ([name])    Hyperlipidemia    Essential hypertension    Progressive angina (HCC)-class III 12/19/2018   COVID-19 08/2018   Hx of CABG 08/23/2014   Multiple vessel coronary artery disease 08/21/2014    Current Outpatient Medications on File Prior to  Visit  Medication Sig Dispense Refill   acetaminophen (TYLENOL) 500 MG tablet Take 1,000 mg by mouth every 6 (six) hours as needed for moderate pain or headache.     ALPRAZolam (XANAX) 0.25 MG tablet Take 0.25 mg by mouth daily as needed for anxiety.      ALPRAZolam (XANAX) 0.5 MG tablet 0.5 mg.     aspirin EC 81 MG tablet Take 81 mg by mouth daily.     BD PEN NEEDLE NANO U/F 32G X 4 MM MISC SMARTSIG:1 Each SUB-Q Daily     benazepril (LOTENSIN) 20 MG tablet Take 20 mg by mouth daily.     Blood Glucose Monitoring Suppl (ACCU-CHEK GUIDE) w/Device KIT      citalopram (CELEXA) 40 MG tablet Take 40 mg by mouth daily.     clopidogrel (PLAVIX) 75 MG tablet Take 1 tablet (75 mg total) by mouth daily. 90 tablet 3   clotrimazole-betamethasone (LOTRISONE) cream Apply 1 application topically daily as needed for rash.     DULoxetine (CYMBALTA) 60 MG capsule      ezetimibe (ZETIA) 10 MG tablet Take 10 mg by mouth daily.     gabapentin (NEURONTIN) 300 MG capsule Take 1 capsule (300 mg total) by mouth 2 (two) times daily. 90 capsule 3   glimepiride (AMARYL) 4 MG tablet Take 4 mg by mouth 2 (two) times daily.      JANUVIA 100 MG tablet Take 100 mg by mouth daily.     LANTUS SOLOSTAR 100 UNIT/ML Solostar Pen Inject 25 Units into the skin daily.     levothyroxine (SYNTHROID) 175  MCG tablet Take 175 mcg by mouth daily.      meloxicam (MOBIC) 15 MG tablet Take 15 mg by mouth daily.     metFORMIN (GLUCOPHAGE) 1000 MG tablet Take 1,000 mg by mouth 2 (two) times daily.     metFORMIN (GLUCOPHAGE) 850 MG tablet Take 850 mg by mouth 2 (two) times daily.      metoprolol tartrate (LOPRESSOR) 25 MG tablet Take 25 mg by mouth 2 (two) times daily.      nitroGLYCERIN (NITROSTAT) 0.4 MG SL tablet 0.4 mg.     omeprazole (PRILOSEC) 40 MG capsule Take 40 mg by mouth daily.     rosuvastatin (CRESTOR) 10 MG tablet Take 10 mg by mouth daily.     Saccharomyces boulardii (PROBIOTIC) 250 MG CAPS Take 1 capsule by mouth 2 (two) times  daily. 60 capsule 1   tamsulosin (FLOMAX) 0.4 MG CAPS capsule Take 0.4 mg by mouth daily.     terbinafine (LAMISIL) 250 MG tablet Take 1 tablet (250 mg total) by mouth daily. 30 tablet 0   Tetrahydrozoline HCl (VISINE OP) Place 1 drop into both eyes at bedtime as needed (irritation).     No current facility-administered medications on file prior to visit.    No Known Allergies  Objective: There were no vitals filed for this visit.  General: No acute distress, AAOx3  Right foot: Hallux amputation stump site well-healed, previous wound at distal tuft of the right second toe now HEALED with decreased edema, minimal redness, decreased warmth at the distal tuft of the right second toe, there is a plantar wound at the forefoot that appears to be healing very well at area of previous blister, 2 small wounds that measure 0. 1 x 0. 1 and 0.3x 0.2 with granular base there is a central skin island separating the 2 wounds with no surrounding redness warmth swelling no active drainage, no malodor, capillary fill time <3 seconds in all remaining digits, status post right great toe amputation.  No pain with calf compression.  History of unstable ankle.   Assessment and Plan:  Problem List Items Addressed This Visit   None Visit Diagnoses     Skin ulcer of toe of right foot, limited to breakdown of skin ([place])    -  Primary   S/P foot surgery, right       Diabetic polyneuropathy associated with type 2 diabetes mellitus (HCC)       Relevant Medications   metFORMIN (GLUCOPHAGE) 1000 MG tablet   Acquired absence of right great toe (HCC)       Hammer toes of both feet       Bunion            -Patient seen and evaluated -Cleansed wounds at right plantar forefoot and mechanically debrided wounds to bleeding margins using a 15 blade without incident  -Applied dry gauze to the amputation stump and to right second toe and Betadine and Prisma to the plantar forefoot and dry sterile dressing and instructed  patient to continue with daily dressings at home consisting of same daily with help from wife  -Awaiting defender cam boot to help further offload right foot since patient is struggling with stability and cannot safely use surgical shoe or cam boot - Advised patient to go to the ER or return to office if the wound worsens or if constitutional symptoms are present. -Return to office as scheduled for continued wound care or sooner if problems or issues arise.  Landis Martins,  DPM

## 2021-03-08 ENCOUNTER — Ambulatory Visit: Payer: Medicare HMO | Admitting: Sports Medicine

## 2021-03-08 ENCOUNTER — Encounter: Payer: Self-pay | Admitting: Sports Medicine

## 2021-03-08 DIAGNOSIS — E1142 Type 2 diabetes mellitus with diabetic polyneuropathy: Secondary | ICD-10-CM

## 2021-03-08 DIAGNOSIS — Z9889 Other specified postprocedural states: Secondary | ICD-10-CM

## 2021-03-08 DIAGNOSIS — L97511 Non-pressure chronic ulcer of other part of right foot limited to breakdown of skin: Secondary | ICD-10-CM | POA: Diagnosis not present

## 2021-03-08 DIAGNOSIS — R269 Unspecified abnormalities of gait and mobility: Secondary | ICD-10-CM

## 2021-03-08 DIAGNOSIS — T148XXA Other injury of unspecified body region, initial encounter: Secondary | ICD-10-CM

## 2021-03-08 NOTE — Progress Notes (Signed)
Subjective: Robert Santiago is a 71 y.o. male patient seen today in office for POV # 17 (DOS 10/18/2020), S/P right great toe amputation and removal of bone right fifth toe.  Patient is also here for wound care at right plantar forefoot and states that he has a new problem has a scrape at the top of the right 2nd toe after crawling around on the floor. Denies nausea vomiting fever chills or any constitutional symptoms at this time.  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   Osteomyelitis of toe of right foot ([place]) 12/30/2020   Palpitations with regular cardiac rhythm 12/30/2020   Poorly controlled diabetes mellitus ([place]) 12/30/2020   Diabetes mellitus without complication ([place])    Acquired hypothyroidism 03/19/2019   Atherosclerosis of autologous vein coronary artery bypass graft(s) with unstable angina pectoris ([place]) 03/19/2019   Encounter for screening for diabetes mellitus 03/19/2019   Status post cardiac catheterization 03/19/2019   Type 2 diabetes mellitus without complication ([place]) 13/08/6576   Coronary artery disease involving native coronary artery of native heart with angina pectoris ([place]) 03/19/2019   Coronary artery disease of native artery of native heart with stable angina pectoris ([place])    Hyperlipidemia    Essential hypertension    Progressive angina (HCC)-class III 12/19/2018   COVID-19 08/2018   Hx of CABG 08/23/2014   Multiple vessel coronary artery disease 08/21/2014    Current Outpatient Medications on File Prior to Visit  Medication Sig Dispense Refill   acetaminophen (TYLENOL) 500 MG tablet Take 1,000 mg by mouth every 6 (six)  hours as needed for moderate pain or headache.     ALPRAZolam (XANAX) 0.25 MG tablet Take 0.25 mg by mouth daily as needed for anxiety.      ALPRAZolam (XANAX) 0.5 MG tablet 0.5 mg.     aspirin EC 81 MG tablet Take 81 mg by mouth daily.     BD PEN NEEDLE NANO U/F 32G X 4 MM MISC SMARTSIG:1 Each SUB-Q Daily     benazepril (LOTENSIN) 20 MG tablet Take 20 mg by mouth daily.     Blood Glucose Monitoring Suppl (ACCU-CHEK GUIDE) w/Device KIT      citalopram (CELEXA) 40 MG tablet Take 40 mg by mouth daily.     clopidogrel (PLAVIX) 75 MG tablet Take 1 tablet (75 mg total) by mouth daily. 90 tablet 3   clotrimazole-betamethasone (LOTRISONE) cream Apply 1 application topically daily as needed for rash.     DULoxetine (CYMBALTA) 60 MG capsule      ezetimibe (ZETIA) 10 MG tablet Take 10 mg by mouth daily.     gabapentin (NEURONTIN) 300 MG capsule Take 1 capsule (300 mg total) by mouth 2 (two) times daily. 90 capsule 3   glimepiride (AMARYL) 4 MG tablet Take 4 mg by mouth 2 (two) times daily.      JANUVIA 100 MG tablet Take 100 mg by mouth daily.     LANTUS SOLOSTAR 100 UNIT/ML Solostar Pen Inject 25 Units into the skin daily.     levothyroxine (SYNTHROID) 175 MCG tablet Take 175 mcg by mouth daily.      meloxicam (MOBIC) 15 MG tablet  Take 15 mg by mouth daily.     metFORMIN (GLUCOPHAGE) 1000 MG tablet Take 1,000 mg by mouth 2 (two) times daily.     metFORMIN (GLUCOPHAGE) 850 MG tablet Take 850 mg by mouth 2 (two) times daily.      metoprolol tartrate (LOPRESSOR) 25 MG tablet Take 25 mg by mouth 2 (two) times daily.      nitroGLYCERIN (NITROSTAT) 0.4 MG SL tablet 0.4 mg.     omeprazole (PRILOSEC) 40 MG capsule Take 40 mg by mouth daily.     rosuvastatin (CRESTOR) 10 MG tablet Take 10 mg by mouth daily.     Saccharomyces boulardii (PROBIOTIC) 250 MG CAPS Take 1 capsule by mouth 2 (two) times daily. 60 capsule 1   tamsulosin (FLOMAX) 0.4 MG CAPS capsule Take 0.4 mg by mouth daily.     terbinafine  (LAMISIL) 250 MG tablet Take 1 tablet (250 mg total) by mouth daily. 30 tablet 0   Tetrahydrozoline HCl (VISINE OP) Place 1 drop into both eyes at bedtime as needed (irritation).     No current facility-administered medications on file prior to visit.    No Known Allergies  Objective: There were no vitals filed for this visit.  General: No acute distress, AAOx3  Right foot: Hallux amputation stump site  and right 2nd toe distal tip is HEALED with decreased edema at the 2nd toe but now a small scrape at the dorsal aspect of the 2nd toe at the PIPJ, minimal redness, decreased warmth there is a plantar wound at the forefoot that appears to be healing very well at area of previous blister, 2 small wounds that measure 0. 1 x 0. 1 and 0.1x 0.1 with granular base there is a central skin island separating the 2 wounds with mild surrounding keratosis and no redness warmth swelling no active drainage, no malodor, capillary fill time <3 seconds in all remaining digits, status post right great toe amputation.  No pain with calf compression.  History of unstable ankle.   Assessment and Plan:  Problem List Items Addressed This Visit   None Visit Diagnoses     Skin ulcer of toe of right foot, limited to breakdown of skin (HCC)    -  Primary   Abrasion       S/P foot surgery, right       Diabetic polyneuropathy associated with type 2 diabetes mellitus (HCC)       Abnormality of gait            -Patient seen and evaluated -Cleansed wounds at right plantar forefoot and mechanically debrided wounds to bleeding margins using a 15 blade without incident  -Applied dry gauze to the amputation stump and to right second toe and Betadine to the plantar forefoot  and dorsal 2nd toe and dry sterile dressing and instructed patient to continue with daily dressings at home consisting of same daily with help from wife like previous -Awaiting defender cam boot to help further offload right foot meanwhile to use  dancer pads - Advised patient to go to the ER or return to office if the wound worsens or if constitutional symptoms are present. -Return to office in 2 weeks for continued wound care or sooner if problems or issues arise.  Landis Martins, DPM

## 2021-03-14 ENCOUNTER — Other Ambulatory Visit: Payer: Self-pay | Admitting: Sports Medicine

## 2021-03-14 ENCOUNTER — Telehealth: Payer: Self-pay | Admitting: *Deleted

## 2021-03-14 MED ORDER — CIPROFLOXACIN HCL 500 MG PO TABS: 500.0000 mg | ORAL_TABLET | Freq: Two times a day (BID) | ORAL | 0 refills | Status: AC

## 2021-03-14 NOTE — Telephone Encounter (Signed)
-----   Message from Landis Martins, [place] sent at 03/14/2021  2:33 PM EST ----- Have wife to apply betadine in between the toes each day with his dressing change and meanwhile I have sent antibiotics to his pharmacy Thanks ----- Message ----- From: Viviana Simpler, [facility] Sent: 03/14/2021   2:23 PM EST To: Landis Martins, DPM  Patient called and stated that the left foot in between the 4th and 5th toes there was a blister and the patient popped it open and stunk and patient wanted to know should he come in or get an antibiotic. Lattie Haw

## 2021-03-14 NOTE — Progress Notes (Signed)
Sent Cipro for blister until patient can be send and recommended wife to apply betadine with dressing changes daily  Thanks Dr. Chauncey Cruel

## 2021-03-14 NOTE — Telephone Encounter (Signed)
Called and spoke with the wife and relayed the message per Dr Cannon Kettle. Lattie Haw

## 2021-03-21 ENCOUNTER — Telehealth: Payer: Self-pay | Admitting: *Deleted

## 2021-03-21 ENCOUNTER — Ambulatory Visit: Payer: Medicare HMO | Admitting: Sports Medicine

## 2021-03-21 DIAGNOSIS — Z1211 Encounter for screening for malignant neoplasm of colon: Secondary | ICD-10-CM | POA: Diagnosis not present

## 2021-03-21 DIAGNOSIS — L97529 Non-pressure chronic ulcer of other part of left foot with unspecified severity: Secondary | ICD-10-CM | POA: Diagnosis not present

## 2021-03-21 DIAGNOSIS — M2041 Other hammer toe(s) (acquired), right foot: Secondary | ICD-10-CM | POA: Diagnosis not present

## 2021-03-21 DIAGNOSIS — E1169 Type 2 diabetes mellitus with other specified complication: Secondary | ICD-10-CM | POA: Diagnosis not present

## 2021-03-21 DIAGNOSIS — Z89411 Acquired absence of right great toe: Secondary | ICD-10-CM | POA: Diagnosis not present

## 2021-03-21 DIAGNOSIS — E11621 Type 2 diabetes mellitus with foot ulcer: Secondary | ICD-10-CM | POA: Diagnosis not present

## 2021-03-21 DIAGNOSIS — Z6838 Body mass index (BMI) 38.0-38.9, adult: Secondary | ICD-10-CM | POA: Diagnosis not present

## 2021-03-21 NOTE — Telephone Encounter (Signed)
Called and spoke with the patient and relayed the message per Dr Stover. Daniella Dewberry °

## 2021-03-21 NOTE — Telephone Encounter (Signed)
-----   Message from Landis Martins, [place] sent at 03/21/2021  6:46 AM EST ----- ?He can take the Amoxicillin instead ?----- Message ----- ?From: Viviana Simpler, PMAC ?Sent: 03/20/2021   4:46 PM EST ?To: Landis Martins, DPM ? ?Patient called and stated that the antibiotic that was called in for the patient made him feel bad and he stopped taken it on Friday and does a prescription of amoxicillin that he had before he had the foot surgery that he got from the hospital and was wondering if he could take that instead. Lattie Haw ? ? ?

## 2021-03-22 DIAGNOSIS — F3341 Major depressive disorder, recurrent, in partial remission: Secondary | ICD-10-CM | POA: Diagnosis not present

## 2021-03-22 DIAGNOSIS — I10 Essential (primary) hypertension: Secondary | ICD-10-CM | POA: Diagnosis not present

## 2021-03-22 DIAGNOSIS — Z6838 Body mass index (BMI) 38.0-38.9, adult: Secondary | ICD-10-CM | POA: Diagnosis not present

## 2021-03-22 DIAGNOSIS — E785 Hyperlipidemia, unspecified: Secondary | ICD-10-CM | POA: Diagnosis not present

## 2021-03-22 DIAGNOSIS — E1169 Type 2 diabetes mellitus with other specified complication: Secondary | ICD-10-CM | POA: Diagnosis not present

## 2021-03-22 DIAGNOSIS — K219 Gastro-esophageal reflux disease without esophagitis: Secondary | ICD-10-CM | POA: Diagnosis not present

## 2021-03-22 DIAGNOSIS — E039 Hypothyroidism, unspecified: Secondary | ICD-10-CM | POA: Diagnosis not present

## 2021-03-22 DIAGNOSIS — I2510 Atherosclerotic heart disease of native coronary artery without angina pectoris: Secondary | ICD-10-CM | POA: Diagnosis not present

## 2021-03-23 ENCOUNTER — Telehealth: Payer: Self-pay

## 2021-03-23 DIAGNOSIS — Z1211 Encounter for screening for malignant neoplasm of colon: Secondary | ICD-10-CM | POA: Diagnosis not present

## 2021-03-23 DIAGNOSIS — Z1212 Encounter for screening for malignant neoplasm of rectum: Secondary | ICD-10-CM | POA: Diagnosis not present

## 2021-03-23 NOTE — Telephone Encounter (Signed)
Casts sent to [facility] [employer] ?

## 2021-03-31 ENCOUNTER — Ambulatory Visit: Payer: Medicare HMO | Admitting: Sports Medicine

## 2021-04-05 ENCOUNTER — Telehealth: Payer: Self-pay | Admitting: *Deleted

## 2021-04-05 ENCOUNTER — Other Ambulatory Visit: Payer: Self-pay | Admitting: Sports Medicine

## 2021-04-05 MED ORDER — AMOXICILLIN-POT CLAVULANATE 875-125 MG PO TABS: 1.0000 | ORAL_TABLET | Freq: Two times a day (BID) | ORAL | 0 refills | Status: DC

## 2021-04-05 NOTE — Telephone Encounter (Signed)
Called and spoke with the patient's wife Jackelyn Poling) and relayed the message per Dr Cannon Kettle. Lattie Haw ?

## 2021-04-05 NOTE — Progress Notes (Signed)
Refilled amoxicillin for patient to take for infection or drainage in between the toes until he can to office to be evaluated [place] ?

## 2021-04-05 NOTE — Telephone Encounter (Signed)
-----   Message from Landis Martins, [place] sent at 04/05/2021  1:43 PM EDT ----- ?Refill sent ?----- Message ----- ?From: Viviana Simpler, PMAC ?Sent: 04/05/2021   1:11 PM EDT ?To: Landis Martins, DPM ? ?Patient is out of town at [place] and the left foot in between the toes looks infected and took the last of the amoxicillin and wants to know can you refill it or what should patient do and will not be back for two more days. Lattie Haw ? ? ?

## 2021-04-12 ENCOUNTER — Encounter: Payer: Self-pay | Admitting: Sports Medicine

## 2021-04-12 ENCOUNTER — Ambulatory Visit (INDEPENDENT_AMBULATORY_CARE_PROVIDER_SITE_OTHER): Payer: Medicare HMO | Admitting: Sports Medicine

## 2021-04-12 ENCOUNTER — Ambulatory Visit (INDEPENDENT_AMBULATORY_CARE_PROVIDER_SITE_OTHER): Payer: Medicare HMO

## 2021-04-12 VITALS — Temp 97.0°F

## 2021-04-12 DIAGNOSIS — Z9889 Other specified postprocedural states: Secondary | ICD-10-CM | POA: Diagnosis not present

## 2021-04-12 DIAGNOSIS — L03119 Cellulitis of unspecified part of limb: Secondary | ICD-10-CM | POA: Diagnosis not present

## 2021-04-12 DIAGNOSIS — L97529 Non-pressure chronic ulcer of other part of left foot with unspecified severity: Secondary | ICD-10-CM | POA: Diagnosis not present

## 2021-04-12 DIAGNOSIS — M79675 Pain in left toe(s): Secondary | ICD-10-CM | POA: Diagnosis not present

## 2021-04-12 DIAGNOSIS — L97524 Non-pressure chronic ulcer of other part of left foot with necrosis of bone: Secondary | ICD-10-CM

## 2021-04-12 DIAGNOSIS — E1142 Type 2 diabetes mellitus with diabetic polyneuropathy: Secondary | ICD-10-CM | POA: Diagnosis not present

## 2021-04-12 NOTE — [place] Notes (Signed)
Subjective: ?Robert Santiago is a 71 y.o. male patient seen today in office for POV # 18 (DOS 10/18/2020), S/P right great toe amputation and removal of bone right fifth toe.  Patient is also here for wound care now at left 4th toe; [place] the [place] and the left 4th toe started to drain and the foot turned red; states that the second antibiotic of Augmentin seems to be helping some. Denies nausea vomiting fever chills or any constitutional symptoms at this time. ? ?Fasting blood sugar not recorded ? ?Temperature 97 ? ?Patient Active Problem List  ? Diagnosis Date Noted  ? Acute coronary syndrome ([place]) 12/30/2020  ? Anxiety 12/30/2020  ? Cellulitis of foot 12/30/2020  ? Chest pain 12/30/2020  ? Closed fracture of cuneiform bone of foot 12/30/2020  ? Diabetic ulcer of toe ([place]) 12/30/2020  ? Dyslipidemia 12/30/2020  ? GERD (gastroesophageal reflux disease) 12/30/2020  ? 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 Osteomyelitis of toe of right foot ([place]) 12/30/2020  ? Palpitations with regular cardiac rhythm 12/30/2020  ? Poorly controlled diabetes mellitus ([place]) 12/30/2020  ? Diabetes mellitus without complication ([place])   ? Acquired hypothyroidism 03/19/2019  ? Atherosclerosis of autologous vein coronary artery bypass graft(s) with unstable angina pectoris ([place]) 03/19/2019  ? Encounter for screening for diabetes mellitus 03/19/2019  ? Status post cardiac catheterization 03/19/2019  ? Type 2 diabetes mellitus without complication ([place]) 29/51/8841  ? Coronary artery disease involving native coronary artery of native heart with angina pectoris ([place]) 03/19/2019  ? Coronary artery disease of native artery of native heart with stable angina pectoris ([place])   ? Hyperlipidemia   ? Essential hypertension   ? Progressive angina (HCC)-class III 12/19/2018  ? COVID-19 08/2018  ? Hx of CABG 08/23/2014  ? Multiple vessel coronary artery disease 08/21/2014  ? ? ?Current Outpatient Medications on File Prior to Visit  ?Medication  Sig Dispense Refill  ? acetaminophen (TYLENOL) 500 MG tablet Take 1,000 mg by mouth every 6 (six) hours as needed for moderate pain or headache.    ? ALPRAZolam (XANAX) 0.5 MG tablet 0.5 mg.    ? amoxicillin-clavulanate (AUGMENTIN) 875-125 MG tablet Take 1 tablet by mouth 2 (two) times daily. 28 tablet 0  ? aspirin EC 81 MG tablet Take 81 mg by mouth daily.    ? BD PEN NEEDLE NANO U/F 32G X 4 MM MISC SMARTSIG:1 Each SUB-Q Daily    ? benazepril (LOTENSIN) 20 MG tablet Take 20 mg by mouth daily.    ? Blood Glucose Monitoring Suppl (ACCU-CHEK GUIDE) w/Device KIT     ? busPIRone (BUSPAR) 5 MG tablet Take 5 mg by mouth 2 (two) times daily.    ? citalopram (CELEXA) 40 MG tablet Take 40 mg by mouth daily.    ? clopidogrel (PLAVIX) 75 MG tablet Take 1 tablet (75 mg total) by mouth daily. 90 tablet 3  ? clotrimazole-betamethasone (LOTRISONE) cream Apply 1 application topically daily as needed for rash.    ? DULoxetine (CYMBALTA) 60 MG capsule     ? ezetimibe (ZETIA) 10 MG tablet Take 10 mg by mouth daily.    ? gabapentin (NEURONTIN) 300 MG capsule Take 1 capsule (300 mg total) by mouth 2 (two) times daily. 90 capsule 3  ? glimepiride (AMARYL) 4 MG tablet Take 4 mg by mouth 2 (two) times daily.     ? JANUVIA 100 MG tablet Take 100 mg by mouth daily.    ? LANTUS SOLOSTAR 100 UNIT/ML  Solostar Pen Inject 25 Units into the skin daily.    ? levothyroxine (SYNTHROID) 175 MCG tablet Take 175 mcg by mouth daily.     ? meloxicam (MOBIC) 15 MG tablet Take 15 mg by mouth daily.    ? metFORMIN (GLUCOPHAGE) 1000 MG tablet Take 1,000 mg by mouth 2 (two) times daily.    ? metFORMIN (GLUCOPHAGE) 850 MG tablet Take 850 mg by mouth 2 (two) times daily.     ? metoprolol tartrate (LOPRESSOR) 25 MG tablet Take 25 mg by mouth 2 (two) times daily.     ? nitroGLYCERIN (NITROSTAT) 0.4 MG SL tablet 0.4 mg.    ? omeprazole (PRILOSEC) 40 MG capsule Take 40 mg by mouth daily.    ? rosuvastatin (CRESTOR) 10 MG tablet Take 10 mg by mouth daily.    ?  Saccharomyces boulardii (PROBIOTIC) 250 MG CAPS Take 1 capsule by mouth 2 (two) times daily. 60 capsule 1  ? tamsulosin (FLOMAX) 0.4 MG CAPS capsule Take 0.4 mg by mouth daily.    ? terbinafine (LAMISIL) 250 MG tablet Take 1 tablet (250 mg total) by mouth daily. 30 tablet 0  ? Tetrahydrozoline HCl (VISINE OP) Place 1 drop into both eyes at bedtime as needed (irritation).    ? ?No current facility-administered medications on file prior to visit.  ? ? ?No Known Allergies ? ?Objective: ?There were no vitals filed for this visit. ? ?General: No acute distress, AAOx3  ?Right foot: Previous wounds now healed preulcerative callus plantar left forefoot ? ?Left foot: There is a full-thickness ulceration at the lateral aspect of the fourth toe at the fourth webspace this wound measures 1 cm x 0.5 cm down to the level of bone that is exposed at the interphalangeal joint and fatty tissue with surrounding soft tissue maceration and significant erythema to the entire left fourth toe extending to the level of the metatarsophalangeal joint and dorsal foot on the left.  There is significant soft tissue swelling, mild edema, mild warmth, no malodor, vascular status intact.  Protective sensation severely diminished.  Minimal pain to palpation to left fourth toe. ? ?X-ray left foot hammertoe deformity with questionable osteomyelitis at the left fourth toe however clinically there is osteomyelitis since there is exposed bone ? ?Assessment and Plan:  ?Problem List Items Addressed This Visit   ? ?  ? Other  ? Cellulitis of foot  ? ?Other Visit Diagnoses   ? ? Ulcer of left foot with necrosis of bone ([place])    -  Primary  ? Relevant Orders  ? DG Foot Complete Left (Completed)  ? WOUND CULTURE  ? Toe pain, left      ? S/P foot surgery, right      ? Diabetic polyneuropathy associated with type 2 diabetes mellitus ([place])      ? Relevant Medications  ? busPIRone (BUSPAR) 5 MG tablet  ? ?  ?  ? ?-Patient seen and evaluated ?-X-rays reviewed left  foot ?-Discussed with patient extent of cellulitis left fourth toe ulcer and clinical osteomyelitis ?- Excisionally dedbrided ulceration at left fourth toe to healthy bleeding borders removing nonviable tissue using a sterile chisel blade. Wound measures post debridement as above.  Wound was debrided to the level of the dermis with viable wound base exposed to promote healing. Hemostasis was achieved with manuel pressure. Patient tolerated procedure well without any discomfort or anesthesia necessary for this wound debridement.  ?-Wound culture obtained meanwhile advised patient to continue with Augmentin if he  runs out before we call him with the culture results call office for refill ?-Applied Iodosorb and dry sterile dressing and instructed patient to continue with daily dressings at home consisting of same daily to the left foot ?-Right foot wounds are well-healed ?-Advised offloading padding to the right foot ?-Patient is awaiting diabetic shoes and insoles ?- Advised patient to go to the ER or return to office if the wound worsens or if constitutional symptoms are present. ?-Return to office in 2 weeks for continued wound care or sooner if problems or issues arise. ? ?Landis Martins, DPM ? ?

## 2021-04-13 ENCOUNTER — Telehealth: Payer: Self-pay

## 2021-04-13 NOTE — Telephone Encounter (Signed)
Shoes Ordered - [employer] [id number] ?

## 2021-04-14 ENCOUNTER — Ambulatory Visit: Payer: Medicare HMO | Admitting: Sports Medicine

## 2021-04-17 ENCOUNTER — Telehealth: Payer: Self-pay | Admitting: *Deleted

## 2021-04-17 ENCOUNTER — Other Ambulatory Visit: Payer: Self-pay | Admitting: Sports Medicine

## 2021-04-17 LAB — WOUND CULTURE: Organism ID, Bacteria: NONE SEEN

## 2021-04-17 MED ORDER — AMOXICILLIN-POT CLAVULANATE 875-125 MG PO TABS: 1.0000 | ORAL_TABLET | Freq: Two times a day (BID) | ORAL | 0 refills | Status: DC

## 2021-04-17 NOTE — Telephone Encounter (Signed)
Called and left a message for the patient's wife Jackelyn Poling and relayed the message that the culture came back positive and Dr Cannon Kettle sent over augmentin for the patient and to call the office if any concerns or questions. Lattie Haw ?

## 2021-04-17 NOTE — Telephone Encounter (Signed)
-----   Message from Landis Martins, [place] sent at 04/17/2021  4:04 PM EDT ----- ?Will you let patient know I refilled his Augmentin wound culture was + for Entercoccus ?Thanks ?Dr. Cannon Kettle ?

## 2021-04-17 NOTE — [place] Notes (Signed)
Refilled Augmentin for + wound culture ?

## 2021-04-24 ENCOUNTER — Telehealth: Payer: Self-pay | Admitting: *Deleted

## 2021-04-24 NOTE — Telephone Encounter (Signed)
-----   Message from Landis Martins, [place] sent at 04/24/2021 11:15 AM EDT ----- ?Antibiotics were refilled on 04/17/21. He will have enough to last until 05/01/21 ?Thanks ?Dr. Chauncey Cruel ?----- Message ----- ?From: Viviana Simpler, PMAC ?Sent: 04/20/2021   1:06 PM EDT ?To: Landis Martins, DPM ? ?Patient called and left a message stating that he had one more antibiotic and did not know if he needed another round of antibiotics. Lattie Haw ? ? ?

## 2021-04-24 NOTE — Telephone Encounter (Signed)
Called and spoke with the patient's wife Jackelyn Poling) and she stated that the did get the antibiotics and that patient was coming on Wednesday April 12th, to see Dr Cannon Kettle. Lattie Haw ?

## 2021-04-25 DIAGNOSIS — E1169 Type 2 diabetes mellitus with other specified complication: Secondary | ICD-10-CM | POA: Diagnosis not present

## 2021-04-25 DIAGNOSIS — F3341 Major depressive disorder, recurrent, in partial remission: Secondary | ICD-10-CM | POA: Diagnosis not present

## 2021-04-26 ENCOUNTER — Encounter: Payer: Self-pay | Admitting: Sports Medicine

## 2021-04-26 ENCOUNTER — Ambulatory Visit (INDEPENDENT_AMBULATORY_CARE_PROVIDER_SITE_OTHER): Payer: Medicare HMO | Admitting: Sports Medicine

## 2021-04-26 DIAGNOSIS — L03119 Cellulitis of unspecified part of limb: Secondary | ICD-10-CM | POA: Diagnosis not present

## 2021-04-26 DIAGNOSIS — L97524 Non-pressure chronic ulcer of other part of left foot with necrosis of bone: Secondary | ICD-10-CM

## 2021-04-26 DIAGNOSIS — M79675 Pain in left toe(s): Secondary | ICD-10-CM

## 2021-04-26 DIAGNOSIS — E1142 Type 2 diabetes mellitus with diabetic polyneuropathy: Secondary | ICD-10-CM

## 2021-04-26 DIAGNOSIS — L97511 Non-pressure chronic ulcer of other part of right foot limited to breakdown of skin: Secondary | ICD-10-CM

## 2021-04-26 NOTE — Addendum Note (Signed)
Addended by: [place] Mon R on: 04/26/2021 01:21 PM ? ? Modules accepted: Orders ? ?

## 2021-04-26 NOTE — [place] Notes (Signed)
Subjective: ?Robert Santiago is a 71 y.o. male patient seen today in office for POV # 19 (DOS 10/18/2020), S/P right great toe amputation and removal of bone right fifth toe.  Patient is also here for wound care at left 4th toe and now the ball of the right foot; states that the right foot has gotten a little bigger states that the previous offloading padding he could not tolerate it makes the bottom of the foot sore and it would not stay in place.  Patient reports that since he resumed taking his Augmentin antibiotic the color/redness is better and states that the drainage is less but still has some swelling.  Patient is assisted by wife this visit and denies any other constitutional symptoms at this time.. ? ?Fasting blood sugar not recorded ? ?Temperature 97.2 ? ?Patient Active Problem List  ? Diagnosis Date Noted  ? Acute coronary syndrome ([place]) 12/30/2020  ? Anxiety 12/30/2020  ? Cellulitis of foot 12/30/2020  ? Chest pain 12/30/2020  ? Closed fracture of cuneiform bone of foot 12/30/2020  ? Diabetic ulcer of toe ([place]) 12/30/2020  ? Dyslipidemia 12/30/2020  ? GERD (gastroesophageal reflux disease) 12/30/2020  ? 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 Osteomyelitis of toe of right foot ([place]) 12/30/2020  ? Palpitations with regular cardiac rhythm 12/30/2020  ? Poorly controlled diabetes mellitus ([place]) 12/30/2020  ? Diabetes mellitus without complication ([place])   ? Acquired hypothyroidism 03/19/2019  ? Atherosclerosis of autologous vein coronary artery bypass graft(s) with unstable angina pectoris ([place]) 03/19/2019  ? Encounter for screening for diabetes mellitus 03/19/2019  ? Status post cardiac catheterization 03/19/2019  ? Type 2 diabetes mellitus without complication ([place]) 63/78/5885  ? Coronary artery disease involving native coronary artery of native heart with angina pectoris ([place]) 03/19/2019  ? Coronary artery disease of native artery of native heart with stable angina pectoris ([place])   ? Hyperlipidemia   ?  Essential hypertension   ? Progressive angina (HCC)-class III 12/19/2018  ? COVID-19 08/2018  ? Hx of CABG 08/23/2014  ? Multiple vessel coronary artery disease 08/21/2014  ? ? ?Current Outpatient Medications on File Prior to Visit  ?Medication Sig Dispense Refill  ? acetaminophen (TYLENOL) 500 MG tablet Take 1,000 mg by mouth every 6 (six) hours as needed for moderate pain or headache.    ? ALPRAZolam (XANAX) 0.5 MG tablet 0.5 mg.    ? amoxicillin-clavulanate (AUGMENTIN) 875-125 MG tablet Take 1 tablet by mouth 2 (two) times daily. 28 tablet 0  ? aspirin EC 81 MG tablet Take 81 mg by mouth daily.    ? BD PEN NEEDLE NANO U/F 32G X 4 MM MISC SMARTSIG:1 Each SUB-Q Daily    ? benazepril (LOTENSIN) 20 MG tablet Take 20 mg by mouth daily.    ? Blood Glucose Monitoring Suppl (ACCU-CHEK GUIDE) w/Device KIT     ? busPIRone (BUSPAR) 5 MG tablet Take 5 mg by mouth 2 (two) times daily.    ? citalopram (CELEXA) 40 MG tablet Take 40 mg by mouth daily.    ? clopidogrel (PLAVIX) 75 MG tablet Take 1 tablet (75 mg total) by mouth daily. 90 tablet 3  ? clotrimazole-betamethasone (LOTRISONE) cream Apply 1 application topically daily as needed for rash.    ? DULoxetine (CYMBALTA) 60 MG capsule     ? ezetimibe (ZETIA) 10 MG tablet Take 10 mg by mouth daily.    ? gabapentin (NEURONTIN) 300 MG capsule Take 1 capsule (300 mg total) by mouth 2 (two) times daily.  90 capsule 3  ? glimepiride (AMARYL) 4 MG tablet Take 4 mg by mouth 2 (two) times daily.     ? JANUVIA 100 MG tablet Take 100 mg by mouth daily.    ? LANTUS SOLOSTAR 100 UNIT/ML Solostar Pen Inject 25 Units into the skin daily.    ? levothyroxine (SYNTHROID) 175 MCG tablet Take 175 mcg by mouth daily.     ? meloxicam (MOBIC) 15 MG tablet Take 15 mg by mouth daily.    ? metFORMIN (GLUCOPHAGE) 1000 MG tablet Take 1,000 mg by mouth 2 (two) times daily.    ? metFORMIN (GLUCOPHAGE) 850 MG tablet Take 850 mg by mouth 2 (two) times daily.     ? metoprolol tartrate (LOPRESSOR) 25 MG tablet  Take 25 mg by mouth 2 (two) times daily.     ? nitroGLYCERIN (NITROSTAT) 0.4 MG SL tablet 0.4 mg.    ? omeprazole (PRILOSEC) 40 MG capsule Take 40 mg by mouth daily.    ? rosuvastatin (CRESTOR) 10 MG tablet Take 10 mg by mouth daily.    ? Saccharomyces boulardii (PROBIOTIC) 250 MG CAPS Take 1 capsule by mouth 2 (two) times daily. 60 capsule 1  ? tamsulosin (FLOMAX) 0.4 MG CAPS capsule Take 0.4 mg by mouth daily.    ? terbinafine (LAMISIL) 250 MG tablet Take 1 tablet (250 mg total) by mouth daily. 30 tablet 0  ? Tetrahydrozoline HCl (VISINE OP) Place 1 drop into both eyes at bedtime as needed (irritation).    ? ?No current facility-administered medications on file prior to visit.  ? ? ?No Known Allergies ? ?Objective: ?There were no vitals filed for this visit. ? ?General: No acute distress, AAOx3  ?Right foot: Previous wounds now healed preulcerative callus plantar left forefoot that has now very ulcerated with a partial-thickness wound at the ball of the foot that measures 0.4 x 0.8 x 0.2 cm with a granular base and periwound maceration and minimal reactive keratosis present.  There is minimal edema, clear drainage, no malodor, no warmth, minimal localized redness. ? ?Left foot: There is a full-thickness ulceration at the lateral aspect of the fourth toe at the fourth webspace this wound measures 1 cm x 1 cm down to the level of bone that is exposed at the interphalangeal joint and fatty tissue with surrounding soft tissue maceration and decreased erythema, there is significant soft tissue swelling, mild edema, mild warmth, no malodor, vascular status intact.  Protective sensation severely diminished.  Minimal pain to palpation to left fourth toe. ? ? ?Assessment and Plan:  ?Problem List Items Addressed This Visit   ? ?  ? Other  ? Cellulitis of foot  ? ?Other Visit Diagnoses   ? ? Skin ulcer of toe of right foot, limited to breakdown of skin ([place])    -  Primary  ? Ulcer of left foot with necrosis of bone ([place])       ? Toe pain, left      ? Diabetic polyneuropathy associated with type 2 diabetes mellitus ([place])      ? ?  ?  ? ?-Patient seen and evaluated ?-Re- discussed with patient extent of left fourth toe infection with clinical osteomyelitis ?- Excisionally dedbrided ulceration at left fourth toe to healthy bleeding borders removing nonviable tissue and exposed bone using a sterile nail nipper.  Wound measures post debridement as above.  Wound was debrided to the level of the dermis with viable wound base exposed to promote healing. Hemostasis was achieved  with manuel pressure. Patient tolerated procedure well without any discomfort or anesthesia necessary for this wound debridement.  ?-Applied Prisma and dry dressing to the left fourth toe and instructed wife to do the same ?-Excisionally debrided ulceration at the ball of the right foot to healthy bleeding borders removing nonviable tissue using a sterile tissue nipper.  Wound measures postdebridement as above.  Wound was debrided to the level of the dermis with viable tissue exposed to promote healing hemostasis was achieved with manual pressure patient tolerated procedure well without discomfort or need for anesthesia for this debridement.  Wound culture obtained today from the ball of the right foot. ?-Applied Iodosorb and offloading padding with dry sterile dressing and instructed patient to continue with daily dressings at home consisting of same daily to the right foot ?-Advised patient to refrain from wearing nonsupportive shoes and to try wearing a [employer] shoe or a new balance tennis shoe until he can get his diabetic shoes and insoles which have been ordered for him ?-Patient to continue with Augmentin for osteomyelitis at left fourth toe and for the ulceration at the ball of the right foot until his wound cultures have resulted ?- Advised patient to go to the ER or return to office if the wound worsens or if constitutional symptoms are present. ?-Return to  office in 1.5 weeks for continued wound care or sooner if problems or issues arise. ? ?Landis Martins, DPM ? ?

## 2021-05-01 LAB — WOUND CULTURE

## 2021-05-02 ENCOUNTER — Other Ambulatory Visit: Payer: Self-pay | Admitting: Sports Medicine

## 2021-05-02 ENCOUNTER — Telehealth: Payer: Self-pay | Admitting: *Deleted

## 2021-05-02 DIAGNOSIS — M79604 Pain in right leg: Secondary | ICD-10-CM | POA: Diagnosis not present

## 2021-05-02 DIAGNOSIS — E11621 Type 2 diabetes mellitus with foot ulcer: Secondary | ICD-10-CM | POA: Diagnosis not present

## 2021-05-02 DIAGNOSIS — E1142 Type 2 diabetes mellitus with diabetic polyneuropathy: Secondary | ICD-10-CM | POA: Diagnosis not present

## 2021-05-02 DIAGNOSIS — L03115 Cellulitis of right lower limb: Secondary | ICD-10-CM | POA: Diagnosis not present

## 2021-05-02 DIAGNOSIS — E08621 Diabetes mellitus due to underlying condition with foot ulcer: Secondary | ICD-10-CM | POA: Diagnosis not present

## 2021-05-02 DIAGNOSIS — M7989 Other specified soft tissue disorders: Secondary | ICD-10-CM | POA: Diagnosis not present

## 2021-05-02 DIAGNOSIS — E669 Obesity, unspecified: Secondary | ICD-10-CM | POA: Diagnosis not present

## 2021-05-02 DIAGNOSIS — L97511 Non-pressure chronic ulcer of other part of right foot limited to breakdown of skin: Secondary | ICD-10-CM | POA: Diagnosis not present

## 2021-05-02 DIAGNOSIS — M86171 Other acute osteomyelitis, right ankle and foot: Secondary | ICD-10-CM | POA: Diagnosis not present

## 2021-05-02 DIAGNOSIS — R600 Localized edema: Secondary | ICD-10-CM | POA: Diagnosis not present

## 2021-05-02 DIAGNOSIS — E039 Hypothyroidism, unspecified: Secondary | ICD-10-CM | POA: Diagnosis not present

## 2021-05-02 MED ORDER — AMOXICILLIN-POT CLAVULANATE 875-125 MG PO TABS: 1.0000 | ORAL_TABLET | Freq: Two times a day (BID) | ORAL | 0 refills | Status: DC

## 2021-05-02 MED ORDER — GENTAMICIN SULFATE 0.1 % EX OINT: 1.0000 "application " | TOPICAL_OINTMENT | Freq: Every day | CUTANEOUS | 0 refills | Status: DC

## 2021-05-02 NOTE — Telephone Encounter (Signed)
-----   Message from Landis Martins, [place] sent at 05/02/2021 12:07 PM EDT ----- ?I have sent a refill of Augmentin antibiotic to his pharmacy to be safe to help with infection until I recheck him back on next week.  I also sent an antibiotic ointment for him to apply to the wound and then cover with silver alginate and dry dressing once daily.  His wife should continue to help with this wound care. ?----- Message ----- ?From: Viviana Simpler, PMAC ?Sent: 05/02/2021   9:01 AM EDT ?To: Landis Martins, DPM ? ?Patient called and stated that he has 2 pills left of the antibiotics and wanted to know if he needs another round and to let you know. Lattie Haw ? ? ?

## 2021-05-02 NOTE — Telephone Encounter (Signed)
Called and spoke with the patient's wife debbie and relayed the message per Dr Cannon Kettle and patient's wife stated that the right foot was hurting and was swollen up to the knee and was red and had heat in it and per Dr Cannon Kettle needs to go the emergency room and get elevated by them and may need IV antibiotics. Lattie Haw ?

## 2021-05-02 NOTE — [place] Notes (Signed)
Refilled Augmentin and also order gentamicin ointment for patient to use for wound care ?

## 2021-05-03 DIAGNOSIS — E1142 Type 2 diabetes mellitus with diabetic polyneuropathy: Secondary | ICD-10-CM | POA: Diagnosis not present

## 2021-05-03 DIAGNOSIS — E669 Obesity, unspecified: Secondary | ICD-10-CM | POA: Diagnosis not present

## 2021-05-03 DIAGNOSIS — E039 Hypothyroidism, unspecified: Secondary | ICD-10-CM | POA: Diagnosis not present

## 2021-05-04 ENCOUNTER — Inpatient Hospital Stay (HOSPITAL_COMMUNITY): Payer: Medicare HMO

## 2021-05-04 ENCOUNTER — Inpatient Hospital Stay (HOSPITAL_COMMUNITY)
Admission: AD | Admit: 2021-05-04 | Discharge: 2021-05-07 | DRG: 617 | Disposition: A | Discharge status: Home / Self Care | Payer: Medicare HMO | Source: Other Acute Inpatient Hospital | Attending: Internal Medicine | Admitting: Internal Medicine

## 2021-05-04 DIAGNOSIS — L03115 Cellulitis of right lower limb: Secondary | ICD-10-CM | POA: Diagnosis not present

## 2021-05-04 DIAGNOSIS — M869 Osteomyelitis, unspecified: Secondary | ICD-10-CM | POA: Diagnosis not present

## 2021-05-04 DIAGNOSIS — M7732 Calcaneal spur, left foot: Secondary | ICD-10-CM | POA: Diagnosis not present

## 2021-05-04 DIAGNOSIS — Z89431 Acquired absence of right foot: Secondary | ICD-10-CM | POA: Diagnosis not present

## 2021-05-04 DIAGNOSIS — Z825 Family history of asthma and other chronic lower respiratory diseases: Secondary | ICD-10-CM | POA: Diagnosis not present

## 2021-05-04 DIAGNOSIS — Z7982 Long term (current) use of aspirin: Secondary | ICD-10-CM

## 2021-05-04 DIAGNOSIS — Z87891 Personal history of nicotine dependence: Secondary | ICD-10-CM | POA: Diagnosis not present

## 2021-05-04 DIAGNOSIS — D638 Anemia in other chronic diseases classified elsewhere: Secondary | ICD-10-CM | POA: Diagnosis not present

## 2021-05-04 DIAGNOSIS — R600 Localized edema: Secondary | ICD-10-CM | POA: Diagnosis not present

## 2021-05-04 DIAGNOSIS — Z8616 Personal history of COVID-19: Secondary | ICD-10-CM | POA: Diagnosis not present

## 2021-05-04 DIAGNOSIS — E11621 Type 2 diabetes mellitus with foot ulcer: Secondary | ICD-10-CM | POA: Diagnosis present

## 2021-05-04 DIAGNOSIS — Z89411 Acquired absence of right great toe: Secondary | ICD-10-CM

## 2021-05-04 DIAGNOSIS — Z794 Long term (current) use of insulin: Secondary | ICD-10-CM | POA: Diagnosis not present

## 2021-05-04 DIAGNOSIS — E039 Hypothyroidism, unspecified: Secondary | ICD-10-CM | POA: Diagnosis not present

## 2021-05-04 DIAGNOSIS — F419 Anxiety disorder, unspecified: Secondary | ICD-10-CM | POA: Diagnosis present

## 2021-05-04 DIAGNOSIS — Z8249 Family history of ischemic heart disease and other diseases of the circulatory system: Secondary | ICD-10-CM | POA: Diagnosis not present

## 2021-05-04 DIAGNOSIS — Z6837 Body mass index (BMI) 37.0-37.9, adult: Secondary | ICD-10-CM | POA: Diagnosis not present

## 2021-05-04 DIAGNOSIS — E782 Mixed hyperlipidemia: Secondary | ICD-10-CM | POA: Diagnosis present

## 2021-05-04 DIAGNOSIS — I25810 Atherosclerosis of coronary artery bypass graft(s) without angina pectoris: Secondary | ICD-10-CM | POA: Diagnosis present

## 2021-05-04 DIAGNOSIS — L0889 Other specified local infections of the skin and subcutaneous tissue: Secondary | ICD-10-CM | POA: Diagnosis not present

## 2021-05-04 DIAGNOSIS — M86171 Other acute osteomyelitis, right ankle and foot: Secondary | ICD-10-CM | POA: Diagnosis not present

## 2021-05-04 DIAGNOSIS — Z89421 Acquired absence of other right toe(s): Secondary | ICD-10-CM | POA: Diagnosis not present

## 2021-05-04 DIAGNOSIS — F32A Depression, unspecified: Secondary | ICD-10-CM | POA: Diagnosis not present

## 2021-05-04 DIAGNOSIS — L97519 Non-pressure chronic ulcer of other part of right foot with unspecified severity: Secondary | ICD-10-CM | POA: Diagnosis present

## 2021-05-04 DIAGNOSIS — Z7984 Long term (current) use of oral hypoglycemic drugs: Secondary | ICD-10-CM

## 2021-05-04 DIAGNOSIS — M868X7 Other osteomyelitis, ankle and foot: Secondary | ICD-10-CM | POA: Diagnosis not present

## 2021-05-04 DIAGNOSIS — S98132A Complete traumatic amputation of one left lesser toe, initial encounter: Secondary | ICD-10-CM | POA: Diagnosis not present

## 2021-05-04 DIAGNOSIS — Z79899 Other long term (current) drug therapy: Secondary | ICD-10-CM

## 2021-05-04 DIAGNOSIS — Z9889 Other specified postprocedural states: Secondary | ICD-10-CM | POA: Diagnosis not present

## 2021-05-04 DIAGNOSIS — I2510 Atherosclerotic heart disease of native coronary artery without angina pectoris: Secondary | ICD-10-CM | POA: Diagnosis present

## 2021-05-04 DIAGNOSIS — N400 Enlarged prostate without lower urinary tract symptoms: Secondary | ICD-10-CM

## 2021-05-04 DIAGNOSIS — I10 Essential (primary) hypertension: Secondary | ICD-10-CM | POA: Diagnosis not present

## 2021-05-04 DIAGNOSIS — M7989 Other specified soft tissue disorders: Secondary | ICD-10-CM | POA: Diagnosis not present

## 2021-05-04 DIAGNOSIS — M86172 Other acute osteomyelitis, left ankle and foot: Secondary | ICD-10-CM | POA: Diagnosis not present

## 2021-05-04 DIAGNOSIS — E1142 Type 2 diabetes mellitus with diabetic polyneuropathy: Secondary | ICD-10-CM

## 2021-05-04 DIAGNOSIS — L97529 Non-pressure chronic ulcer of other part of left foot with unspecified severity: Secondary | ICD-10-CM | POA: Diagnosis present

## 2021-05-04 DIAGNOSIS — I96 Gangrene, not elsewhere classified: Secondary | ICD-10-CM | POA: Diagnosis not present

## 2021-05-04 DIAGNOSIS — E1169 Type 2 diabetes mellitus with other specified complication: Secondary | ICD-10-CM

## 2021-05-04 DIAGNOSIS — E669 Obesity, unspecified: Secondary | ICD-10-CM | POA: Diagnosis not present

## 2021-05-04 DIAGNOSIS — L089 Local infection of the skin and subcutaneous tissue, unspecified: Secondary | ICD-10-CM | POA: Diagnosis not present

## 2021-05-04 DIAGNOSIS — Z89422 Acquired absence of other left toe(s): Secondary | ICD-10-CM | POA: Diagnosis not present

## 2021-05-04 DIAGNOSIS — K0889 Other specified disorders of teeth and supporting structures: Secondary | ICD-10-CM | POA: Diagnosis present

## 2021-05-04 DIAGNOSIS — Z791 Long term (current) use of non-steroidal anti-inflammatories (NSAID): Secondary | ICD-10-CM

## 2021-05-04 DIAGNOSIS — S98911A Complete traumatic amputation of right foot, level unspecified, initial encounter: Secondary | ICD-10-CM | POA: Diagnosis not present

## 2021-05-04 DIAGNOSIS — E119 Type 2 diabetes mellitus without complications: Principal | ICD-10-CM

## 2021-05-04 DIAGNOSIS — K219 Gastro-esophageal reflux disease without esophagitis: Secondary | ICD-10-CM | POA: Diagnosis not present

## 2021-05-04 DIAGNOSIS — M7731 Calcaneal spur, right foot: Secondary | ICD-10-CM | POA: Diagnosis not present

## 2021-05-04 DIAGNOSIS — Z7902 Long term (current) use of antithrombotics/antiplatelets: Secondary | ICD-10-CM

## 2021-05-04 DIAGNOSIS — Z83438 Family history of other disorder of lipoprotein metabolism and other lipidemia: Secondary | ICD-10-CM

## 2021-05-04 DIAGNOSIS — E1165 Type 2 diabetes mellitus with hyperglycemia: Secondary | ICD-10-CM | POA: Diagnosis present

## 2021-05-04 HISTORY — DX: Hypomagnesemia: E83.42

## 2021-05-04 HISTORY — DX: Cellulitis of right lower limb: L03.115

## 2021-05-04 HISTORY — DX: Type 2 diabetes mellitus with other specified complication: E11.69

## 2021-05-04 HISTORY — DX: Osteomyelitis, unspecified: M86.9

## 2021-05-04 HISTORY — DX: Benign prostatic hyperplasia without lower urinary tract symptoms: N40.0

## 2021-05-04 LAB — COMPREHENSIVE METABOLIC PANEL
ALT: 18 U/L (ref 0–44)
AST: 15 U/L (ref 15–41)
Albumin: 3.2 g/dL — ABNORMAL LOW (ref 3.5–5.0)
Alkaline Phosphatase: 66 U/L (ref 38–126)
Anion gap: 7 (ref 5–15)
BUN: 15 mg/dL (ref 8–23)
CO2: 27 mmol/L (ref 22–32)
Calcium: 9 mg/dL (ref 8.9–10.3)
Chloride: 101 mmol/L (ref 98–111)
Creatinine, Ser: 0.92 mg/dL (ref 0.61–1.24)
GFR, Estimated: >60 mL/min (ref >60)
Glucose, Bld: 242 mg/dL — ABNORMAL HIGH (ref 70–99)
Potassium: 4 mmol/L (ref 3.5–5.1)
Sodium: 135 mmol/L (ref 135–145)
Total Bilirubin: 0.4 mg/dL (ref 0.3–1.2)
Total Protein: 7.3 g/dL (ref 6.5–8.1)

## 2021-05-04 LAB — CBC WITH DIFFERENTIAL/PLATELET
Abs Immature Granulocytes: 0.03 10*3/uL (ref 0.00–0.07)
Basophils Absolute: 0.1 10*3/uL (ref 0.0–0.1)
Basophils Relative: 1 %
Eosinophils Absolute: 0.3 10*3/uL (ref 0.0–0.5)
Eosinophils Relative: 4 %
HCT: 39.5 % (ref 39.0–52.0)
Hemoglobin: 12.9 g/dL — ABNORMAL LOW (ref 13.0–17.0)
Immature Granulocytes: 0 %
Lymphocytes Relative: 14 %
Lymphs Abs: 1.3 10*3/uL (ref 0.7–4.0)
MCH: 29.5 pg (ref 26.0–34.0)
MCHC: 32.7 g/dL (ref 30.0–36.0)
MCV: 90.4 fL (ref 80.0–100.0)
Monocytes Absolute: 1 10*3/uL (ref 0.1–1.0)
Monocytes Relative: 11 %
Neutro Abs: 6.5 10*3/uL (ref 1.7–7.7)
Neutrophils Relative %: 70 %
Platelets: 289 10*3/uL (ref 150–400)
RBC: 4.37 MIL/uL (ref 4.22–5.81)
RDW: 13.7 % (ref 11.5–15.5)
WBC: 9.3 10*3/uL (ref 4.0–10.5)
nRBC: 0 % (ref 0.0–0.2)

## 2021-05-04 LAB — C-REACTIVE PROTEIN: CRP: 13.5 mg/dL — ABNORMAL HIGH (ref <1.0)

## 2021-05-04 LAB — LACTIC ACID, PLASMA
Lactic Acid, Venous: 1.2 mmol/L (ref 0.5–1.9)
Lactic Acid, Venous: 0.8 mmol/L (ref 0.5–1.9)

## 2021-05-04 LAB — GLUCOSE, CAPILLARY
Glucose-Capillary: 181 mg/dL — ABNORMAL HIGH (ref 70–99)
Glucose-Capillary: 241 mg/dL — ABNORMAL HIGH (ref 70–99)
Glucose-Capillary: 187 mg/dL — ABNORMAL HIGH (ref 70–99)
Glucose-Capillary: 227 mg/dL — ABNORMAL HIGH (ref 70–99)

## 2021-05-04 LAB — PROCALCITONIN: Procalcitonin: <0.1 ng/mL

## 2021-05-04 LAB — HIV ANTIBODY (ROUTINE TESTING W REFLEX): HIV Screen 4th Generation wRfx: NONREACTIVE

## 2021-05-04 LAB — HEMOGLOBIN A1C
Hgb A1c MFr Bld: 9.4 % — ABNORMAL HIGH (ref 4.8–5.6)
Mean Plasma Glucose: 223.08 mg/dL

## 2021-05-04 LAB — MAGNESIUM: Magnesium: 1.6 mg/dL — ABNORMAL LOW (ref 1.7–2.4)

## 2021-05-04 IMAGING — MR MR FOOT*R* W/O CM
5 series · 39 of 40 positions shown · non-contrast
Comparison: CT right foot dated [DATE]. MRI right foot
dated [DATE].

CLINICAL DATA: Right foot redness and swelling. Evaluate for
osteomyelitis.

EXAM:
MRI OF THE RIGHT FOREFOOT WITHOUT CONTRAST
TECHNIQUE: Multiplanar, multisequence MR imaging of the right forefoot was
performed. No intravenous contrast was administered.

[Series 3: T1 · coronal · right · 3.0mm · 0.55mm/px · 9 of 45 slices shown (1 of 2)]
[im 1/45]
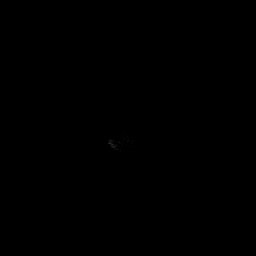
[im 6/45]
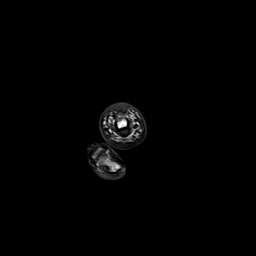
[im 12/45]
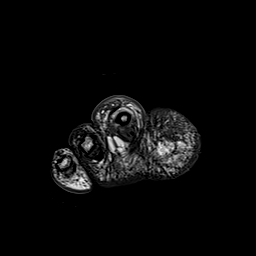
[im 17/45]
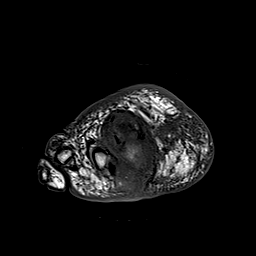
[im 23/45]
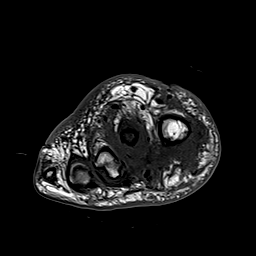
[im 28/45]
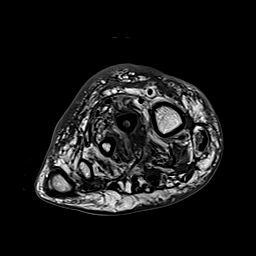
[im 34/45]
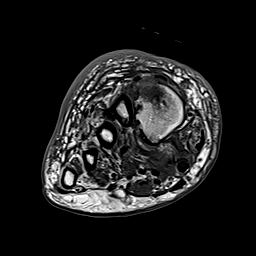
[im 39/45]
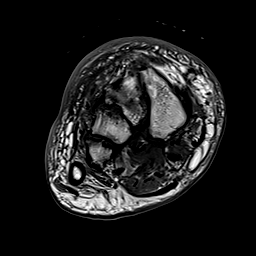
[im 45/45]
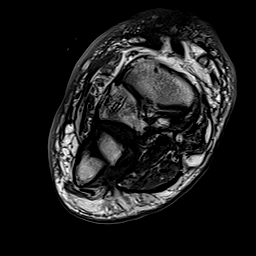

[Series 4: T2 fat-sat · coronal · right · 3.0mm · 0.44mm/px · 9 of 47 slices shown (1 of 2)]
[im 1/47]
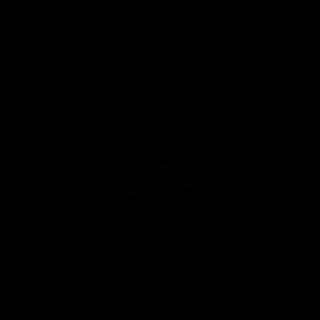
[im 6/47]
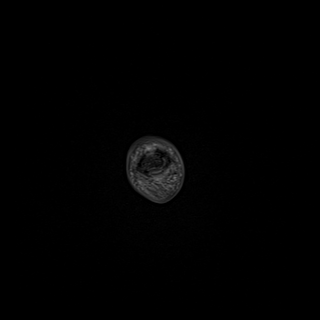
[im 11/47]
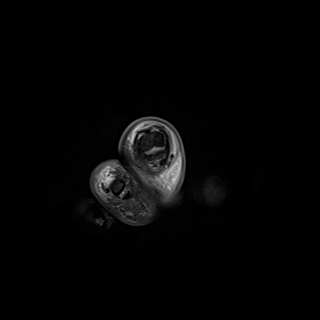
[im 16/47]
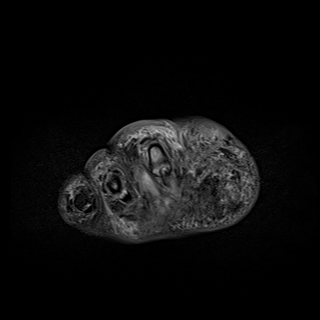
[im 21/47]
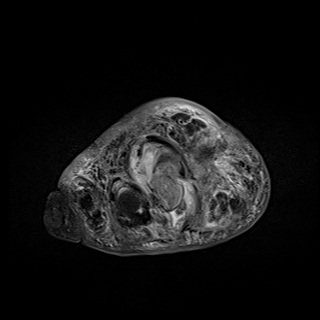
[im 26/47]
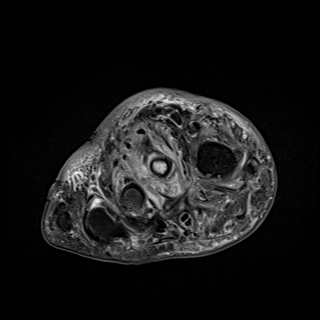
[im 31/47]
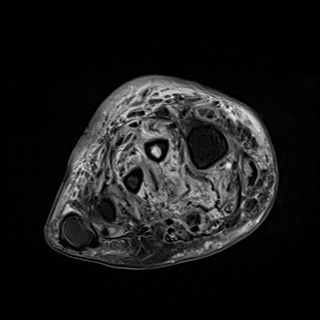
[im 41/47]
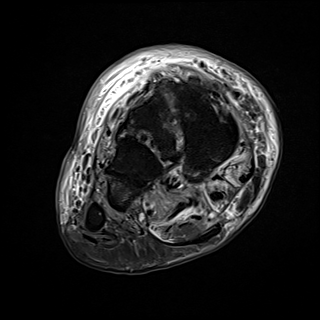
[im 47/47]
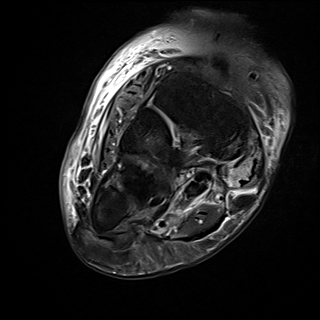

[Series 5: T2 fat-sat · axial · right · 3.0mm · 0.70mm/px · z∈[-138,-49]mm · 7 of 32 slices shown (2 of 2)]
[im 1/32]
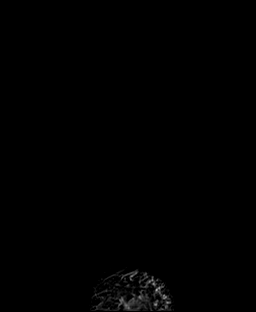
[im 6/32]
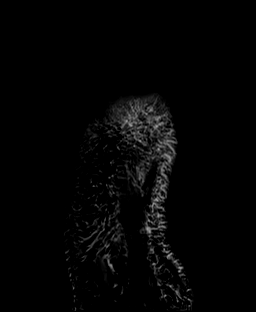
[im 11/32]
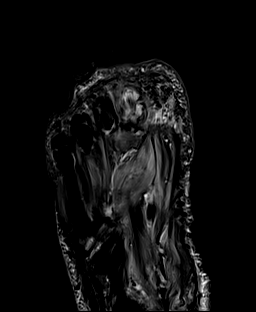
[im 16/32]
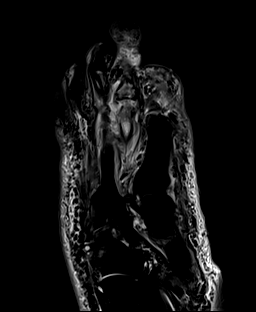
[im 21/32]
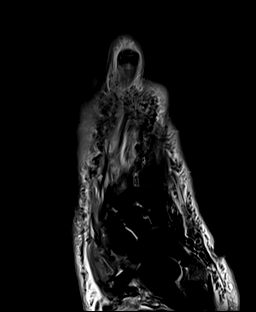
[im 26/32]
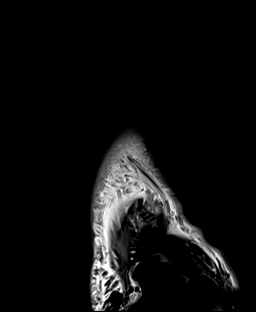
[im 32/32]
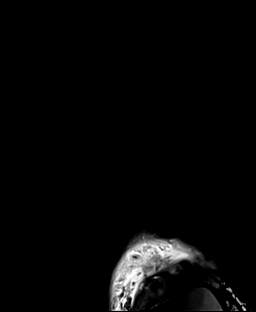

[Series 6: T1 · axial · right · 3.0mm · 0.70mm/px · z∈[-138,-49]mm · 7 of 32 slices shown (2 of 2)]
[im 1/32]
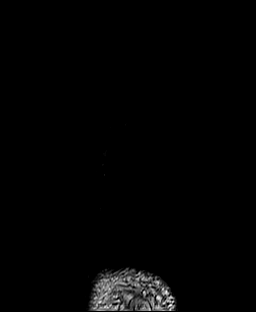
[im 6/32]
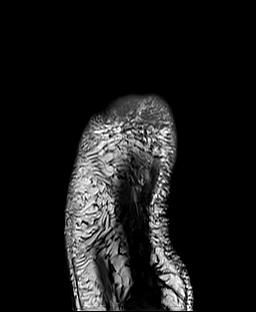
[im 11/32]
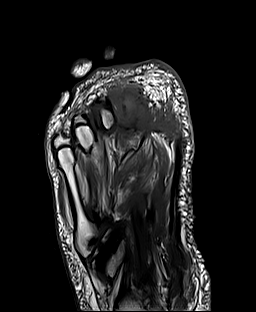
[im 16/32]
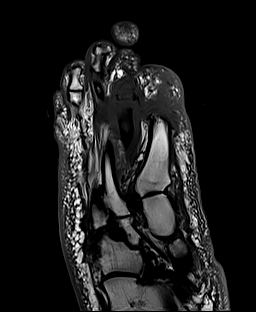
[im 21/32]
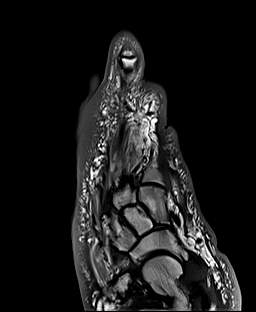
[im 26/32]
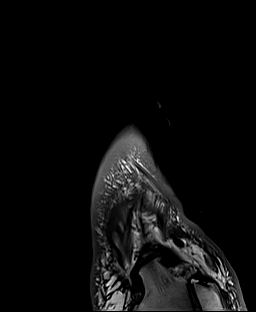
[im 32/32]
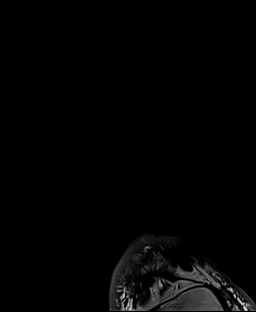

[Series 7: STIR · sagittal · right · 3.0mm · 0.35mm/px · 7 of 34 slices shown]
[im 1/34]
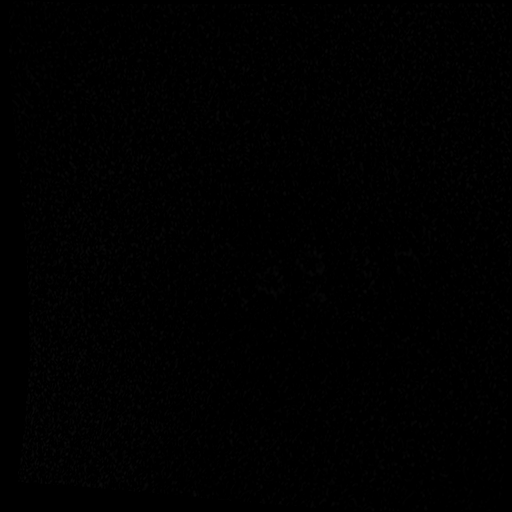
[im 6/34]
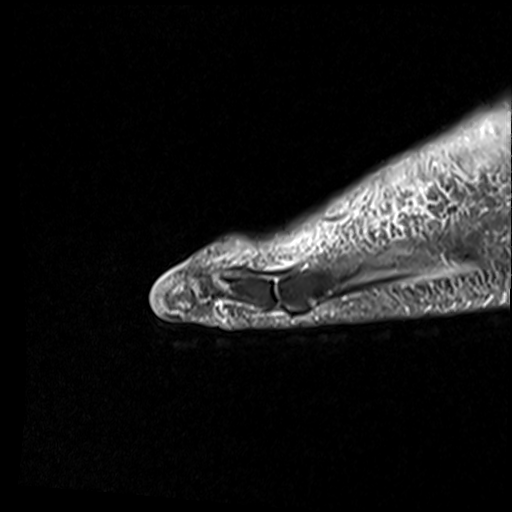
[im 12/34]
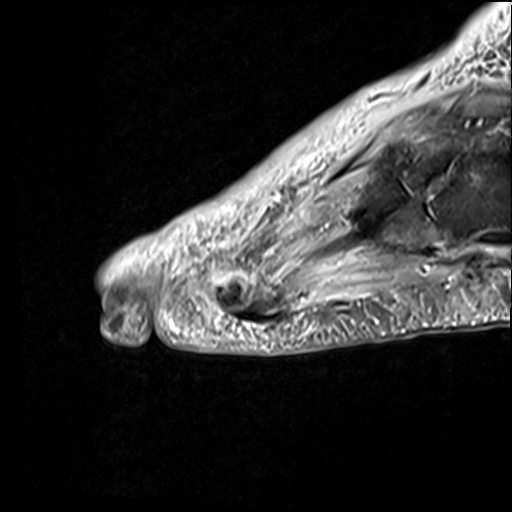
[im 17/34]
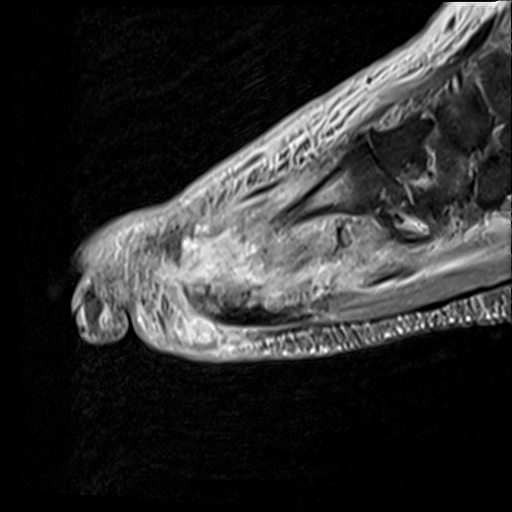
[im 23/34]
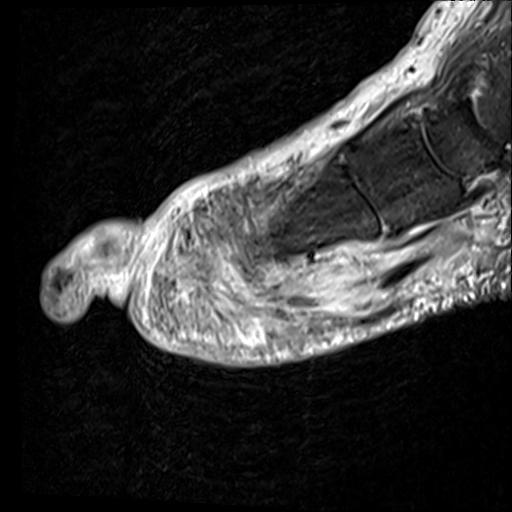
[im 28/34]
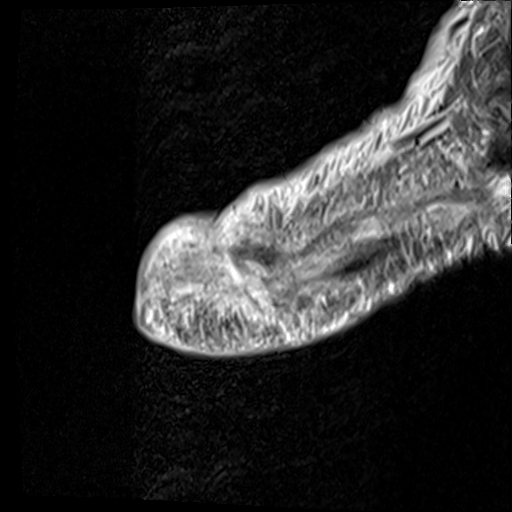
[im 34/34]
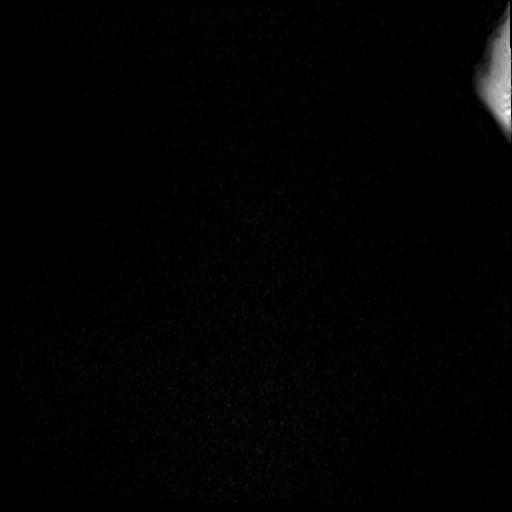

[39 of 40 positions shown; findings below may reference images not displayed]

FINDINGS: Bones/Joint/Cartilage

Prominent abnormal marrow edema with corresponding decreased T1
marrow signal involving the majority of the second metatarsal,
sparing the base, and the entire second proximal phalanx. Similar
but lesser marrow signal changes involving the third metatarsal head
and third proximal phalanx. Prior first ray amputation. Significant
dorsal subluxation at the second MTP joint. No fracture. Mild
midfoot osteoarthritis. Large second MTP joint effusion.

Ligaments

The second MTP joint collateral ligaments are eroded. Remaining
collateral ligaments are intact.

Muscles and Tendons
Postsurgical changes of the first flexor and extensor tendons. Fluid
within the second flexor tendon sheath. Increased T2 signal within
the intrinsic muscles of the forefoot, nonspecific, but likely
related to diabetic muscle changes.

Soft tissue
Severe forefoot soft tissue swelling extending into the second and
third toes. Plantar ulceration at the second metatarsal head with
sinus tract extending to the second MTP joint. No fluid collection.
No soft tissue mass.
IMPRESSION: 1. Plantar ulceration at the second metatarsal head with sinus tract
extending to the second MTP joint. Second MTP joint septic arthritis
and osteomyelitis of the second proximal phalanx and second
metatarsal.
2. Similar but lesser third MTP joint septic arthritis with
osteomyelitis of the third metatarsal head and third proximal
phalanx.
3. Severe forefoot soft tissue swelling extending into the second
and third toes. No abscess.

## 2021-05-04 MED ORDER — ASPIRIN EC 81 MG PO TBEC
81.0000 mg | DELAYED_RELEASE_TABLET | Freq: Every day | ORAL | Status: DC
  Administered 2021-05-04 – 2021-05-07 (×3): 81 mg via ORAL
  Filled 2021-05-04 (×3): qty 1

## 2021-05-04 MED ORDER — BENAZEPRIL HCL 20 MG PO TABS
20.0000 mg | ORAL_TABLET | Freq: Every day | ORAL | Status: DC
  Administered 2021-05-04 – 2021-05-07 (×4): 20 mg via ORAL
  Filled 2021-05-04 (×4): qty 1

## 2021-05-04 MED ORDER — NITROGLYCERIN 0.4 MG SL SUBL: 0.4000 mg | SUBLINGUAL_TABLET | SUBLINGUAL | Status: DC | PRN

## 2021-05-04 MED ORDER — INSULIN GLARGINE-YFGN 100 UNIT/ML ~~LOC~~ SOLN
25.0000 [IU] | Freq: Every day | SUBCUTANEOUS | Status: DC
  Administered 2021-05-04 – 2021-05-07 (×4): 25 [IU] via SUBCUTANEOUS
  Filled 2021-05-04 (×4): qty 0.25

## 2021-05-04 MED ORDER — EZETIMIBE 10 MG PO TABS
10.0000 mg | ORAL_TABLET | Freq: Every day | ORAL | Status: DC
  Administered 2021-05-04 – 2021-05-07 (×4): 10 mg via ORAL
  Filled 2021-05-04 (×4): qty 1

## 2021-05-04 MED ORDER — SODIUM CHLORIDE 0.9 % IV SOLN
2.0000 g | Freq: Three times a day (TID) | INTRAVENOUS | Status: DC
  Administered 2021-05-04 – 2021-05-07 (×10): 2 g via INTRAVENOUS
  Filled 2021-05-04 (×11): qty 12.5

## 2021-05-04 MED ORDER — POLYETHYLENE GLYCOL 3350 17 G PO PACK: 17.0000 g | PACK | Freq: Every day | ORAL | Status: DC | PRN

## 2021-05-04 MED ORDER — ONDANSETRON HCL 4 MG/2ML IJ SOLN: 4.0000 mg | Freq: Four times a day (QID) | INTRAMUSCULAR | Status: DC | PRN

## 2021-05-04 MED ORDER — ACETAMINOPHEN 325 MG PO TABS
650.0000 mg | ORAL_TABLET | Freq: Four times a day (QID) | ORAL | Status: DC | PRN
  Administered 2021-05-04 – 2021-05-06 (×2): 650 mg via ORAL
  Filled 2021-05-04 (×3): qty 2

## 2021-05-04 MED ORDER — MAGNESIUM SULFATE 2 GM/50ML IV SOLN
2.0000 g | Freq: Once | INTRAVENOUS | Status: AC
  Administered 2021-05-04: 2 g via INTRAVENOUS
  Filled 2021-05-04: qty 50

## 2021-05-04 MED ORDER — HYDRALAZINE HCL 20 MG/ML IJ SOLN
10.0000 mg | Freq: Four times a day (QID) | INTRAMUSCULAR | Status: DC | PRN
Start: 2021-05-04 — End: 2021-05-07

## 2021-05-04 MED ORDER — GABAPENTIN 300 MG PO CAPS
300.0000 mg | ORAL_CAPSULE | Freq: Two times a day (BID) | ORAL | Status: DC
  Administered 2021-05-04 – 2021-05-07 (×7): 300 mg via ORAL
  Filled 2021-05-04 (×7): qty 1

## 2021-05-04 MED ORDER — ONDANSETRON HCL 4 MG PO TABS: 4.0000 mg | ORAL_TABLET | Freq: Four times a day (QID) | ORAL | Status: DC | PRN

## 2021-05-04 MED ORDER — DULOXETINE HCL 60 MG PO CPEP
60.0000 mg | ORAL_CAPSULE | Freq: Every day | ORAL | Status: DC
  Administered 2021-05-04 – 2021-05-07 (×4): 60 mg via ORAL
  Filled 2021-05-04 (×4): qty 1

## 2021-05-04 MED ORDER — TAMSULOSIN HCL 0.4 MG PO CAPS
0.4000 mg | ORAL_CAPSULE | Freq: Every day | ORAL | Status: DC
  Administered 2021-05-04 – 2021-05-07 (×4): 0.4 mg via ORAL
  Filled 2021-05-04 (×4): qty 1

## 2021-05-04 MED ORDER — LIVING WELL WITH DIABETES BOOK
Freq: Once | Status: AC
  Filled 2021-05-04: qty 1

## 2021-05-04 MED ORDER — INSULIN ASPART 100 UNIT/ML IJ SOLN
0.0000 [IU] | Freq: Three times a day (TID) | INTRAMUSCULAR | Status: DC
  Administered 2021-05-04: 3 [IU] via SUBCUTANEOUS
  Administered 2021-05-04: 5 [IU] via SUBCUTANEOUS
  Administered 2021-05-04: 3 [IU] via SUBCUTANEOUS
  Administered 2021-05-04: 5 [IU] via SUBCUTANEOUS
  Administered 2021-05-05: 3 [IU] via SUBCUTANEOUS
  Administered 2021-05-05: 5 [IU] via SUBCUTANEOUS
  Administered 2021-05-05 – 2021-05-06 (×3): 3 [IU] via SUBCUTANEOUS
  Administered 2021-05-06 (×2): 5 [IU] via SUBCUTANEOUS
  Administered 2021-05-07: 2 [IU] via SUBCUTANEOUS

## 2021-05-04 MED ORDER — METOPROLOL TARTRATE 25 MG PO TABS
25.0000 mg | ORAL_TABLET | Freq: Two times a day (BID) | ORAL | Status: DC
  Administered 2021-05-04 – 2021-05-07 (×7): 25 mg via ORAL
  Filled 2021-05-04 (×7): qty 1

## 2021-05-04 MED ORDER — METRONIDAZOLE 500 MG/100ML IV SOLN
500.0000 mg | Freq: Two times a day (BID) | INTRAVENOUS | Status: DC
  Administered 2021-05-04 – 2021-05-07 (×7): 500 mg via INTRAVENOUS
  Filled 2021-05-04 (×8): qty 100

## 2021-05-04 MED ORDER — BUSPIRONE HCL 5 MG PO TABS
5.0000 mg | ORAL_TABLET | Freq: Two times a day (BID) | ORAL | Status: DC
  Administered 2021-05-04 – 2021-05-07 (×7): 5 mg via ORAL
  Filled 2021-05-04 (×7): qty 1

## 2021-05-04 MED ORDER — ROSUVASTATIN CALCIUM 10 MG PO TABS
10.0000 mg | ORAL_TABLET | Freq: Every day | ORAL | Status: DC
  Administered 2021-05-04 – 2021-05-07 (×4): 10 mg via ORAL
  Filled 2021-05-04 (×4): qty 1

## 2021-05-04 MED ORDER — LEVOTHYROXINE SODIUM 50 MCG PO TABS
175.0000 ug | ORAL_TABLET | Freq: Every day | ORAL | Status: DC
  Administered 2021-05-04 – 2021-05-07 (×3): 175 ug via ORAL
  Filled 2021-05-04 (×3): qty 1

## 2021-05-04 MED ORDER — OXYCODONE-ACETAMINOPHEN 5-325 MG PO TABS
1.0000 | ORAL_TABLET | ORAL | Status: DC | PRN
  Administered 2021-05-05 – 2021-05-06 (×2): 1 via ORAL
  Filled 2021-05-04 (×2): qty 1

## 2021-05-04 MED ORDER — CLOPIDOGREL BISULFATE 75 MG PO TABS
75.0000 mg | ORAL_TABLET | Freq: Every day | ORAL | Status: DC
Start: 2021-05-04 — End: 2021-05-07
  Administered 2021-05-04 – 2021-05-07 (×3): 75 mg via ORAL
  Filled 2021-05-04 (×3): qty 1

## 2021-05-04 MED ORDER — LACTATED RINGERS IV SOLN: INTRAVENOUS | Status: AC

## 2021-05-04 MED ORDER — MORPHINE SULFATE (PF) 2 MG/ML IV SOLN: 2.0000 mg | INTRAVENOUS | Status: DC | PRN

## 2021-05-04 MED ORDER — ACETAMINOPHEN 650 MG RE SUPP: 650.0000 mg | Freq: Four times a day (QID) | RECTAL | Status: DC | PRN

## 2021-05-04 MED ORDER — PANTOPRAZOLE SODIUM 40 MG PO TBEC
40.0000 mg | DELAYED_RELEASE_TABLET | Freq: Every day | ORAL | Status: DC
Start: 2021-05-04 — End: 2021-05-07
  Administered 2021-05-04 – 2021-05-07 (×4): 40 mg via ORAL
  Filled 2021-05-04 (×4): qty 1

## 2021-05-04 MED ORDER — VANCOMYCIN HCL 1250 MG/250ML IV SOLN
1250.0000 mg | Freq: Two times a day (BID) | INTRAVENOUS | Status: DC
  Administered 2021-05-04 – 2021-05-07 (×7): 1250 mg via INTRAVENOUS
  Filled 2021-05-04 (×9): qty 250

## 2021-05-04 NOTE — [place] Notes (Signed)
Pharmacy Antibiotic Note ? ?Robert Santiago is a 71 y.o. male admitted on 05/04/2021 as a transfer from [facility] after presenting with increasing redness of the right leg with x-ray imaging suspicious for developing osteomyelitis with possible abscess formation. Pharmacy has been consulted for vancomycin and cefepime dosing.  Pt received vancomycin 1gm x 1 and zosyn 3.375gm x1 at [facility], times unknown ? ?Plan: ?Vancomycin '1250mg'$  IV q12h (AUC 461.7, used Scr 0.8) ?Will go ahead and start since pt didn't get appropriate loading dose ?Cefepime 2gm IV q8h ?Flagyl '500mg'$  IV q12h per MD ?Follow renal function, cultures and clinical course ? ?  ? ?Temp (24hrs), Avg:98.4 ?F (36.9 ?C), Min:98.4 ?F (36.9 ?C), Max:98.4 ?F (36.9 ?C) ? ?No results for input(s): WBC, CREATININE, LATICACIDVEN, VANCOTROUGH, VANCOPEAK, VANCORANDOM, GENTTROUGH, GENTPEAK, GENTRANDOM, TOBRATROUGH, TOBRAPEAK, TOBRARND, AMIKACINPEAK, AMIKACINTROU, AMIKACIN in the last 168 hours.  ?CrCl cannot be calculated (Patient's most recent lab result is older than the maximum 21 days allowed.).   ? ?No Known Allergies ? ?Antimicrobials this admission: ?Vanc 4/20 >> ?Cefepime 4/20 >> ?Flagyl 4/20 >> ? ?Dose adjustments this admission: ? ? ?Microbiology results: ?4/20 BCx:  ? ? ?Thank you for allowing pharmacy to be a part of this patient?s care. ? ?Dolly Rias RPh ?05/04/2021, 3:29 AM ? ?

## 2021-05-04 NOTE — Assessment & Plan Note (Signed)
.   Continuing home regimen of lipid lowering therapy.  

## 2021-05-04 NOTE — Assessment & Plan Note (Signed)
   Continue home regimen of Flomax 

## 2021-05-04 NOTE — H&P (View-Only) (Signed)
?Subjective:  ?Patient ID: Robert Santiago, male    DOB: 04/17/1950,  MRN: [id number] ? ?Patient with past medical history of CAD, HTN, HLD, DM type 2 with neuropathy seen at beside today for worsening right foot infection and left foot wound. Patient has been in the care of Dr. Cannon Kettle. Was seen in [facility] for worsening pain redness and swelling in right foot and transferred to [facility] for IV abx and further management. Relates today pain is managed. Denies n/v/f/c. Marland Kitchen  ? ?Past Medical History:  ?Diagnosis Date  ? Acquired hypothyroidism 03/19/2019  ? Atherosclerosis of autologous vein coronary artery bypass graft(s) with unstable angina pectoris ([place]) 03/19/2019  ? Coronary artery disease involving native coronary artery of native heart with angina pectoris ([place]) 03/19/2019  ? Cath 08/20/14: Conclusions Diagnostic Procedure Summary 1. Severe, diffuse multi-vessel CAD 2. Normal LV function Diagnostic Procedure Recommendations CTS consult Angiographic findings Cardiac Arteries and Lesion Findings LMCA: Normal. LAD: Lesion on Prox LAD: 80% stenosis. Lesion on Dist LAD: 80% stenosis. LCx: Lesion on Prox CX: Proximal subsection.80% stenosis. Lesion on 1st Ob Marg: 80% stenos  ? Coronary artery disease of native artery of native heart with stable angina pectoris ([place])   ? COVID-19 08/2018  ? Diabetes mellitus without complication ([place])   ? Encounter for screening for diabetes mellitus 03/19/2019  ? IMOUPDATE  ? Essential hypertension   ? Hx of CABG 08/23/2014  ? (Dr. Darryl Nestle) CABG x5: LIMA-LAD, SVG-D1, SVG-RI, SVG-OM 1, SVG-RPL.  ? Hyperlipidemia   ? Multiple vessel coronary artery disease 08/21/2014  ? Cath [facility]): Right dominant -RCA: Proximal 95, mid 95, distal 95.  LM-normal.  LAD: Proximal 80%, distal 80%; LCx: Proximal 80%, OM1 80%. -Normal EF. -->  Referred for CABG  ? Progressive angina (HCC)-class III 12/19/2018  ? Status post cardiac catheterization 03/19/2019  ? Type 2 diabetes mellitus without  complication ([place]) 01/15/9145  ?  ? ?Past Surgical History:  ?Procedure Laterality Date  ? ACOUSTIC NEUROMA RESECTION    ? APPENDECTOMY    ? CARDIAC CATHETERIZATION  08/21/2014  ? Right dominant -RCA: Proximal 95, mid 95, distal 95.  LM-normal.  LAD: Proximal 80%, distal 80%; LCx: Proximal 80%, OM1 80%. -Normal EF. -->  Referred for CABG  ? CORONARY ARTERY BYPASS GRAFT  08/23/2014  ? (Dr. Darryl Nestle) CABG x5: LIMA-LAD, SVG-D1, SVG-RI, SVG-OM 1, SVG-RPL.  ? LEFT HEART CATH AND CORS/GRAFTS ANGIOGRAPHY N/A 02/09/2019  ? Procedure: LEFT HEART CATH AND CORS/GRAFTS ANGIOGRAPHY;  Surgeon: Leonie Man, MD;  Location: [place] [facility];  Service: Cardiovascular;  Laterality: N/A;  ? TRANSTHORACIC ECHOCARDIOGRAM  08/2014  ? ([place]) mild LVH, EF 55% with no R WMA.  Normal valves.  ? ? ? ?  Latest Ref Rng & Units 05/04/2021  ?  3:33 AM 04/22/2020  ? 11:22 AM 01/30/2019  ?  9:23 AM  ?CBC  ?WBC 4.0 - 10.5 K/uL 9.3   9.2   9.5    ?Hemoglobin 13.0 - 17.0 g/dL 12.9   14.3   15.4    ?Hematocrit 39.0 - 52.0 % 39.5   43.2   46.0    ?Platelets 150 - 400 K/uL 289   286   230    ? ? ? ?  Latest Ref Rng & Units 05/04/2021  ?  3:33 AM 04/22/2020  ? 11:22 AM 01/30/2019  ?  9:23 AM  ?BMP  ?Glucose 70 - 99 mg/dL 242   149   271    ?  BUN 8 - 23 mg/dL '15   15   16    '$ ?Creatinine 0.61 - 1.24 mg/dL 0.92   0.91   0.96    ?BUN/Creat Ratio 10 - '24  16   17    '$ ?Sodium 135 - 145 mmol/L 135   141   136    ?Potassium 3.5 - 5.1 mmol/L 4.0   4.6   4.6    ?Chloride 98 - 111 mmol/L 101   101   100    ?CO2 22 - 32 mmol/L '27   22   22    '$ ?Calcium 8.9 - 10.3 mg/dL 9.0   9.6   9.1    ? ? ? ?Objective:  ? ?Vitals:  ? 05/04/21 0643 05/04/21 1203  ?BP: 121/75 130/76  ?Pulse: 73 61  ?Resp: 17 14  ?Temp: 98.5 ?F (36.9 ?C) 97.8 ?F (36.6 ?C)  ?SpO2: 99% 100%  ? ? ?General:AA&O x 3. Normal mood and affect  ? ?Vascular: DP and PT pulses 2/4 bilateral. Brisk capillary refill to all digits. Pedal hair present  ? ?Neruological. Epicritic sensation grossly intact.   ? ?Derm: Right foot plantar wound with surrounding erythema and edema. Second and third digits with edema and erythema tracking proximally along the dorsum of the foot. No drainage noted. No probe to bone. Left lateral fourth digit in webspace full thickness wound with granular fibrotic base. Some mild erythema surrounding and edema of the left fourth digit noted.  ? ?MSK: MMT 5/5 in dorsiflexion, plantar flexion, inversion and eversion. Normal joint ROM without pain or crepitus.  ? ?MRI Left foot  ?IMPRESSION: ?1. Septic arthritis of the fourth PIP joint with progressive ?osteomyelitis of the fourth phalanges. ? ?MRI Right foot  ?IMPRESSION: ?1. Plantar ulceration at the second metatarsal head with sinus tract ?extending to the second MTP joint. Second MTP joint septic arthritis ?and osteomyelitis of the second proximal phalanx and second ?metatarsal. ?2. Similar but lesser third MTP joint septic arthritis with ?osteomyelitis of the third metatarsal head and third proximal ?phalanx. ?3. Severe forefoot soft tissue swelling extending into the second ?and third toes. No abscess. ?  ? ? ? ?Assessment & Plan:  ?Patient was evaluated and treated and all questions answered. ? ?DX: Right foot osteomyelitis second and third metatarsals and left fourth digit osteomyelitis.  ?Wound care: Aquacel, DSD per wound care.  ?Antibiotics: Per primary  ?DME: post-op shoe bilateral.   ?Discussed with patient diagnosis and treatment options.  ?Imaging reviewed. Right foot MRI showing osteomyelitis in second and third MTP joints and extending along second and third metatarsals. Left foot showing osteomyelitis in left fourth digit as well.  ?Discussed treatment options with patient. Discussed need for surgical intervention in order to treat the osteomyelitis. Discussed amputation of left fourth digit. Also discussed ray amputation vs transmetatarsal amputation on the right foot. Discussed that TMA would leave him with a more functional  foot .  Patient is agreement with plan for right foot transmetatarsal amputation and left fourth digit amputation.  ?Will plan for surgery tomorrow afternoon.  ?Patient to be NPO after midnight tonight.  ?Patient in agreement with plan and all questions answered.  ? ?Lorenda Peck, MD ? ?Accessible via secure chat for questions or concerns. ? ?

## 2021-05-04 NOTE — Assessment & Plan Note (Addendum)
?   Significant cellulitis of the right leg seems to be spreading from a small wound on the plantar surface of the foot. ?? Onset of cellulitis likely exacerbated by poorly controlled diabetes ?? Symptoms already seem to be improving since antibiotics were initiated at [facility] emergency department ?? Considering patient's poorly controlled diabetes we will continue broad-spectrum intravenous antibiotic therapy with metronidazole vancomycin and cefepime.  This regimen can be quickly de-escalated ?? ER provider at [facility] already discussed case with Dr. Blenda Mounts with podiatry, will reach out to them to notify them of patient's arrival to our facility. ?? We will keep patient n.p.o. in case of possible operative intervention later today ?? After discussing with podiatry will determine whether to MRI 1 or both of the feet to confirm the diagnosis of and evaluate the extent of the osteomyelitis. ?? As needed opiate-based analgesics for associated pain. ?? Gentle intravenous hydration ?? Blood cultures obtained ?

## 2021-05-04 NOTE — Consult Note (Signed)
?Subjective:  ?Patient ID: Robert Santiago, male    DOB: 1950/02/03,  MRN: [id number] ? ?Patient with past medical history of CAD, HTN, HLD, DM type 2 with neuropathy seen at beside today for worsening right foot infection and left foot wound. Patient has been in the care of Dr. Cannon Kettle. Was seen in [facility] for worsening pain redness and swelling in right foot and transferred to [facility] for IV abx and further management. Relates today pain is managed. Denies n/v/f/c. Marland Kitchen  ? ?Past Medical History:  ?Diagnosis Date  ? Acquired hypothyroidism 03/19/2019  ? Atherosclerosis of autologous vein coronary artery bypass graft(s) with unstable angina pectoris ([place]) 03/19/2019  ? Coronary artery disease involving native coronary artery of native heart with angina pectoris ([place]) 03/19/2019  ? Cath 08/20/14: Conclusions Diagnostic Procedure Summary 1. Severe, diffuse multi-vessel CAD 2. Normal LV function Diagnostic Procedure Recommendations CTS consult Angiographic findings Cardiac Arteries and Lesion Findings LMCA: Normal. LAD: Lesion on Prox LAD: 80% stenosis. Lesion on Dist LAD: 80% stenosis. LCx: Lesion on Prox CX: Proximal subsection.80% stenosis. Lesion on 1st Ob Marg: 80% stenos  ? Coronary artery disease of native artery of native heart with stable angina pectoris ([place])   ? COVID-19 08/2018  ? Diabetes mellitus without complication ([place])   ? Encounter for screening for diabetes mellitus 03/19/2019  ? IMOUPDATE  ? Essential hypertension   ? Hx of CABG 08/23/2014  ? (Dr. Darryl Nestle) CABG x5: LIMA-LAD, SVG-D1, SVG-RI, SVG-OM 1, SVG-RPL.  ? Hyperlipidemia   ? Multiple vessel coronary artery disease 08/21/2014  ? Cath [facility]): Right dominant -RCA: Proximal 95, mid 95, distal 95.  LM-normal.  LAD: Proximal 80%, distal 80%; LCx: Proximal 80%, OM1 80%. -Normal EF. -->  Referred for CABG  ? Progressive angina (HCC)-class III 12/19/2018  ? Status post cardiac catheterization 03/19/2019  ? Type 2 diabetes mellitus without  complication ([place]) 05/16/7614  ?  ? ?Past Surgical History:  ?Procedure Laterality Date  ? ACOUSTIC NEUROMA RESECTION    ? APPENDECTOMY    ? CARDIAC CATHETERIZATION  08/21/2014  ? Right dominant -RCA: Proximal 95, mid 95, distal 95.  LM-normal.  LAD: Proximal 80%, distal 80%; LCx: Proximal 80%, OM1 80%. -Normal EF. -->  Referred for CABG  ? CORONARY ARTERY BYPASS GRAFT  08/23/2014  ? (Dr. Darryl Nestle) CABG x5: LIMA-LAD, SVG-D1, SVG-RI, SVG-OM 1, SVG-RPL.  ? LEFT HEART CATH AND CORS/GRAFTS ANGIOGRAPHY N/A 02/09/2019  ? Procedure: LEFT HEART CATH AND CORS/GRAFTS ANGIOGRAPHY;  Surgeon: Leonie Man, MD;  Location: [place] [facility];  Service: Cardiovascular;  Laterality: N/A;  ? TRANSTHORACIC ECHOCARDIOGRAM  08/2014  ? ([place]) mild LVH, EF 55% with no R WMA.  Normal valves.  ? ? ? ?  Latest Ref Rng & Units 05/04/2021  ?  3:33 AM 04/22/2020  ? 11:22 AM 01/30/2019  ?  9:23 AM  ?CBC  ?WBC 4.0 - 10.5 K/uL 9.3   9.2   9.5    ?Hemoglobin 13.0 - 17.0 g/dL 12.9   14.3   15.4    ?Hematocrit 39.0 - 52.0 % 39.5   43.2   46.0    ?Platelets 150 - 400 K/uL 289   286   230    ? ? ? ?  Latest Ref Rng & Units 05/04/2021  ?  3:33 AM 04/22/2020  ? 11:22 AM 01/30/2019  ?  9:23 AM  ?BMP  ?Glucose 70 - 99 mg/dL 242   149   271    ?  BUN 8 - 23 mg/dL '15   15   16    '$ ?Creatinine 0.61 - 1.24 mg/dL 0.92   0.91   0.96    ?BUN/Creat Ratio 10 - '24  16   17    '$ ?Sodium 135 - 145 mmol/L 135   141   136    ?Potassium 3.5 - 5.1 mmol/L 4.0   4.6   4.6    ?Chloride 98 - 111 mmol/L 101   101   100    ?CO2 22 - 32 mmol/L '27   22   22    '$ ?Calcium 8.9 - 10.3 mg/dL 9.0   9.6   9.1    ? ? ? ?Objective:  ? ?Vitals:  ? 05/04/21 0643 05/04/21 1203  ?BP: 121/75 130/76  ?Pulse: 73 61  ?Resp: 17 14  ?Temp: 98.5 ?F (36.9 ?C) 97.8 ?F (36.6 ?C)  ?SpO2: 99% 100%  ? ? ?General:AA&O x 3. Normal mood and affect  ? ?Vascular: DP and PT pulses 2/4 bilateral. Brisk capillary refill to all digits. Pedal hair present  ? ?Neruological. Epicritic sensation grossly intact.   ? ?Derm: Right foot plantar wound with surrounding erythema and edema. Second and third digits with edema and erythema tracking proximally along the dorsum of the foot. No drainage noted. No probe to bone. Left lateral fourth digit in webspace full thickness wound with granular fibrotic base. Some mild erythema surrounding and edema of the left fourth digit noted.  ? ?MSK: MMT 5/5 in dorsiflexion, plantar flexion, inversion and eversion. Normal joint ROM without pain or crepitus.  ? ?MRI Left foot  ?IMPRESSION: ?1. Septic arthritis of the fourth PIP joint with progressive ?osteomyelitis of the fourth phalanges. ? ?MRI Right foot  ?IMPRESSION: ?1. Plantar ulceration at the second metatarsal head with sinus tract ?extending to the second MTP joint. Second MTP joint septic arthritis ?and osteomyelitis of the second proximal phalanx and second ?metatarsal. ?2. Similar but lesser third MTP joint septic arthritis with ?osteomyelitis of the third metatarsal head and third proximal ?phalanx. ?3. Severe forefoot soft tissue swelling extending into the second ?and third toes. No abscess. ?  ? ? ? ?Assessment & Plan:  ?Patient was evaluated and treated and all questions answered. ? ?DX: Right foot osteomyelitis second and third metatarsals and left fourth digit osteomyelitis.  ?Wound care: Aquacel, DSD per wound care.  ?Antibiotics: Per primary  ?DME: post-op shoe bilateral.   ?Discussed with patient diagnosis and treatment options.  ?Imaging reviewed. Right foot MRI showing osteomyelitis in second and third MTP joints and extending along second and third metatarsals. Left foot showing osteomyelitis in left fourth digit as well.  ?Discussed treatment options with patient. Discussed need for surgical intervention in order to treat the osteomyelitis. Discussed amputation of left fourth digit. Also discussed ray amputation vs transmetatarsal amputation on the right foot. Discussed that TMA would leave him with a more functional  foot .  Patient is agreement with plan for right foot transmetatarsal amputation and left fourth digit amputation.  ?Will plan for surgery tomorrow afternoon.  ?Patient to be NPO after midnight tonight.  ?Patient in agreement with plan and all questions answered.  ? ?Lorenda Peck, MD ? ?Accessible via secure chat for questions or concerns. ? ?

## 2021-05-04 NOTE — Assessment & Plan Note (Signed)
.   Resume home regimen of Synthroid 

## 2021-05-04 NOTE — Progress Notes (Signed)
Patient admitted earlier this morning for right leg/foot pain with concern for right toe osteomyelitis with possible abscess.  He was transferred from [facility] as per recommendations from podiatry.  He has been started on IV antibiotics.  Patient seen and examined at bedside and plan of care discussed with him.  I have reviewed patient's medical records including this morning's H&P, current vitals, labs and medications myself.  Continue antibiotics.  Await podiatry recommendations. ?

## 2021-05-04 NOTE — Progress Notes (Signed)
Transition of Care (TOC) Screening Note ? ?Patient Details  ?Name: Robert Santiago ?Date of Birth: January 22, 1950 ? ?Transition of Care (TOC) CM/SW Contact:    ?Sherie Don, LCSW ?Phone Number: ?05/04/2021, 11:44 AM ? ?Transition of Care Department [facility]) has reviewed patient and no TOC needs have been identified at this time. We will continue to monitor patient advancement through interdisciplinary progression rounds. If new patient transition needs arise, please place a TOC consult. ?

## 2021-05-04 NOTE — Assessment & Plan Note (Signed)
.   Resume patients home regimen of oral antihypertensives . Titrate antihypertensive regimen as necessary to achieve adequate BP control . PRN intravenous antihypertensives for excessively elevated blood pressure   

## 2021-05-04 NOTE — Plan of Care (Signed)
  Problem: Clinical Measurements: Goal: Respiratory complications will improve Outcome: Progressing   Problem: Clinical Measurements: Goal: Cardiovascular complication will be avoided Outcome: Progressing   Problem: Coping: Goal: Level of anxiety will decrease Outcome: Progressing   Problem: Safety: Goal: Ability to remain free from injury will improve Outcome: Progressing   Problem: Skin Integrity: Goal: Risk for impaired skin integrity will decrease Outcome: Progressing   

## 2021-05-04 NOTE — Assessment & Plan Note (Signed)
·   Please see assessment and plan above °

## 2021-05-04 NOTE — Assessment & Plan Note (Signed)
•   Patient is currently chest pain free °• Monitoring patient on telemetry °• Continue home regimen of antiplatelet therapy, lipid lowering therapy and AV nodal blocking therapy ° °

## 2021-05-04 NOTE — H&P (Signed)
?History and Physical  ? ? ?Patient: Robert Santiago MRN: [id number] DOA: 05/04/2021 ? ?Date of Service: the patient was seen and examined on 05/04/2021 ? ?Patient coming from: Outside Hospital ? ?Chief Complaint:  Right leg/foot pain. ? ? ?HPI:  ? ?71 year old male with past medical history of coronary artery disease (S/P CABG 08/2014, last cath 01/2019 revealing multiple occluded grafts), hypertension, hyperlipidemia, diabetes mellitus type 2 with diabetic polyneuropathy, gastroesophageal reflux disease, benign prostatic hyperplasia, hypothyroidism as well as multiple ulcerations of the bilateral lower extremities with recent diagnosis of osteomyelitis of the left fourth toe as well as history of right great toe osteomyelitis status post right great toe amputation 10/18/2020 (follows with Dr. Cannon Kettle with podiatry).  Patient presenting to [facility] long hospital as a transfer from [facility] after presenting with increasing redness of the right leg with x-ray imaging suspicious for developing osteomyelitis with possible abscess formation. ? ?Patient has been complaining of progressively worsening pain redness and swelling of the right foot and leg over the past 1 week.  Pain is severe in intensity, sharp in quality and worse with weightbearing or movement of the affected extremity.  Patient describes it "like a toothache."  Patient denies any associated fevers nausea vomiting.  Patient denies associated weakness.  Patient does complain of subjective fevers and chills but has not taken his temperature. ? ?Due to patient's progressively worsening symptoms patient eventually presented to [facility] emergency department for evaluation.  Upon evaluation in the emergency department patient was found to clinically have evidence of a right lower extremity cellulitis.  Initial work-up revealed a leukocytosis of 12.3.  X-ray performed of the right foot revealed osteomyelitis of the right toe with an area concerning for  developing abscess.  [facility] health ER provider discussed case with Dr. Blenda Mounts with DPM who agreed with hospitalist admission with podiatry to consult and transfer to [facility] long hospital. ? ? ? ?Review of Systems: Review of Systems  ?Constitutional:  Positive for malaise/fatigue and weight loss.  ?Musculoskeletal:   ?     Right foot and leg pain  ?Skin:   ?     Multiple ulcerations of the lower extremities  ?Neurological:  Positive for weakness.  ?All other systems reviewed and are negative. ? ? ?Past Medical History:  ?Diagnosis Date  ? Acquired hypothyroidism 03/19/2019  ? Atherosclerosis of autologous vein coronary artery bypass graft(s) with unstable angina pectoris ([place]) 03/19/2019  ? Coronary artery disease involving native coronary artery of native heart with angina pectoris ([place]) 03/19/2019  ? Cath 08/20/14: Conclusions Diagnostic Procedure Summary 1. Severe, diffuse multi-vessel CAD 2. Normal LV function Diagnostic Procedure Recommendations CTS consult Angiographic findings Cardiac Arteries and Lesion Findings LMCA: Normal. LAD: Lesion on Prox LAD: 80% stenosis. Lesion on Dist LAD: 80% stenosis. LCx: Lesion on Prox CX: Proximal subsection.80% stenosis. Lesion on 1st Ob Marg: 80% stenos  ? Coronary artery disease of native artery of native heart with stable angina pectoris ([place])   ? COVID-19 08/2018  ? Diabetes mellitus without complication ([place])   ? Encounter for screening for diabetes mellitus 03/19/2019  ? IMOUPDATE  ? Essential hypertension   ? Hx of CABG 08/23/2014  ? (Dr. Darryl Nestle) CABG x5: LIMA-LAD, SVG-D1, SVG-RI, SVG-OM 1, SVG-RPL.  ? Hyperlipidemia   ? Multiple vessel coronary artery disease 08/21/2014  ? Cath [facility]): Right dominant -RCA: Proximal 95, mid 95, distal 95.  LM-normal.  LAD: Proximal 80%, distal 80%; LCx: Proximal 80%, OM1 80%. -Normal EF. -->  Referred  for CABG  ? Progressive angina (HCC)-class III 12/19/2018  ? Status post cardiac catheterization 03/19/2019  ? Type 2 diabetes  mellitus without complication ([place]) 4/0/3524  ? ? ?Past Surgical History:  ?Procedure Laterality Date  ? ACOUSTIC NEUROMA RESECTION    ? APPENDECTOMY    ? CARDIAC CATHETERIZATION  08/21/2014  ? Right dominant -RCA: Proximal 95, mid 95, distal 95.  LM-normal.  LAD: Proximal 80%, distal 80%; LCx: Proximal 80%, OM1 80%. -Normal EF. -->  Referred for CABG  ? CORONARY ARTERY BYPASS GRAFT  08/23/2014  ? (Dr. Darryl Nestle) CABG x5: LIMA-LAD, SVG-D1, SVG-RI, SVG-OM 1, SVG-RPL.  ? LEFT HEART CATH AND CORS/GRAFTS ANGIOGRAPHY N/A 02/09/2019  ? Procedure: LEFT HEART CATH AND CORS/GRAFTS ANGIOGRAPHY;  Surgeon: Leonie Man, MD;  Location: [facility];  Service: Cardiovascular;  Laterality: N/A;  ? TRANSTHORACIC ECHOCARDIOGRAM  08/2014  ? ([place]) mild LVH, EF 55% with no R WMA.  Normal valves.  ? ? ?Social History:  reports that he quit smoking about 20 years ago. His smoking use included cigarettes. He has a 30.00 pack-year smoking history. He has never used smokeless tobacco. He reports current alcohol use. He reports that he does not use drugs. ? ?No Known Allergies ? ?Family History  ?Problem Relation Age of Onset  ? COPD Mother   ? Heart failure Mother   ? Hyperlipidemia Mother   ? Hypertension Mother   ? Heart disease Father   ? Hypertension Father   ? Hyperlipidemia Father   ? Heart attack Brother   ? Heart disease Brother   ? Heart disease Brother   ? Heart disease Brother   ? ? ?Prior to Admission medications   ?Medication Sig Start Date End Date Taking? Authorizing Provider  ?acetaminophen (TYLENOL) 500 MG tablet Take 1,000 mg by mouth every 6 (six) hours as needed for moderate pain or headache.    [provider]  ?ALPRAZolam Duanne Moron) 0.5 MG tablet 0.5 mg. 10/18/20   [provider]  ?amoxicillin-clavulanate (AUGMENTIN) 875-125 MG tablet Take 1 tablet by mouth 2 (two) times daily. 05/02/21   Landis Martins, DPM  ?aspirin EC 81 MG tablet Take 81 mg by mouth daily.    [provider]  ?BD PEN NEEDLE NANO U/F 32G X 4 MM MISC SMARTSIG:1 Each SUB-Q Daily 02/09/21   [provider]  ?benazepril (LOTENSIN) 20 MG tablet Take 20 mg by mouth daily. 12/12/18   [provider]  ?Blood Glucose Monitoring Suppl (ACCU-CHEK GUIDE) w/Device KIT  11/21/20   [provider]  ?busPIRone (BUSPAR) 5 MG tablet Take 5 mg by mouth 2 (two) times daily. 03/22/21   [provider]  ?citalopram (CELEXA) 40 MG tablet Take 40 mg by mouth daily.    [provider]  ?clopidogrel (PLAVIX) 75 MG tablet Take 1 tablet (75 mg total) by mouth daily. 04/06/20   Richardo Priest, MD  ?clotrimazole-betamethasone (LOTRISONE) cream Apply 1 application topically daily as needed for rash. 01/30/19   [provider]  ?DULoxetine (CYMBALTA) 60 MG capsule  10/11/20   [provider]  ?ezetimibe (ZETIA) 10 MG tablet Take 10 mg by mouth daily.    [provider]  ?gabapentin (NEURONTIN) 300 MG capsule Take 1 capsule (300 mg total) by mouth 2 (two) times daily. 03/02/20   Landis Martins, DPM  ?gentamicin ointment (GARAMYCIN) 0.1 % Apply 1 application. topically daily. For wound care put a small amount to the ointment to wound then cover  with silver alginate and dry dressing once daily 05/02/21   Landis Martins, DPM  ?glimepiride (AMARYL) 4 MG tablet Take 4 mg by mouth 2 (two) times daily.     [provider]  ?JANUVIA 100 MG tablet Take 100 mg by mouth daily. 06/17/20   [provider]  ?LANTUS SOLOSTAR 100 UNIT/ML Solostar Pen Inject 25 Units into the skin daily. 08/26/19   [provider]  ?levothyroxine (SYNTHROID) 175 MCG tablet Take 175 mcg by mouth daily.     [provider]  ?meloxicam (MOBIC) 15 MG tablet Take 15 mg by mouth daily. 10/20/19   [provider]  ?metFORMIN (GLUCOPHAGE) 1000 MG tablet Take 1,000 mg by mouth 2 (two) times daily. 02/21/21   [provider]  ?metFORMIN (GLUCOPHAGE) 850 MG tablet Take  850 mg by mouth 2 (two) times daily.  11/22/18   [provider]  ?metoprolol tartrate (LOPRESSOR) 25 MG tablet Take 25 mg by mouth 2 (two) times daily.  08/27/14   [provider]  ?nitroGLYCER

## 2021-05-04 NOTE — Assessment & Plan Note (Signed)
Continuing home regimen of daily PPI therapy.  

## 2021-05-04 NOTE — Consult Note (Signed)
WOC Nurse Consult Note: ?Reason for Consult:Bilateral foot wounds. Followed by podiatry and saw Dr. Wallie Char last on 04/26/21. ?Wound type:Neuropathic, infectious ?Pressure Injury POA: N/A ?Measurement:per Dr. Leeanne Rio last visit ?Left foot:  1xm x 1cm at 4th webspace. Probes to bone ?Right foot: 0.4cm x 0.8cm x 0.2cm at plantar aspect ?Wound bed: Both red, moist ?Drainage (amount, consistency, odor) serous, moderate ?Periwound:erythematous, edematous ?Dressing procedure/placement/frequency: I will implement a daily application of silver hydrofiber (Aquacel Ag+ Advantage). Currently a non formulary antimicrobial, iodosorb, is being used. ? ?Patient is followed by Podiatry in the community.  If you desire alternate wound care guidance that that which is provided today, consider consultation with Podiatric Medicine while in house. Alternatively, may also consider consult to Orthopedics. ? ?[facility] nursing team will not follow, but will remain available to this patient, the nursing and medical teams.  Please re-consult if needed. ?Thanks, ?Maudie Flakes, MSN, RN, [place], [place], CWON-AP, [place]  ?Pager# [phone]  ? ?  ?

## 2021-05-04 NOTE — Progress Notes (Signed)
Inpatient Diabetes Program Recommendations ? ?AACE/ADA: New Consensus Statement on Inpatient Glycemic Control (2015) ? ?Target Ranges:  Prepandial:   less than 140 mg/dL ?     Peak postprandial:   less than 180 mg/dL (1-2 hours) ?     Critically ill patients:  140 - 180 mg/dL  ? ?Lab Results  ?Component Value Date  ? GLUCAP 241 (H) 05/04/2021  ? HGBA1C 9.4 (H) 05/04/2021  ? ? ?Review of Glycemic Control ? Latest Reference Range & Units 05/04/21 07:27 05/04/21 11:54  ?Glucose-Capillary 70 - 99 mg/dL 181 (H) 241 (H)  ?(H): Data is abnormally high ? ?Diabetes history: DM2 ?Outpatient Diabetes medications: Lantua 48-50 units QD, Metformin 1000 mg BID ?Current orders for Inpatient glycemic control: Semglee 25 units QD, Novolog 0-15 units TID and 0-5 units QHS ? ? ?Spoke with patient and wife at bedside.  Reviewed patient's current A1c of 9.4% (average blood glucose of 223 mg/dL. Explained what a A1c is and what it measures. Also reviewed goal A1c with patient, importance of good glucose control @ home, and blood sugar goals.  He states his PCP has had him increase his Lantus until his fasting BG is 100-110.  He states he has been administering 48 units mostly.  Fasting is ranging from 100-140 mg/dl, however, given A1C of 9.4% his is likely hyperglycemic postprandially.  Asked him to start checking his CBG 2-3 hours after lunch.  Ordered LWWD booklet and RD consult per request of wife.  He is aware of hypoglycemia, signs, symptoms and treatments. ? ?He would likely benefit from an oral agent such as Glipizide 2.5 mg BID.   ? ?Will continue to follow while inpatient. ? ?Thank you, ?Reche Dixon, MSN, RN ?Diabetes Coordinator ?Inpatient Diabetes Program ?[phone] (team pager from 8a-5p) ? ? ? ? ? ? ?

## 2021-05-04 NOTE — Assessment & Plan Note (Signed)
?   Patient reports longstanding poorly controlled blood sugars which likely played a significant role in the patient's significant infection. ?? Patient been placed on Accu-Cheks every 6 hours with sliding scale insulin while patient is n.p.o. ?? Holding home regimen of hypoglycemics ?? Hemoglobin A1C ordered ?? Diabetic Diet will be resumed once n.p.o. is discontinued. ? ?

## 2021-05-04 NOTE — Assessment & Plan Note (Signed)
?   Replacing with intravenous magnesium sulfate ?? Monitoring magnesium levels with serial chemistries ?

## 2021-05-04 NOTE — Progress Notes (Signed)
Skin assessment revealed pressure injury between last two toes on patients left foot, right great toe amputation, and injury/trauma on sole of right foot.  ?

## 2021-05-05 ENCOUNTER — Inpatient Hospital Stay (HOSPITAL_COMMUNITY): Payer: Medicare HMO | Admitting: Anesthesiology

## 2021-05-05 ENCOUNTER — Inpatient Hospital Stay (HOSPITAL_COMMUNITY): Payer: Medicare HMO

## 2021-05-05 ENCOUNTER — Encounter (HOSPITAL_COMMUNITY): Payer: Self-pay | Admitting: Internal Medicine

## 2021-05-05 ENCOUNTER — Encounter (HOSPITAL_COMMUNITY): Admission: AD | Disposition: A | Discharge status: Home / Self Care | Payer: Self-pay | Attending: Internal Medicine

## 2021-05-05 ENCOUNTER — Other Ambulatory Visit: Payer: Self-pay

## 2021-05-05 DIAGNOSIS — N400 Enlarged prostate without lower urinary tract symptoms: Secondary | ICD-10-CM | POA: Diagnosis not present

## 2021-05-05 DIAGNOSIS — M869 Osteomyelitis, unspecified: Secondary | ICD-10-CM | POA: Diagnosis not present

## 2021-05-05 DIAGNOSIS — I10 Essential (primary) hypertension: Secondary | ICD-10-CM | POA: Diagnosis not present

## 2021-05-05 DIAGNOSIS — E1169 Type 2 diabetes mellitus with other specified complication: Secondary | ICD-10-CM

## 2021-05-05 DIAGNOSIS — I2510 Atherosclerotic heart disease of native coronary artery without angina pectoris: Secondary | ICD-10-CM

## 2021-05-05 DIAGNOSIS — L03115 Cellulitis of right lower limb: Secondary | ICD-10-CM | POA: Diagnosis not present

## 2021-05-05 DIAGNOSIS — M86171 Other acute osteomyelitis, right ankle and foot: Secondary | ICD-10-CM

## 2021-05-05 DIAGNOSIS — M86172 Other acute osteomyelitis, left ankle and foot: Secondary | ICD-10-CM

## 2021-05-05 HISTORY — DX: Osteomyelitis, unspecified: M86.9

## 2021-05-05 HISTORY — PX: AMPUTATION TOE: SHX6595

## 2021-05-05 HISTORY — PX: AMPUTATION: SHX166

## 2021-05-05 LAB — BASIC METABOLIC PANEL
Anion gap: 7 (ref 5–15)
BUN: 15 mg/dL (ref 8–23)
CO2: 25 mmol/L (ref 22–32)
Calcium: 8.9 mg/dL (ref 8.9–10.3)
Chloride: 104 mmol/L (ref 98–111)
Creatinine, Ser: 0.85 mg/dL (ref 0.61–1.24)
GFR, Estimated: >60 mL/min (ref >60)
Glucose, Bld: 214 mg/dL — ABNORMAL HIGH (ref 70–99)
Potassium: 3.9 mmol/L (ref 3.5–5.1)
Sodium: 136 mmol/L (ref 135–145)

## 2021-05-05 LAB — CBC WITH DIFFERENTIAL/PLATELET
Abs Immature Granulocytes: 0.04 10*3/uL (ref 0.00–0.07)
Basophils Absolute: 0.1 10*3/uL (ref 0.0–0.1)
Basophils Relative: 1 %
Eosinophils Absolute: 0.5 10*3/uL (ref 0.0–0.5)
Eosinophils Relative: 5 %
HCT: 36.1 % — ABNORMAL LOW (ref 39.0–52.0)
Hemoglobin: 12.2 g/dL — ABNORMAL LOW (ref 13.0–17.0)
Immature Granulocytes: 0 %
Lymphocytes Relative: 12 %
Lymphs Abs: 1.1 10*3/uL (ref 0.7–4.0)
MCH: 30 pg (ref 26.0–34.0)
MCHC: 33.8 g/dL (ref 30.0–36.0)
MCV: 88.7 fL (ref 80.0–100.0)
Monocytes Absolute: 1 10*3/uL (ref 0.1–1.0)
Monocytes Relative: 11 %
Neutro Abs: 6.8 10*3/uL (ref 1.7–7.7)
Neutrophils Relative %: 71 %
Platelets: 302 10*3/uL (ref 150–400)
RBC: 4.07 MIL/uL — ABNORMAL LOW (ref 4.22–5.81)
RDW: 13.7 % (ref 11.5–15.5)
WBC: 9.6 10*3/uL (ref 4.0–10.5)
nRBC: 0 % (ref 0.0–0.2)

## 2021-05-05 LAB — MAGNESIUM: Magnesium: 1.8 mg/dL (ref 1.7–2.4)

## 2021-05-05 LAB — GLUCOSE, CAPILLARY
Glucose-Capillary: 212 mg/dL — ABNORMAL HIGH (ref 70–99)
Glucose-Capillary: 189 mg/dL — ABNORMAL HIGH (ref 70–99)
Glucose-Capillary: 160 mg/dL — ABNORMAL HIGH (ref 70–99)
Glucose-Capillary: 164 mg/dL — ABNORMAL HIGH (ref 70–99)

## 2021-05-05 LAB — C-REACTIVE PROTEIN: CRP: 11.1 mg/dL — ABNORMAL HIGH (ref <1.0)

## 2021-05-05 IMAGING — DX DG FOOT COMPLETE 3+V*R*
3 series · 3 of 3 positions shown · non-contrast
Comparison: Right foot radiographs [DATE]

CLINICAL DATA: Status post right foot partial amputation.

EXAM:
RIGHT FOOT COMPLETE - 3+ VIEW

[foot ap]
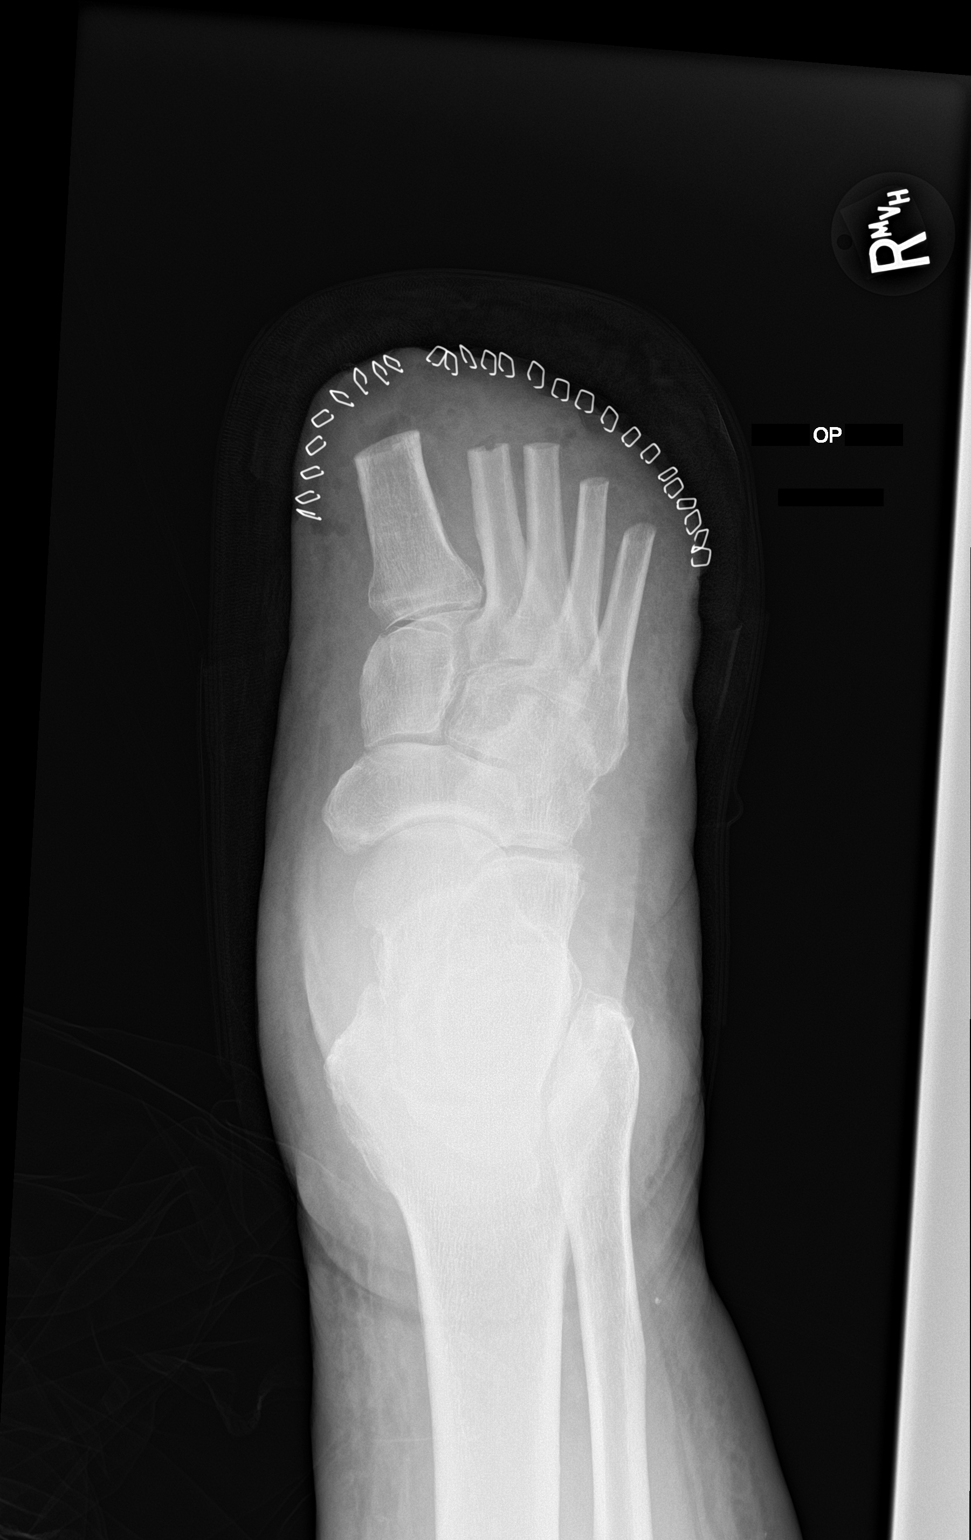

[foot obl]
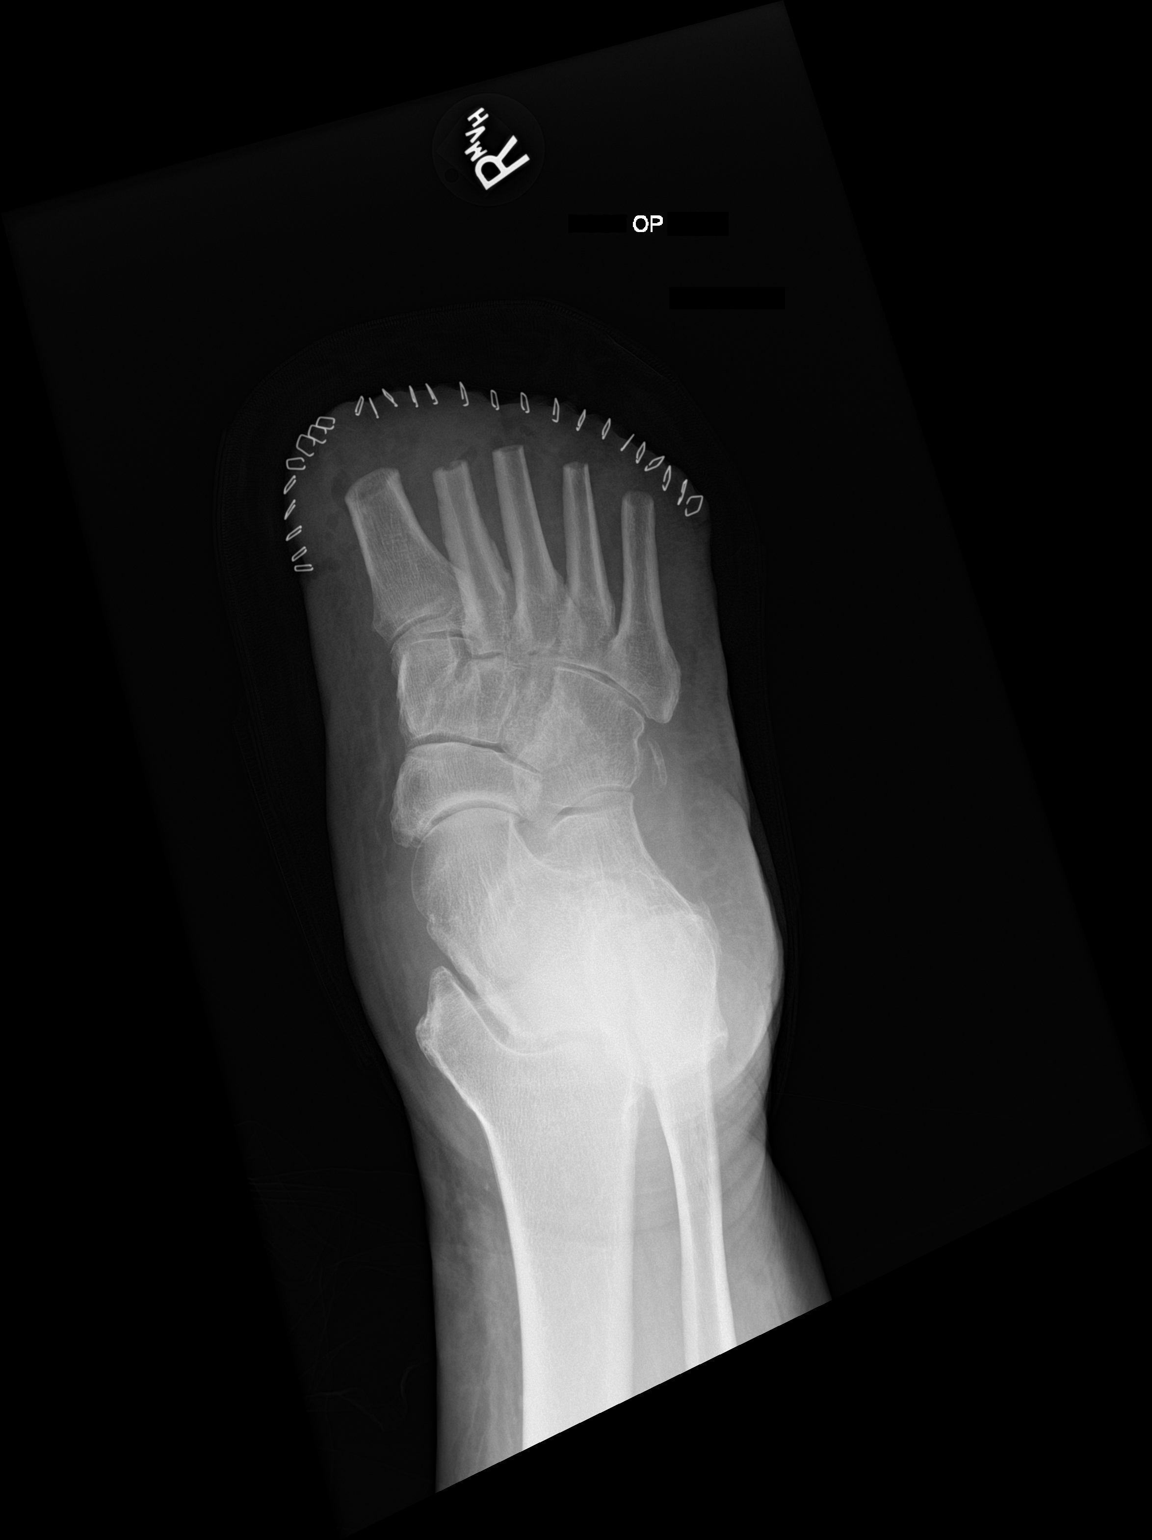

[foot lat]
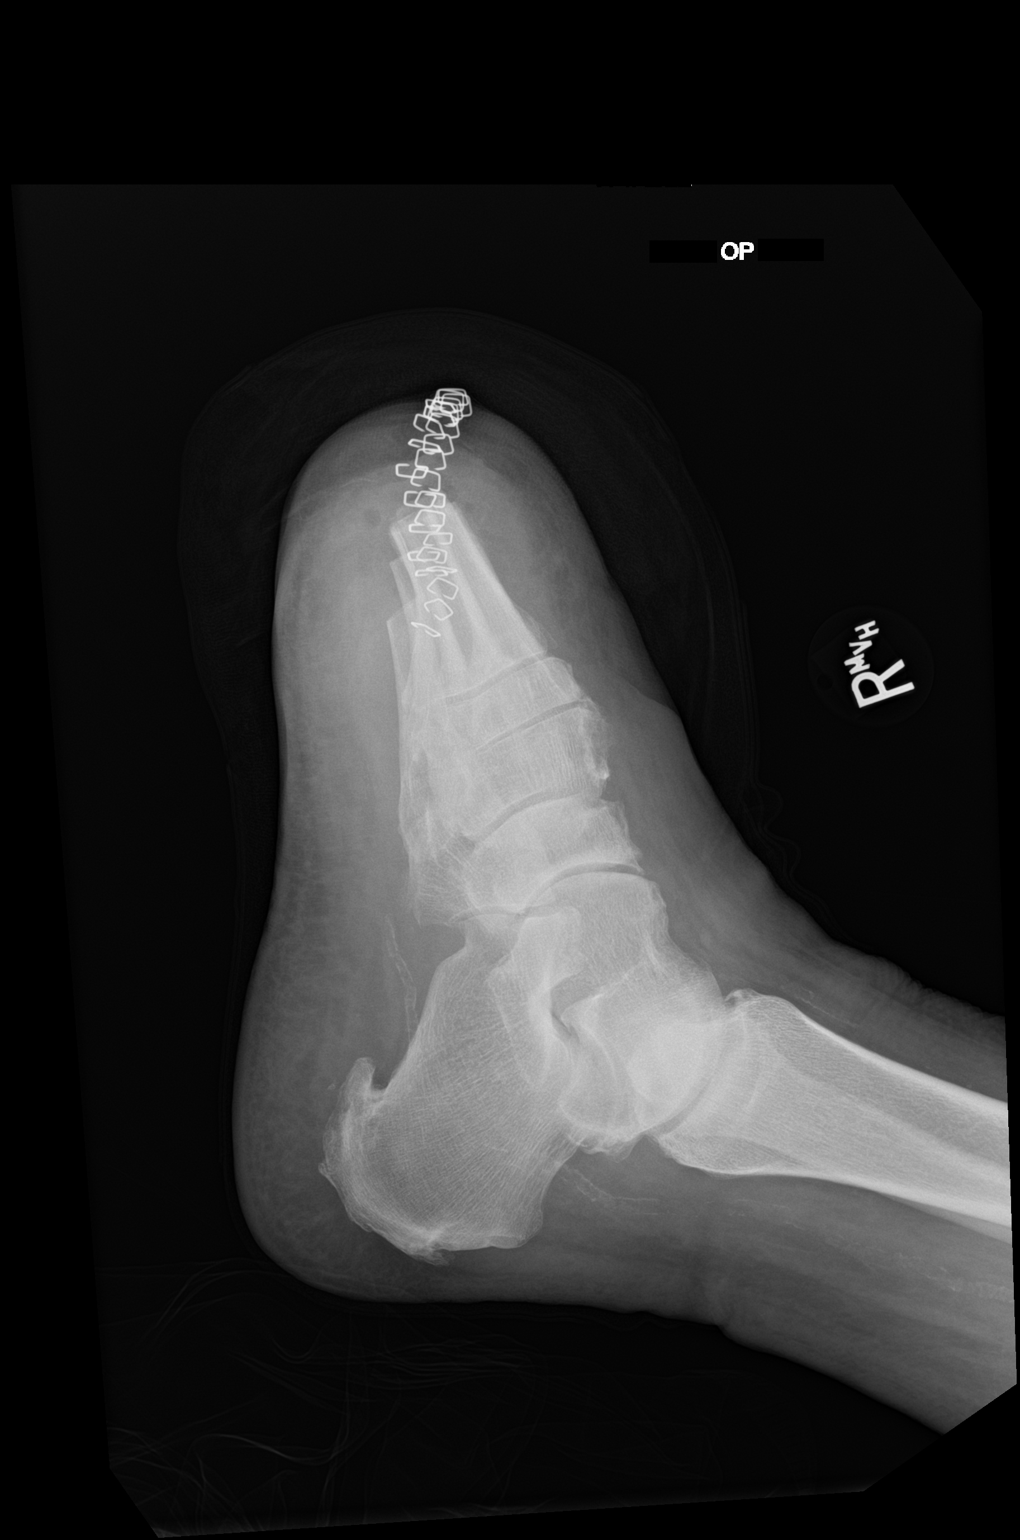

[3 of 3 positions shown; findings below may reference images not displayed]

FINDINGS: Interval slightly greater amputation of the first toe to the level
of the mid metatarsal shaft.

New amputation of second through fifth digits to the level of the
mid metatarsal shafts.

Distal post amputation soft tissue surgical skin staples with
scattered foci of expected postoperative air. No cortical erosion is
seen in the resection margins appear sharp.

Moderate plantar and mild Achilles insertion on the calcaneus spurs.

Vascular calcifications.
IMPRESSION: Interval first through fifth digit amputation to the level of the
mid metatarsal shafts. Normal postoperative findings.

## 2021-05-05 IMAGING — DX DG FOOT COMPLETE 3+V*L*
3 series · 3 of 3 positions shown · non-contrast
Comparison: Left foot radiographs [DATE]

CLINICAL DATA: Status post prior amputation.

EXAM:
LEFT FOOT - COMPLETE 3+ VIEW

[foot ap]
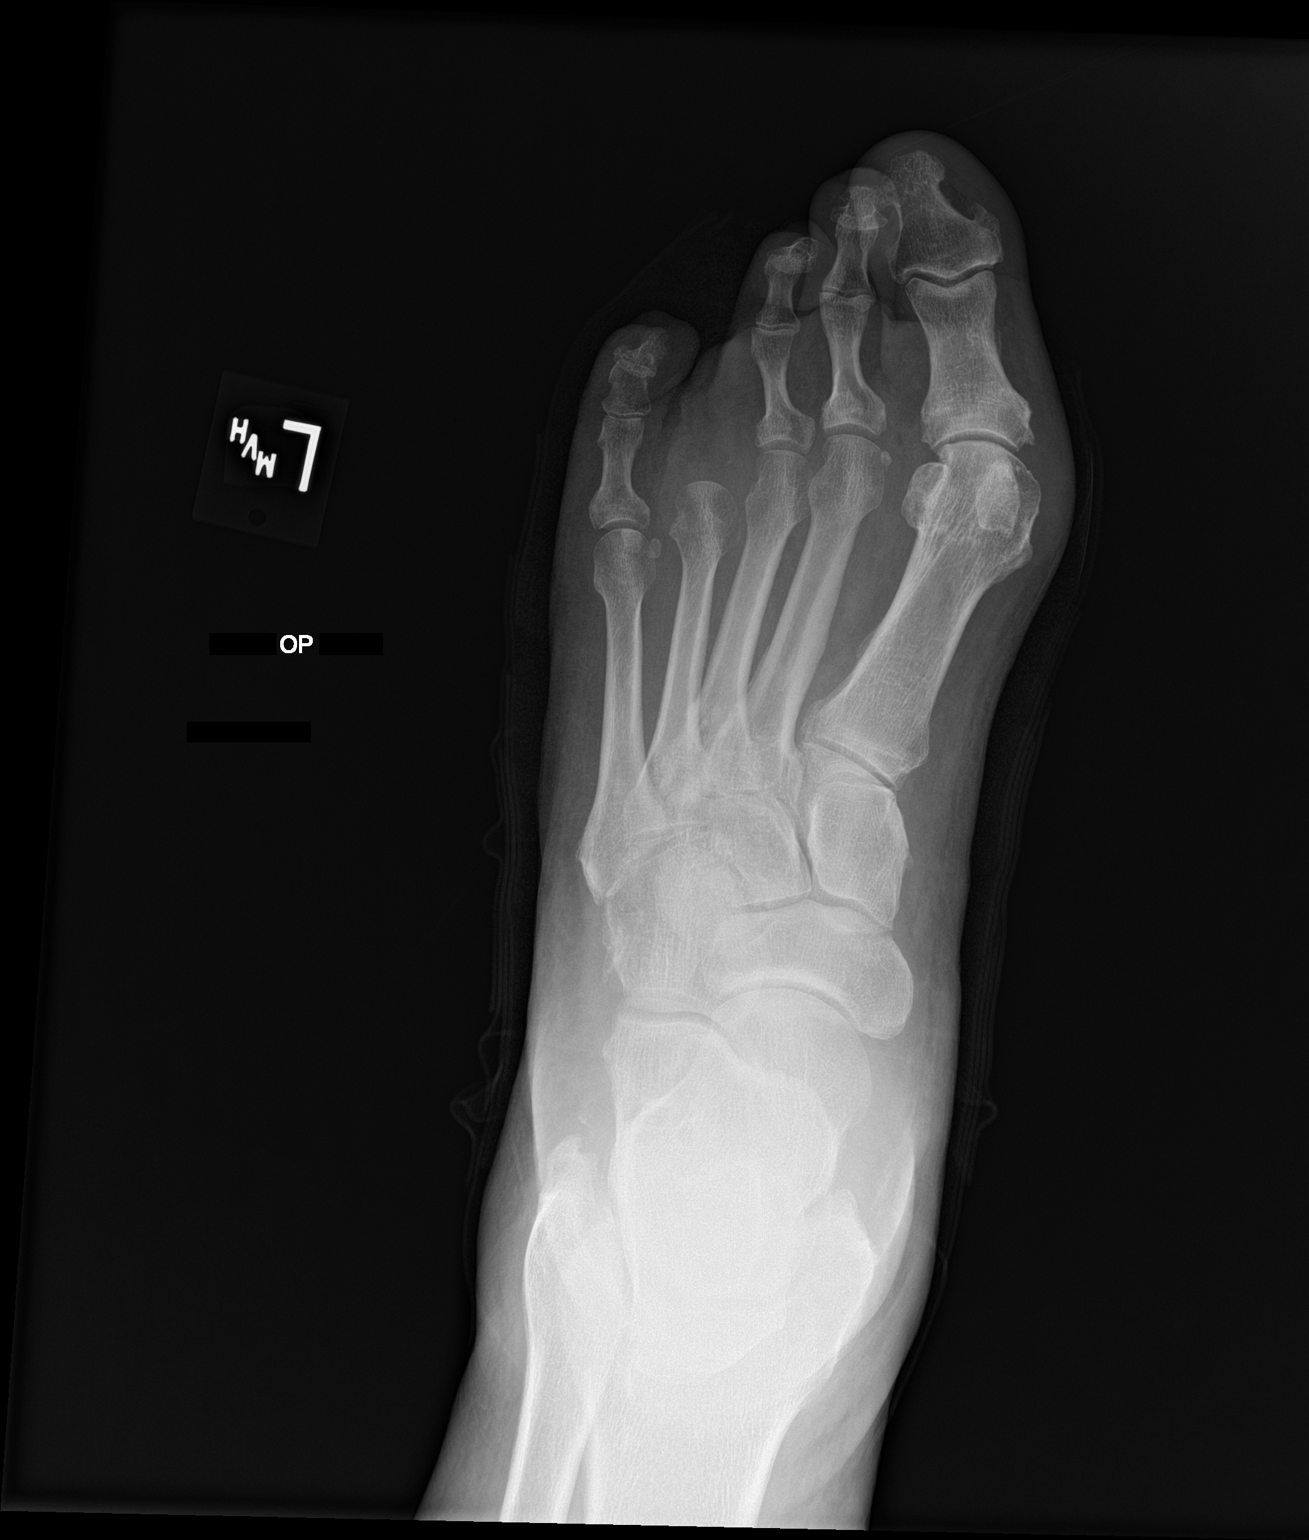

[foot obl]
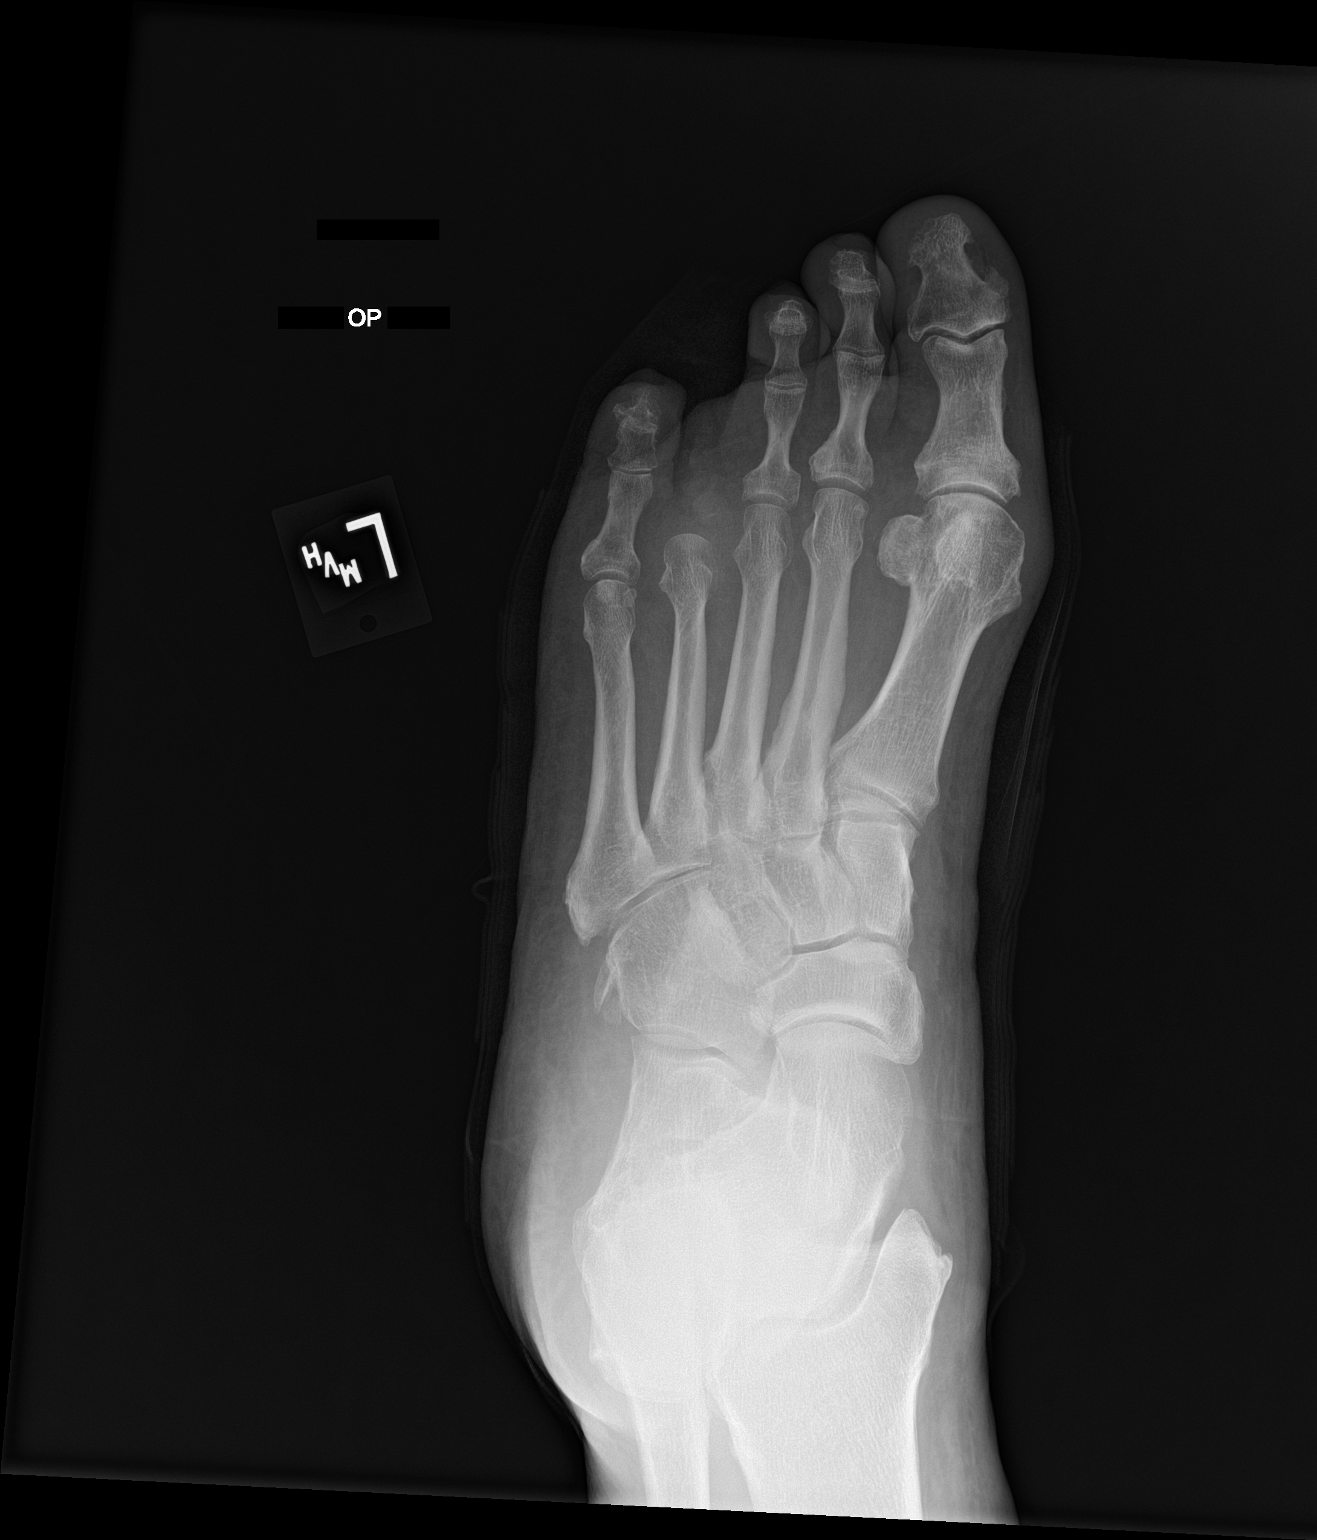

[foot lat]
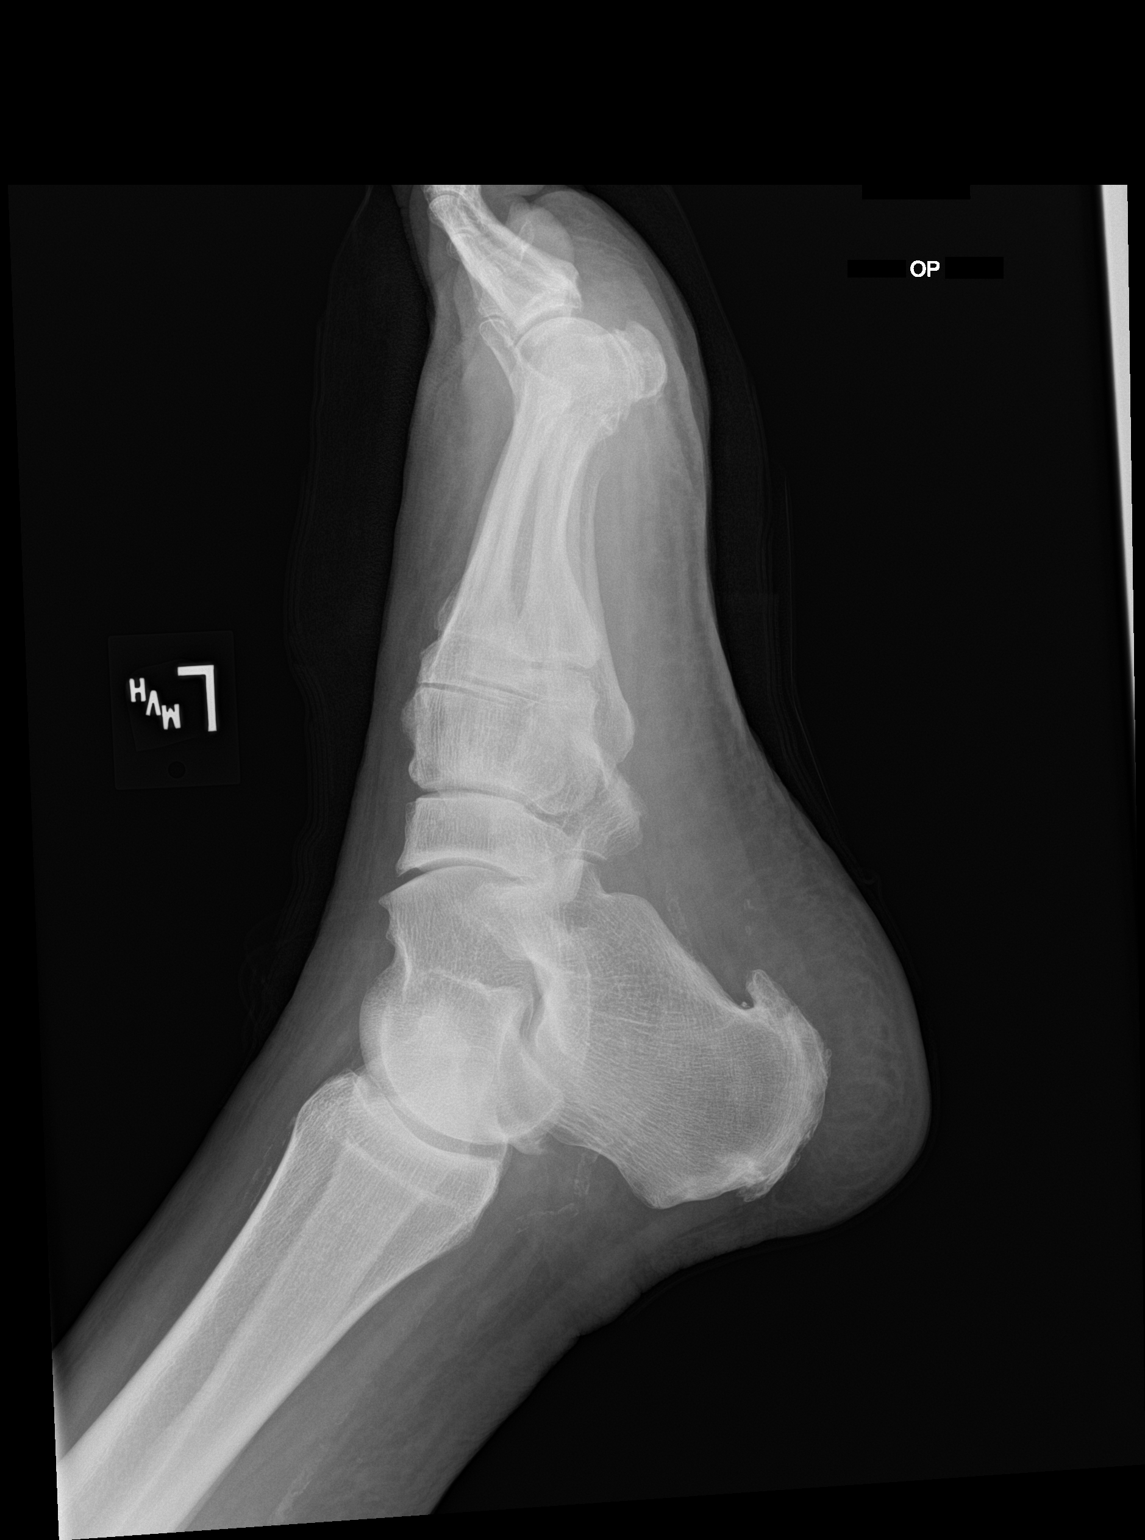

[3 of 3 positions shown; findings below may reference images not displayed]

FINDINGS: Interval amputation of the left fourth toe phalanges. Mild hallux
valgus and mild-to-moderate lateral greater than medial great toe
metatarsophalangeal joint osteoarthritis. Mild dorsal talonavicular
degenerative osteophytosis.

No cortical erosion is seen to indicate radiographic evidence of
acute osteomyelitis.

Moderate plantar calcaneal heel spur. Minimal chronic spurring at
the Achilles insertion on the calcaneus.

Mild forefoot soft tissue swelling.
IMPRESSION: 1. Interval amputation of the left fourth toe phalanges.
2. No evidence of cortical erosion.

## 2021-05-05 SURGERY — AMPUTATION, FOOT, PARTIAL
Anesthesia: Monitor Anesthesia Care | Site: Toe | Laterality: Right

## 2021-05-05 MED ORDER — LIDOCAINE HCL 2 % IJ SOLN
INTRAMUSCULAR | Status: DC | PRN
  Administered 2021-05-05: 17 mL

## 2021-05-05 MED ORDER — LIDOCAINE HCL (PF) 2 % IJ SOLN
INTRAMUSCULAR | Status: AC
  Filled 2021-05-05: qty 5

## 2021-05-05 MED ORDER — BUPIVACAINE-EPINEPHRINE 0.5% -1:200000 IJ SOLN
INTRAMUSCULAR | Status: AC
  Filled 2021-05-05: qty 1

## 2021-05-05 MED ORDER — ACETAMINOPHEN 500 MG PO TABS: 1000.0000 mg | ORAL_TABLET | Freq: Once | ORAL | Status: DC

## 2021-05-05 MED ORDER — KETAMINE HCL 10 MG/ML IJ SOLN
INTRAMUSCULAR | Status: AC
  Filled 2021-05-05: qty 1

## 2021-05-05 MED ORDER — CHLORHEXIDINE GLUCONATE CLOTH 2 % EX PADS
6.0000 | MEDICATED_PAD | Freq: Once | CUTANEOUS | Status: AC
  Administered 2021-05-05: 6 via TOPICAL

## 2021-05-05 MED ORDER — PROPOFOL 500 MG/50ML IV EMUL
INTRAVENOUS | Status: DC | PRN
  Administered 2021-05-05: 50 ug/kg/min via INTRAVENOUS

## 2021-05-05 MED ORDER — PROPOFOL 500 MG/50ML IV EMUL
INTRAVENOUS | Status: AC
  Filled 2021-05-05: qty 50

## 2021-05-05 MED ORDER — AMISULPRIDE (ANTIEMETIC) 5 MG/2ML IV SOLN: 10.0000 mg | Freq: Once | INTRAVENOUS | Status: DC | PRN

## 2021-05-05 MED ORDER — DEXAMETHASONE SODIUM PHOSPHATE 10 MG/ML IJ SOLN
INTRAMUSCULAR | Status: AC
  Filled 2021-05-05: qty 1

## 2021-05-05 MED ORDER — CHLORHEXIDINE GLUCONATE CLOTH 2 % EX PADS: 6.0000 | MEDICATED_PAD | Freq: Once | CUTANEOUS | Status: AC

## 2021-05-05 MED ORDER — PROPOFOL 10 MG/ML IV BOLUS
INTRAVENOUS | Status: AC
  Filled 2021-05-05: qty 20

## 2021-05-05 MED ORDER — PROPOFOL 10 MG/ML IV BOLUS
INTRAVENOUS | Status: DC | PRN
  Administered 2021-05-05: 40 mg via INTRAVENOUS

## 2021-05-05 MED ORDER — FENTANYL CITRATE PF 50 MCG/ML IJ SOSY: 25.0000 ug | PREFILLED_SYRINGE | INTRAMUSCULAR | Status: DC | PRN

## 2021-05-05 MED ORDER — LIDOCAINE HCL 2 % IJ SOLN
INTRAMUSCULAR | Status: AC
  Filled 2021-05-05: qty 20

## 2021-05-05 MED ORDER — LIDOCAINE 2% (20 MG/ML) 5 ML SYRINGE
INTRAMUSCULAR | Status: DC | PRN
  Administered 2021-05-05: 100 mg via INTRAVENOUS

## 2021-05-05 MED ORDER — FENTANYL CITRATE (PF) 100 MCG/2ML IJ SOLN
INTRAMUSCULAR | Status: AC
  Filled 2021-05-05: qty 2

## 2021-05-05 MED ORDER — ONDANSETRON HCL 4 MG/2ML IJ SOLN
INTRAMUSCULAR | Status: AC
  Filled 2021-05-05: qty 2

## 2021-05-05 MED ORDER — BUPIVACAINE HCL (PF) 0.5 % IJ SOLN
INTRAMUSCULAR | Status: AC
  Filled 2021-05-05: qty 30

## 2021-05-05 MED ORDER — FENTANYL CITRATE (PF) 100 MCG/2ML IJ SOLN
INTRAMUSCULAR | Status: DC | PRN
  Administered 2021-05-05: 50 ug via INTRAVENOUS
  Administered 2021-05-05 (×2): 25 ug via INTRAVENOUS

## 2021-05-05 MED ORDER — BUPIVACAINE HCL 0.5 % IJ SOLN
INTRAMUSCULAR | Status: DC | PRN
  Administered 2021-05-05: 17 mL

## 2021-05-05 MED ORDER — KETAMINE HCL 10 MG/ML IJ SOLN
INTRAMUSCULAR | Status: DC | PRN
Start: 2021-05-05 — End: 2021-05-05
  Administered 2021-05-05: 30 mg via INTRAVENOUS
  Administered 2021-05-05: 20 mg via INTRAVENOUS

## 2021-05-05 MED ORDER — LACTATED RINGERS IV SOLN: INTRAVENOUS | Status: DC

## 2021-05-05 SURGICAL SUPPLY — 73 items
BAG COUNTER SPONGE SURGICOUNT (BAG) IMPLANT
BAG DECANTER FOR FLEXI CONT (MISCELLANEOUS) IMPLANT
BAG SPNG CNTER NS LX DISP (BAG)
BLADE OSC/SAG .038X5.5 CUT EDG (BLADE) IMPLANT
BLADE OSC/SAGITTAL MD 5.5X18 (BLADE) IMPLANT
BLADE OSCIL/SAGITTAL W/10 ST (BLADE) IMPLANT
BLADE OSCILLATING/SAGITTAL (BLADE) ×3
BLADE SURG 15 STRL LF DISP TIS (BLADE) ×4 IMPLANT
BLADE SURG 15 STRL SS (BLADE) ×12
BLADE SURG SZ10 CARB STEEL (BLADE) ×6 IMPLANT
BLADE SW THK.38XMED LNG THN (BLADE) ×2 IMPLANT
BNDG CMPR 9X4 STRL LF SNTH (GAUZE/BANDAGES/DRESSINGS) ×2
BNDG COHESIVE 2X5 TAN NS LF (GAUZE/BANDAGES/DRESSINGS) ×3 IMPLANT
BNDG CONFORM 2 STRL LF (GAUZE/BANDAGES/DRESSINGS) ×3 IMPLANT
BNDG ELASTIC 4X5.8 VLCR STR LF (GAUZE/BANDAGES/DRESSINGS) ×4 IMPLANT
BNDG ELASTIC 6X5.8 VLCR STR LF (GAUZE/BANDAGES/DRESSINGS) ×3 IMPLANT
BNDG ESMARK 4X9 LF (GAUZE/BANDAGES/DRESSINGS) ×3 IMPLANT
BNDG GAUZE ELAST 4 BULKY (GAUZE/BANDAGES/DRESSINGS) ×3 IMPLANT
COVER BACK TABLE 60X90IN (DRAPES) ×3 IMPLANT
COVER SURGICAL LIGHT HANDLE (MISCELLANEOUS) ×3 IMPLANT
CUFF TOURN SGL QUICK 18X4 (TOURNIQUET CUFF) ×2 IMPLANT
CUFF TOURN SGL QUICK 34 (TOURNIQUET CUFF)
CUFF TRNQT CYL 34X4.125X (TOURNIQUET CUFF) ×2 IMPLANT
DRAPE EXTREMITY T 121X128X90 (DISPOSABLE) ×3 IMPLANT
DRAPE SHEET LG 3/4 BI-LAMINATE (DRAPES) ×3 IMPLANT
DRAPE SURG 17X23 STRL (DRAPES) ×3 IMPLANT
DRAPE U-SHAPE 47X51 STRL (DRAPES) ×3 IMPLANT
DRSG ADAPTIC 3X8 NADH LF (GAUZE/BANDAGES/DRESSINGS) ×3 IMPLANT
DRSG PAD ABDOMINAL 8X10 ST (GAUZE/BANDAGES/DRESSINGS) ×4 IMPLANT
DURAPREP 26ML APPLICATOR (WOUND CARE) ×1 IMPLANT
ELECT REM PT RETURN 15FT ADLT (MISCELLANEOUS) ×3 IMPLANT
FACESHIELD WRAPAROUND (MASK) ×6 IMPLANT
FACESHIELD WRAPAROUND OR TEAM (MASK) ×4 IMPLANT
GAUZE 4X4 16PLY ~~LOC~~+RFID DBL (SPONGE) ×3 IMPLANT
GAUZE SPONGE 4X4 12PLY STRL (GAUZE/BANDAGES/DRESSINGS) ×3 IMPLANT
GAUZE XEROFORM 1X8 LF (GAUZE/BANDAGES/DRESSINGS) ×3 IMPLANT
GLOVE BIO SURGEON STRL SZ7.5 (GLOVE) ×3 IMPLANT
GLOVE BIOGEL PI IND STRL 7.5 (GLOVE) ×2 IMPLANT
GLOVE BIOGEL PI INDICATOR 7.5 (GLOVE) ×1
GOWN STRL REUS W/ TWL LRG LVL3 (GOWN DISPOSABLE) ×2 IMPLANT
GOWN STRL REUS W/TWL LRG LVL3 (GOWN DISPOSABLE) ×3
KIT BASIN OR (CUSTOM PROCEDURE TRAY) ×3 IMPLANT
KIT TURNOVER CYSTO (KITS) ×3 IMPLANT
KIT TURNOVER KIT A (KITS) IMPLANT
MANIFOLD NEPTUNE II (INSTRUMENTS) ×3 IMPLANT
NEEDLE HYPO 22GX1.5 SAFETY (NEEDLE) ×4 IMPLANT
NS IRRIG 1000ML POUR BTL (IV SOLUTION) ×3 IMPLANT
PACK ORTHO EXTREMITY (CUSTOM PROCEDURE TRAY) ×3 IMPLANT
PADDING CAST ABS 4INX4YD NS (CAST SUPPLIES)
PADDING CAST ABS COTTON 4X4 ST (CAST SUPPLIES) ×2 IMPLANT
PENCIL SMOKE EVACUATOR (MISCELLANEOUS) ×3 IMPLANT
PROTECTOR NERVE ULNAR (MISCELLANEOUS) ×2 IMPLANT
SPIKE FLUID TRANSFER (MISCELLANEOUS) IMPLANT
SPONGE T-LAP 4X18 ~~LOC~~+RFID (SPONGE) ×1 IMPLANT
STAPLER VISISTAT (STAPLE) ×3 IMPLANT
STOCKINETTE 6  STRL (DRAPES) ×3
STOCKINETTE 6 STRL (DRAPES) ×2 IMPLANT
STOCKINETTE 8 INCH (MISCELLANEOUS) ×3 IMPLANT
STRIP CLOSURE SKIN 1/4X4 (GAUZE/BANDAGES/DRESSINGS) IMPLANT
SUCTION FRAZIER HANDLE 10FR (MISCELLANEOUS) ×3
SUCTION TUBE FRAZIER 10FR DISP (MISCELLANEOUS) IMPLANT
SUT ETHILON 3 0 PS 1 (SUTURE) ×2 IMPLANT
SUT ETHILON 4 0 PS 2 18 (SUTURE) IMPLANT
SUT ETHILON 5 0 PS 2 18 (SUTURE) IMPLANT
SUT PROLENE 3 0 PS 2 (SUTURE) ×9 IMPLANT
SYR 10ML ECCENTRIC (SYRINGE) IMPLANT
SYR 10ML LL (SYRINGE) ×1 IMPLANT
SYR BULB EAR ULCER 3OZ GRN STR (SYRINGE) ×3 IMPLANT
SYR CONTROL 10ML LL (SYRINGE) ×3 IMPLANT
TOWEL OR 17X26 10 PK STRL BLUE (TOWEL DISPOSABLE) ×2 IMPLANT
TUBE CONNECTING 12X1/4 (SUCTIONS) ×1 IMPLANT
UNDERPAD 30X36 HEAVY ABSORB (UNDERPADS AND DIAPERS) ×3 IMPLANT
WATER STERILE IRR 500ML POUR (IV SOLUTION) ×3 IMPLANT

## 2021-05-05 NOTE — Plan of Care (Signed)

## 2021-05-05 NOTE — Progress Notes (Signed)
NUTRITION NOTE ? ?RD consulted for nutrition education regarding diabetes.  ? ?Lab Results  ?Component Value Date  ? HGBA1C 9.4 (H) 05/04/2021  ? ? ?RD provided "Carbohydrate Counting for People with Diabetes" and "Label Reading Tips for Diabetes" handouts from the Academy of Nutrition and Dietetics. Discussed different food groups and their effects on blood sugar, emphasizing carbohydrate-containing foods. Provided list of carbohydrates and recommended serving sizes of common foods. ? ?Discussed importance of controlled and consistent carbohydrate intake throughout the day. Provided examples of ways to balance meals/snacks and encouraged intake of high-fiber, whole grain complex carbohydrates. Teach back method used. ? ?Expect good compliance. ? ?Body mass index is 37.97 kg/m?Marland Kitchen Pt meets criteria for obesity based on current BMI. ? ?Current diet order is NPO pending OR visit for toe amputation. He was previously on Heart Healthy/Carb Modified diet and ate 100% of dinner last night. ? ?Labs and medications reviewed.  ? ?No further nutrition interventions warranted at this time. RD contact information provided. If additional nutrition issues arise, please re-consult RD. ? ? ? ? ? ?Jarome Matin, MS, RD, LDN ?Registered Dietitian II ?Inpatient Clinical Nutrition ?RD pager # and on-call/weekend pager # available in [place]  ? ? ?

## 2021-05-05 NOTE — Interval H&P Note (Signed)
History and Physical Interval Note: ? ?05/05/2021 ?3:49 PM ? ?Robert Santiago  has presented today for surgery, with the diagnosis of Right foot osteomyelities, left fourth digit osteomyelitis.  The various methods of treatment have been discussed with the patient and family. After consideration of risks, benefits and other options for treatment, the patient has consented to  Procedure(s) with comments: ?AMPUTATION FOOT (Right) - Surgeon will do local block ?AMPUTATION TOE (Left) - Surgeon will do local block as a surgical intervention.  The patient's history has been reviewed, patient examined, no change in status, stable for surgery.  I have reviewed the patient's chart and labs.  Questions were answered to the patient's satisfaction.   ? ? ?Lorenda Peck ? ? ?

## 2021-05-05 NOTE — Brief Op Note (Signed)
05/05/2021 ? ?5:03 PM ? ?PATIENT:  Robert Santiago  71 y.o. male ? ?PRE-OPERATIVE DIAGNOSIS:  Right foot osteomyelities, left fourth digit osteomyelitis ? ?POST-OPERATIVE DIAGNOSIS:  Right foot osteomyelities, left fourth digit osteomyelitis ? ?PROCEDURE:  Procedure(s) with comments: ?AMPUTATION FOOT (Right) - Surgeon will do local block ?AMPUTATION TOE (Left) - Surgeon will do local block ? ?SURGEON:  Surgeon(s) and Role: ?   Lorenda Peck, MD - Primary ? ?PHYSICIAN ASSISTANT:  ? ?ASSISTANTS: none  ? ?ANESTHESIA:   local and MAC ? ?EBL:  20 cc   ? ?BLOOD ADMINISTERED:none ? ?DRAINS: none  ? ?LOCAL MEDICATIONS USED:  MARCAINE   , LIDOCAINE , and Amount: 30 ml ? ?SPECIMEN:  Source of Specimen:  Right forefoot and left fourth digit  ? ?DISPOSITION OF SPECIMEN:   pathology and culture  ? ?COUNTS:  YES ? ?TOURNIQUET:  * No tourniquets in log * ? ?DICTATION: .Note written in EPIC ? ?PLAN OF CARE: Admit to inpatient  ? ?PATIENT DISPOSITION:  PACU - hemodynamically stable. ?  ?Delay start of Pharmacological VTE agent (>24hrs) due to surgical blood loss or risk of bleeding: yes ? ?

## 2021-05-05 NOTE — Transfer of Care (Signed)
Immediate Anesthesia Transfer of Care Note ? ?Patient: Robert Santiago ? ?Procedure(s) Performed: AMPUTATION FOOT (Right: Foot) ?AMPUTATION TOE (Left: Toe) ? ?Patient Location: PACU ? ?Anesthesia Type:MAC ? ?Level of Consciousness: awake, alert  and oriented ? ?Airway & Oxygen Therapy: Patient Spontanous Breathing and Patient connected to face mask ? ?Post-op Assessment: Report given to RN and Post -op Vital signs reviewed and stable ? ?Post vital signs: Reviewed and stable ? ?Last Vitals:  ?Vitals Value Taken Time  ?BP 99/66 05/05/21 1715  ?Temp    ?Pulse 82 05/05/21 1717  ?Resp 16 05/05/21 1718  ?SpO2 100 % 05/05/21 1717  ?Vitals shown include unvalidated device data. ? ?Last Pain:  ?Vitals:  ? 05/05/21 1540  ?TempSrc: Oral  ?PainSc:   ?   ? ?Patients Stated Pain Goal: 2 (05/04/21 2029) ? ?Complications: No notable events documented. ?

## 2021-05-05 NOTE — Progress Notes (Signed)
?PROGRESS NOTE ? ? ? ?Robert Santiago  VWU:[id number] DOB: 05/31/1950 DOA: 05/04/2021 ?PCP: Cyndi Bender, PA-C  ? ?Brief Narrative:  ? ?71 year old male with history of CAD status post CABG, hypertension, hyperlipidemia, diabetes mellitus type 2 with polyneuropathy, GERD, BPH, hypothyroidism, multiple ulcerations of bilateral lower extremities with osteomyelitis of left fourth toe as well as history of right great toe osteomyelitis status post right great toe amputation on 10/18/2020 presented with worsening redness of the right leg and x-ray imaging suspicious for developing osteomyelitis with possible abscess formation.  Patient was transferred from [facility] for podiatry evaluation.  He was started on broad-spectrum antibiotics. ? ?Assessment & Plan: ?  ?Right foot osteomyelitis of second and third metatarsals ?Left fourth digit osteomyelitis ?Right leg cellulitis ?-MRI of bilateral lower extremity showing osteomyelitis as above.  Podiatry following and planning for surgical intervention today.  Continue broad-spectrum antibiotics. ? ?CAD with history of CABG ?Hyperlipidemia ?-Currently chest pain-free.  Hold aspirin and Plavix for surgery today.  Continue statin, ezetimibe, metoprolol ? ?Diabetes mellitus type 2 with hyperglycemia and diabetic polyneuropathy ?-Continue long-acting insulin along with CBGs with SSI and gabapentin ?-A1c 9.4 ? ?Anemia of chronic disease ?-Hemoglobin stable ? ?Hypomagnesemia ?-Resolved ? ?Hypothyroidism ?-Continue levothyroxine ? ?GERD ?-Continue Protonix ? ?BPH ?-Continue Flomax ? ?Anxiety/depression ?-Continue duloxetine and buspirone ? ?DVT prophylaxis: Hold Lovenox because of need for surgical intervention ?Code Status: Full ?Family Communication: None at bedside ?Disposition Plan: ?Status is: Inpatient ?Remains inpatient appropriate because: Of severity of illness ? ? ? ?Consultants: Podiatry ? ?Procedures: None ? ?Antimicrobials: Cefepime, Flagyl and vancomycin from  05/03/2021 onwards ? ? ?Subjective: ?Patient seen and examined at bedside.  Denies fever, nausea, vomiting.  Complains of intermittent lower extremity pain. ? ?Objective: ?Vitals:  ? 05/04/21 1627 05/04/21 2131 05/05/21 0131 05/05/21 0616  ?BP: (!) 155/85 (!) 143/81 127/65 (!) 160/81  ?Pulse: 68 75 71 79  ?Resp: '14 17 17 17  '$ ?Temp: 98.2 ?F (36.8 ?C) 98.5 ?F (36.9 ?C) 99.2 ?F (37.3 ?C) 98.4 ?F (36.9 ?C)  ?TempSrc:  Oral Oral Oral  ?SpO2: 100% 98% 98% 98%  ?Weight:      ?Height:      ? ? ?Intake/Output Summary (Last 24 hours) at 05/05/2021 0801 ?Last data filed at 05/05/2021 0532 ?Gross per 24 hour  ?Intake 1924.71 ml  ?Output 225 ml  ?Net 1699.71 ml  ? ?Filed Weights  ? 05/04/21 1002  ?Weight: 127 kg  ? ? ?Examination: ? ?General exam: Appears calm and comfortable.  Currently on room air. ?Respiratory system: Bilateral decreased breath sounds at bases ?Cardiovascular system: S1 & S2 heard, Rate controlled ?Gastrointestinal system: Abdomen is nondistended, soft and nontender. Normal bowel sounds heard. ?Extremities: No cyanosis, clubbing; trace lower extremity edema present.  Right lower extremity dressing present ?Central nervous system: Alert and oriented. No focal neurological deficits. Moving extremities ?Skin: No obvious ecchymosis/petechiae ?Psychiatry: Judgement and insight appear normal. Mood & affect appropriate.  ? ? ? ?Data Reviewed: I have personally reviewed following labs and imaging studies ? ?CBC: ?Recent Labs  ?Lab 05/04/21 ?2956 05/05/21 ?0325  ?WBC 9.3 9.6  ?NEUTROABS 6.5 6.8  ?HGB 12.9* 12.2*  ?HCT 39.5 36.1*  ?MCV 90.4 88.7  ?PLT 289 302  ? ?Basic Metabolic Panel: ?Recent Labs  ?Lab 05/04/21 ?2130 05/05/21 ?0325  ?NA 135 136  ?K 4.0 3.9  ?CL 101 104  ?CO2 27 25  ?GLUCOSE 242* 214*  ?BUN 15 15  ?CREATININE 0.92 0.85  ?CALCIUM 9.0 8.9  ?MG 1.6* 1.8  ? ?  GFR: ?Estimated Creatinine Clearance: 111.4 mL/min (by C-G formula based on SCr of 0.85 mg/dL). ?Liver Function Tests: ?Recent Labs  ?Lab 05/04/21 ?0333   ?AST 15  ?ALT 18  ?ALKPHOS 66  ?BILITOT 0.4  ?PROT 7.3  ?ALBUMIN 3.2*  ? ?No results for input(s): LIPASE, AMYLASE in the last 168 hours. ?No results for input(s): AMMONIA in the last 168 hours. ?Coagulation Profile: ?No results for input(s): INR, PROTIME in the last 168 hours. ?Cardiac Enzymes: ?No results for input(s): CKTOTAL, CKMB, CKMBINDEX, TROPONINI in the last 168 hours. ?BNP (last 3 results) ?No results for input(s): PROBNP in the last 8760 hours. ?HbA1C: ?Recent Labs  ?  05/04/21 ?0333  ?HGBA1C 9.4*  ? ?CBG: ?Recent Labs  ?Lab 05/04/21 ?0727 05/04/21 ?1154 05/04/21 ?1657 05/04/21 ?2134 05/05/21 ?6734  ?GLUCAP 181* 241* 187* 227* 212*  ? ?Lipid Profile: ?No results for input(s): CHOL, HDL, LDLCALC, TRIG, CHOLHDL, LDLDIRECT in the last 72 hours. ?Thyroid Function Tests: ?No results for input(s): TSH, T4TOTAL, FREET4, T3FREE, THYROIDAB in the last 72 hours. ?Anemia Panel: ?No results for input(s): VITAMINB12, FOLATE, FERRITIN, TIBC, IRON, RETICCTPCT in the last 72 hours. ?Sepsis Labs: ?Recent Labs  ?Lab 05/04/21 ?1937 05/04/21 ?0737  ?PROCALCITON <0.10  --   ?LATICACIDVEN 1.2 0.8  ? ? ?Recent Results (from the past 240 hour(s))  ?WOUND CULTURE     Status: Abnormal  ? Collection Time: 04/26/21  1:18 PM  ? Specimen: Foot, Right; Wound  ? Wound Culture and sens  ?Result Value Ref Range Status  ? Gram Stain Result Final report  Final  ? Organism ID, Bacteria Comment  Final  ?  Comment: No white blood cells seen.  ? Organism ID, Bacteria Comment  Final  ?  Comment: Moderate gram negative rods.  ? Aerobic Bacterial Culture Final report (A)  Final  ? Organism ID, Bacteria Comment (A)  Final  ?  Comment: Pseudomonas aeruginosa ?Multi-Drug Resistant Organism ?Heavy growth ?  ? Organism ID, Bacteria Morganella morganii (A)  Final  ?  Comment: Heavy growth  ? Organism ID, Bacteria Mixed skin flora  Final  ?  Comment: Scant growth  ? Antimicrobial Susceptibility Comment  Final  ?  Comment:       ** S = Susceptible; I =  Intermediate; R = Resistant ** ?                   P = Positive; N = Negative ?            MICS are expressed in micrograms per mL ?   Antibiotic                 RSLT#1    RSLT#2    RSLT#3    RSLT#4 ?Amikacin                       S ?Cefepime                       S         S ?Ceftazidime                    S ?Ceftriaxone                              S ?Ciprofloxacin                  R  S ?Gentamicin                     S         S ?Imipenem                       S ?Levofloxacin                   R         S ?Meropenem                      I         S ?Piperacillin                   S ?Tetracycline                             R ?Ticarcillin                    R ?Tobramycin                     S         S ?Trimethoprim/Sulfa                       S ?  ?Culture, blood (Routine X 2) w Reflex to ID Panel     Status: None (Preliminary result)  ? Collection Time: 05/04/21  3:33 AM  ? Specimen: BLOOD  ?Result Value Ref Range Status  ? Specimen Description   Final  ?  BLOOD OUTSIDE SERVICE OPLAB ?Performed at [facility], [place] [address], [place], [place] [address] ?  ? Special Requests   Final  ?  BOTTLES DRAWN AEROBIC ONLY Blood Culture adequate volume ?Performed at [facility], [place] [address], [place], [place] [address] ?  ? Culture   Final  ?  NO GROWTH 1 DAY ?Performed at [facility], [place] [address] [place], [place] [address] ?  ? Report Status PENDING  Incomplete  ?Culture, blood (Routine X 2) w Reflex to ID Panel     Status: None (Preliminary result)  ? Collection Time: 05/04/21  3:33 AM  ? Specimen: BLOOD  ?Result Value Ref Range Status  ? Specimen Description   Final  ?  BLOOD RIGHT ANTECUBITAL ?Performed at [facility], [place] [address], [place], [place] [address] ?  ? Special Requests   Final  ?  BOTTLES DRAWN AEROBIC ONLY Blood Culture adequate volume ?Performed at [facility], [place] [address], [place], [place] [address] ?   ? Culture   Final  ?  NO GROWTH < 24 HOURS ?Performed at [facility], [place] [address], [place], [place] [address] ?  ? Report Status PENDING  Incomplete  ?  ? ? ? ? ? ?Radiology Studies: ?MR FOOT

## 2021-05-05 NOTE — Anesthesia Preprocedure Evaluation (Signed)
Anesthesia Evaluation  ?Patient identified by MRN, date of birth, ID band ?Patient awake ? ? ? ?Reviewed: ?Allergy & Precautions, NPO status , Patient's Chart, lab work & pertinent test results ? ?Airway ?Mallampati: II ? ?TM Distance: >3 FB ?Neck ROM: Full ? ? ? Dental ? ?(+) Dental Advisory Given ?  ?Pulmonary ?former smoker,  ?  ?breath sounds clear to auscultation ? ? ? ? ? ? Cardiovascular ?hypertension, Pt. on medications and Pt. on home beta blockers ?+ CAD and + CABG  ? ?Rhythm:Regular Rate:Normal ? ? ?  ?Neuro/Psych ? Neuromuscular disease   ? GI/Hepatic ?Neg liver ROS, GERD  ,  ?Endo/Other  ?diabetesHypothyroidism  ? Renal/GU ?negative Renal ROS  ? ?  ?Musculoskeletal ? ? Abdominal ?  ?Peds ? Hematology ? ?(+) Blood dyscrasia, anemia ,   ?Anesthesia Other Findings ? ? Reproductive/Obstetrics ? ?  ? ? ? ? ? ? ? ? ? ? ? ? ? ?  ?  ? ? ? ? ? ? ? ? ?Anesthesia Physical ?Anesthesia Plan ? ?ASA: 3 ? ?Anesthesia Plan: MAC  ? ?Post-op Pain Management: Tylenol PO (pre-op)* and Toradol IV (intra-op)*  ? ?Induction:  ? ?PONV Risk Score and Plan: 1 and Propofol infusion, Ondansetron and Treatment may vary due to age or medical condition ? ?Airway Management Planned: Natural Airway and Simple Face Mask ? ?Additional Equipment:  ? ?Intra-op Plan:  ? ?Post-operative Plan:  ? ?Informed Consent: I have reviewed the patients History and Physical, chart, labs and discussed the procedure including the risks, benefits and alternatives for the proposed anesthesia with the patient or authorized representative who has indicated his/her understanding and acceptance.  ? ? ? ?Dental advisory given ? ?Plan Discussed with: CRNA ? ?Anesthesia Plan Comments:   ? ? ? ? ? ? ?Anesthesia Quick Evaluation ? ?

## 2021-05-05 NOTE — Op Note (Signed)
? ?OPERATIVE REPORT ?Patient name: Robert Santiago ?MRN: [id number] ?DOB: 01-Nov-1950 ? ?DOS:  05/05/21 ? ?Preop Dx: Right foot osteomyelitis, left fourth digit osteomyelitis  ?Postop Dx: same ? ?Procedure:  ?1. Right transmetatarsal amputation  ?2. Left fourth digit amputation  ? ?Surgeon: Lorenda Peck, DPM ? ?Anesthesia: 50-50 mixture of 2% lidocaine plain with 0.5% Marcaine plain totaling 30 infiltrated in the patient's bilateral lower extremity ? ?Hemostasis: Ankle tourniquet inflated to a pressure of 228mHg after esmarch exsanguination  ? ?EBL: 20 mL ?Materials: None ?Injectables: as above ?Pathology: Right forefoot and left fourth digit.  ? ?Condition: The patient tolerated the procedure and anesthesia well. No complications noted or reported ? ? ?Justification for procedure: The patient is a 71y.o. malewho presents today for osteomeyelitis or left fourth digit and right forefoot.  All conservative modalities of been unsuccessful in providing any sort of satisfactory alleviation of symptoms with the patient. The patient was told benefits as well as possible side effects of the surgery. The patient consented for surgical correction. The patient consent form was reviewed. All patient questions were answered. No guarantees were expressed or implied. The patient and the surgeon both signed the patient consent form with the witness present and placed in the patient's chart. ? ? ?Procedure in Detail: The patient was brought to the operating room, placed in the operating table in the supine position at which time an aseptic scrub and drape were performed about the patient's respective lower extremity after anesthesia was induced as described above. Attention was then directed to the surgical area where procedure number one commenced. ? ?Procedure #1 ?After suitable general anesthesia, the patient's right lower extremity, with a escmarch cuff in place, was prepped and draped in the usual sterile fashion. Attention  was directed to the operative extremity with visible site marking. Incision was planned with skin marker. Dorsal incision was then made with a 15 blade through skin and subcutaneous tissue down to bone. Electrocautery was used to ligate any bleeders. Incision was then made along the plantar aspect of the foot careful to ligate any bleeders with electrocautery. Next, the bony cuts were made using sagittal saw, and these were made approximately at the mid level of the metatarsals and in a cascade fashion, transecting all of the 5 metatarsal bones. Next, the amputated forefoot was then removed. Following this, the wound was thoroughly irrigated and then clean area of bone was biopsied for culture. Hemostasis was then obtained After full hemostasis had been obtained, the wound was again thoroughly irrigated and then the dorsal plantar flap was further tailored so that the plantar flap was lying in a dorsal direction. The skin  was closed with interrupted sutures using 3-0 Nylon in a vertical and horizontal mattress fashion and staples. An adaptic and a bulky foot dressing were applied. ? ?Procedure #2:  ?Attention was directed to the fourth left digit were an incision was preformed encircling the base of the toe. Incision was made down to bone and digit was amputated at the level of the metatarsophalangeal join. After the toe was disarticulated it was passed off to the back table to be sent to pathology for further evaluation. The extensor and flexor tendons were identified and resected as far proximally as possible. Any necrotic tissue was removed to healthy bleeding tissue. The metatarsal head was identified and noted to be hard. The area was irrigated copiously sterile saline and residual bone was sent to microbiology. Any bleeders noted were cauterized as necessary. The skin  was re-approximated utilizing 3-0  prolene suture.  ? ? ?Dry sterile compressive dressings were then applied to all previously mentioned  incision sites about the patient's lower extremity. The tourniquet which was used for hemostasis was deflated. All normal neurovascular responses including pink color and warmth returned all the digits of patient's lower extremity. ? ?The patient was then transferred from the operating room to the recovery room having tolerated the procedure and anesthesia well. All vital signs are stable.  ? ?Patient will be ok for discharge from podiatry standpoint will likely need two weeks oral antibiotics on discharge and will follow-up in clinic with [place]. Will keep dressing clean and intact.   ? ?Lorenda Peck, DPM ?[place] ? ?Dr. Lorenda Peck, DPM  ?  ?[address]                                       ?[place], [place] [address]                ?Office [phone]  ?Fax [phone] ? ? ?

## 2021-05-05 NOTE — Anesthesia Procedure Notes (Signed)
Date/Time: 05/05/2021 4:00 PM ?Performed by: Sharlette Dense, CRNA ?Oxygen Delivery Method: Simple face mask ? ? ? ? ?

## 2021-05-06 ENCOUNTER — Other Ambulatory Visit: Payer: Self-pay | Admitting: Podiatry

## 2021-05-06 DIAGNOSIS — I10 Essential (primary) hypertension: Secondary | ICD-10-CM | POA: Diagnosis not present

## 2021-05-06 DIAGNOSIS — L03115 Cellulitis of right lower limb: Secondary | ICD-10-CM | POA: Diagnosis not present

## 2021-05-06 DIAGNOSIS — I2510 Atherosclerotic heart disease of native coronary artery without angina pectoris: Secondary | ICD-10-CM | POA: Diagnosis not present

## 2021-05-06 DIAGNOSIS — M869 Osteomyelitis, unspecified: Secondary | ICD-10-CM | POA: Diagnosis not present

## 2021-05-06 LAB — GLUCOSE, CAPILLARY
Glucose-Capillary: 151 mg/dL — ABNORMAL HIGH (ref 70–99)
Glucose-Capillary: 218 mg/dL — ABNORMAL HIGH (ref 70–99)
Glucose-Capillary: 170 mg/dL — ABNORMAL HIGH (ref 70–99)
Glucose-Capillary: 203 mg/dL — ABNORMAL HIGH (ref 70–99)

## 2021-05-06 MED ORDER — OXYCODONE-ACETAMINOPHEN 5-325 MG PO TABS: 1.0000 | ORAL_TABLET | ORAL | 0 refills | Status: AC | PRN

## 2021-05-06 NOTE — TOC Progression Note (Signed)
Transition of Care (TOC) - Progression Note  ? ? ?Patient Details  ?Name: Robert Santiago ?MRN: [id number] ?Date of Birth: [birth year] ? ?Transition of Care (TOC) CM/SW Contact  ?Ross Ludwig, LCSW ?Phone Number: ?05/06/2021, 10:32 AM ? ?Clinical Narrative:    ? ?CSW following patient, awaiting PT recommendations. ? ? ?  ?Barriers to Discharge: Continued Medical Work up ? ?Expected Discharge Plan and Services ?  ?  ?  ?  ?  ?                ?  ?  ?  ?  ?  ?  ?  ?  ?  ?  ? ? ?Social Determinants of Health (SDOH) Interventions ?  ? ?Readmission Risk Interventions ?   ? View : No data to display.  ?  ?  ?  ? ? ?

## 2021-05-06 NOTE — Progress Notes (Signed)
Orthopedic Tech Progress Note ?Patient Details:  ?Robert Santiago ?07/02/1950 ?[id number] ? ?Ortho Devices ?Type of Ortho Device: Postop shoe/boot ?Ortho Device/Splint Location: bilateral ?Ortho Device/Splint Interventions: Application ?  ?Post Interventions ?Patient Tolerated: Well ?Instructions Provided: Care of device ? ?Robert Santiago ?05/06/2021, 10:51 AM ? ?

## 2021-05-06 NOTE — Evaluation (Signed)
Physical Therapy Evaluation ?Patient Details ?Name: Robert Santiago ?MRN: [id number] ?DOB: 07-08-1950 ?Today's Date: 05/06/2021 ? ?History of Present Illness ? Pt is 71 yo male admitted with R foot osteomyelitis and L 4th digit osteomyelitis on 05/04/21 and is s/p R transmetatarsal amputation and L 4th digits amputation on 05/05/21.  Podiatry recommended post op shoe and PWB-heel weight bearing.  Pt with hx including but not limited to CAD status post CABG, hypertension, hyperlipidemia, diabetes mellitus type 2 with polyneuropathy, GERD, BPH, hypothyroidism, multiple ulcerations of bilateral lower extremities with osteomyelitis of left fourth toe as well as history of right great toe osteomyelitis status post right great toe amputation on 10/18/2020  ?Clinical Impression ? Pt admitted with above diagnosis. At baseline, pt is independent.  Pt now s/p bil foot surgery with orders for WBIng through heels.  Pt with post op shoe on L but dressing to bulky to fit one on right.  Pt has been ambulating in room some on his own without RW. Advised/educated pt to use RW at all times to assist with PWB.  Pt ambulated 48' with RW and supervision but min cues for heel weight bearing.  Pt doing well but would benefit from reinforcement of weight bearing status and safety. Pt currently with functional limitations due to the deficits listed below (see PT Problem List). Pt will benefit from skilled PT to increase their independence and safety with mobility to allow discharge to the venue listed below.   ?   ?   ? ?Recommendations for follow up therapy are one component of a multi-disciplinary discharge planning process, led by the attending physician.  Recommendations may be updated based on patient status, additional functional criteria and insurance authorization. ? ?Follow Up Recommendations No PT follow up ? ?  ?Assistance Recommended at Discharge PRN  ?Patient can return home with the following ? Help with stairs or ramp for  entrance;Assistance with cooking/housework ? ?  ?Equipment Recommendations None recommended by PT  ?Recommendations for Other Services ?    ?  ?Functional Status Assessment Patient has had a recent decline in their functional status and demonstrates the ability to make significant improvements in function in a reasonable and predictable amount of time.  ? ?  ?Precautions / Restrictions Precautions ?Precautions: Fall ?Required Braces or Orthoses: Other Brace ?Other Brace: bil post op shoe ?Restrictions ?Weight Bearing Restrictions: Yes ?RLE Weight Bearing: Partial weight bearing ?RLE Partial Weight Bearing Percentage or Pounds: Weight bearing in heel only ?LLE Weight Bearing: Partial weight bearing ?LLE Partial Weight Bearing Percentage or Pounds: Weight bearing in heel only  ? ?  ? ?Mobility ? Bed Mobility ?Overal bed mobility: Independent ?  ?  ?  ?  ?  ?  ?  ?  ? ?Transfers ?Overall transfer level: Modified independent ?Equipment used: Rolling walker (2 wheels) ?Transfers: Sit to/from Stand ?Sit to Stand: Modified independent (Device/Increase time) ?  ?  ?  ?  ?  ?General transfer comment: Has been mobilizing in room independently.  Pt cued for precautions wtih heel weight bearing and use of RW ?  ? ?Ambulation/Gait ?Ambulation/Gait assistance: Supervision ?Gait Distance (Feet): 75 Feet ?Assistive device: Rolling walker (2 wheels) ?Gait Pattern/deviations: Step-to pattern, Decreased stride length ?Gait velocity: decreased ?  ?  ?General Gait Details: Pt has been mobilizing in room, performed hallway ambulation with supervision.  Pt cued for use of RW to take weight off of bil LE and cued for heel weight bearing with small steps to facilitate ? ?  Stairs ?  ?  ?  ?  ?  ? ?Wheelchair Mobility ?  ? ?Modified Rankin (Stroke Patients Only) ?  ? ?  ? ?Balance Overall balance assessment: Needs assistance ?Sitting-balance support: No upper extremity supported ?Sitting balance-Leahy Scale: Good ?  ?  ?Standing balance  support: No upper extremity supported, Bilateral upper extremity supported ?Standing balance-Leahy Scale: Fair ?Standing balance comment: RW to ambulate to help with PWB but could static stand without AD ?  ?  ?  ?  ?  ?  ?  ?  ?  ?  ?  ?   ? ? ? ?Pertinent Vitals/Pain Pain Assessment ?Pain Assessment: 0-10 ?Pain Score: 1  ?Pain Location: R foot ?Pain Descriptors / Indicators: Burning ?Pain Intervention(s): Limited activity within patient's tolerance, Monitored during session  ? ? ?Home Living Family/patient expects to be discharged to:: Private residence ?Living Arrangements: Spouse/significant other ?Available Help at Discharge: Family;Available 24 hours/day ?Type of Home: House ?Home Access: Level entry ?  ?Entrance Stairs-Number of Steps: level in back; 4-5 in front ?  ?Home Layout: One level ?Home Equipment: Conservation officer, nature (2 wheels);Cane - single point;Grab bars - tub/shower ?   ?  ?Prior Function Prior Level of Function : Independent/Modified Independent;Driving ?  ?  ?  ?  ?  ?  ?Mobility Comments: Could ambulate in community ?ADLs Comments: Independent ?  ? ? ?Hand Dominance  ?   ? ?  ?Extremity/Trunk Assessment  ? Upper Extremity Assessment ?Upper Extremity Assessment: Overall WFL for tasks assessed ?  ? ?Lower Extremity Assessment ?Lower Extremity Assessment: Overall WFL for tasks assessed ?  ? ?Cervical / Trunk Assessment ?Cervical / Trunk Assessment: Normal  ?Communication  ? Communication: No difficulties  ?Cognition Arousal/Alertness: Awake/alert ?Behavior During Therapy: [facility] for tasks assessed/performed ?Overall Cognitive Status: Within Functional Limits for tasks assessed ?  ?  ?  ?  ?  ?  ?  ?  ?  ?  ?  ?  ?  ?  ?  ?  ?  ?  ?  ? ?  ?General Comments General comments (skin integrity, edema, etc.): Pt has post op shoe on L , has shoe for R LE but dressing to bulky to fit.   Educated on not getting dressings wet (could place bag on R foot to get in house if ground wet since shoe not fitting).   Encouraged to keep elevated and perform AROM as able.  Also, recommended using RW at all time to facilitate healing of wounds by limiting weight on LE ? ?  ?Exercises    ? ?Assessment/Plan  ?  ?PT Assessment Patient needs continued PT services  ?PT Problem List Decreased mobility;Decreased safety awareness;Decreased balance;Decreased knowledge of use of DME ? ?   ?  ?PT Treatment Interventions DME instruction;Therapeutic activities;Gait training;Therapeutic exercise;Patient/family education;Stair training;Balance training;Functional mobility training   ? ?PT Goals (Current goals can be found in the Care Plan section)  ?Acute Rehab PT Goals ?Patient Stated Goal: return home ?PT Goal Formulation: With patient ?Time For Goal Achievement: 05/13/21 ?Potential to Achieve Goals: Good ? ?  ?Frequency Min 3X/week ?  ? ? ?Co-evaluation   ?  ?  ?  ?  ? ? ?  ?AM-PAC PT "6 Clicks" Mobility  ?Outcome Measure Help needed turning from your back to your side while in a flat bed without using bedrails?: None ?Help needed moving from lying on your back to sitting on the side of a flat bed without using  bedrails?: None ?Help needed moving to and from a bed to a chair (including a wheelchair)?: None ?Help needed standing up from a chair using your arms (e.g., wheelchair or bedside chair)?: None ?Help needed to walk in hospital room?: None ?Help needed climbing 3-5 steps with a railing? : A Little ?6 Click Score: 23 ? ?  ?End of Session Equipment Utilized During Treatment: Other (comment) (post op shoe on L; R not fitting) ?Activity Tolerance: Patient tolerated treatment well ?Patient left: in chair;with call bell/phone within reach (pt mobilizing independently in room) ?Nurse Communication: Mobility status ?PT Visit Diagnosis: Other abnormalities of gait and mobility (R26.89) ?  ? ?Time: 3244-0102 ?PT Time Calculation (min) (ACUTE ONLY): 17 min ? ? ?Charges:   PT Evaluation ?$PT Eval Low Complexity: 1 Low ?  ?  ?   ? ? ?Abran Richard, PT ?Acute  Rehab Services ?Pager [phone] ?[facility] [phone] ? ? ?Robert Santiago ?05/06/2021, 12:32 PM ? ?

## 2021-05-06 NOTE — Plan of Care (Signed)

## 2021-05-06 NOTE — Plan of Care (Signed)

## 2021-05-06 NOTE — Progress Notes (Signed)
?Subjective:  ?Patient ID: Robert Santiago, male    DOB: 02/04/1950,  MRN: [id number] ? ?Patient seen at bedside this morning s/p right transmetatarsal amputation and left fourth digit amputation. Relates he is doing well with just a little pain. Denies n/v/f/c.  ? ?Past Medical History:  ?Diagnosis Date  ? Acquired hypothyroidism 03/19/2019  ? Atherosclerosis of autologous vein coronary artery bypass graft(s) with unstable angina pectoris ([place]) 03/19/2019  ? Coronary artery disease involving native coronary artery of native heart with angina pectoris ([place]) 03/19/2019  ? Cath 08/20/14: Conclusions Diagnostic Procedure Summary 1. Severe, diffuse multi-vessel CAD 2. Normal LV function Diagnostic Procedure Recommendations CTS consult Angiographic findings Cardiac Arteries and Lesion Findings LMCA: Normal. LAD: Lesion on Prox LAD: 80% stenosis. Lesion on Dist LAD: 80% stenosis. LCx: Lesion on Prox CX: Proximal subsection.80% stenosis. Lesion on 1st Ob Marg: 80% stenos  ? Coronary artery disease of native artery of native heart with stable angina pectoris ([place])   ? COVID-19 08/2018  ? Diabetes mellitus without complication ([place])   ? Encounter for screening for diabetes mellitus 03/19/2019  ? IMOUPDATE  ? Essential hypertension   ? Hx of CABG 08/23/2014  ? (Dr. Darryl Nestle) CABG x5: LIMA-LAD, SVG-D1, SVG-RI, SVG-OM 1, SVG-RPL.  ? Hyperlipidemia   ? Multiple vessel coronary artery disease 08/21/2014  ? Cath [facility]): Right dominant -RCA: Proximal 95, mid 95, distal 95.  LM-normal.  LAD: Proximal 80%, distal 80%; LCx: Proximal 80%, OM1 80%. -Normal EF. -->  Referred for CABG  ? Progressive angina (HCC)-class III 12/19/2018  ? Status post cardiac catheterization 03/19/2019  ? Type 2 diabetes mellitus without complication ([place]) 09/18/7652  ?  ? ?Past Surgical History:  ?Procedure Laterality Date  ? ACOUSTIC NEUROMA RESECTION    ? APPENDECTOMY    ? CARDIAC CATHETERIZATION  08/21/2014  ? Right dominant -RCA: Proximal 95, mid 95,  distal 95.  LM-normal.  LAD: Proximal 80%, distal 80%; LCx: Proximal 80%, OM1 80%. -Normal EF. -->  Referred for CABG  ? CORONARY ARTERY BYPASS GRAFT  08/23/2014  ? (Dr. Darryl Nestle) CABG x5: LIMA-LAD, SVG-D1, SVG-RI, SVG-OM 1, SVG-RPL.  ? LEFT HEART CATH AND CORS/GRAFTS ANGIOGRAPHY N/A 02/09/2019  ? Procedure: LEFT HEART CATH AND CORS/GRAFTS ANGIOGRAPHY;  Surgeon: Leonie Man, MD;  Location: [place] [facility];  Service: Cardiovascular;  Laterality: N/A;  ? TRANSTHORACIC ECHOCARDIOGRAM  08/2014  ? ([place]) mild LVH, EF 55% with no R WMA.  Normal valves.  ? ? ? ?  Latest Ref Rng & Units 05/05/2021  ?  3:25 AM 05/04/2021  ?  3:33 AM 04/22/2020  ? 11:22 AM  ?CBC  ?WBC 4.0 - 10.5 K/uL 9.6   9.3   9.2    ?Hemoglobin 13.0 - 17.0 g/dL 12.2   12.9   14.3    ?Hematocrit 39.0 - 52.0 % 36.1   39.5   43.2    ?Platelets 150 - 400 K/uL 302   289   286    ? ? ? ?  Latest Ref Rng & Units 05/05/2021  ?  3:25 AM 05/04/2021  ?  3:33 AM 04/22/2020  ? 11:22 AM  ?BMP  ?Glucose 70 - 99 mg/dL 214   242   149    ?BUN 8 - 23 mg/dL '15   15   15    '$ ?Creatinine 0.61 - 1.24 mg/dL 0.85   0.92   0.91    ?BUN/Creat Ratio 10 - 24   16    ?  Sodium 135 - 145 mmol/L 136   135   141    ?Potassium 3.5 - 5.1 mmol/L 3.9   4.0   4.6    ?Chloride 98 - 111 mmol/L 104   101   101    ?CO2 22 - 32 mmol/L '25   27   22    '$ ?Calcium 8.9 - 10.3 mg/dL 8.9   9.0   9.6    ? ? ? ?Objective:  ? ?Vitals:  ? 05/05/21 2025 05/06/21 0513  ?BP: (!) 141/70 140/78  ?Pulse: 73 78  ?Resp: 19 18  ?Temp: 98.4 ?F (36.9 ?C) 98.5 ?F (36.9 ?C)  ?SpO2: 100% 97%  ? ? ?General:AA&O x 3. Normal mood and affect  ? ?Vascular: DP and PT pulses 2/4 bilateral. Brisk capillary refill to all digits. Pedal hair present  ? ?Neruological. Epicritic sensation grossly intact.  ? ?Derm: Bilateral dressings are clean dry and intact. Small amount of strike through noted to right dressing.  ? ?MSK: MMT 5/5 in dorsiflexion, plantar flexion, inversion and eversion. Normal joint ROM without pain or  crepitus.  ? ? ? ? ?Assessment & Plan:  ?Patient was evaluated and treated and all questions answered. ? ?DX: s/p right transmetatarsal amputation and left fourth digit amputation  ?Will keep dressing clean dry and intact until follow-up outpaitnet. Will likely need two weeks of oral antibiotics upon discharge. Cultures currently pending, gram stain with no organisms.  ?DME: bilateral post-op shoes   ?Patient is ok for discharge from podiatry standpoint. Will follow-up in one week in clinic upon discharge.  ? ?Lorenda Peck, MD ? ?Accessible via secure chat for questions or concerns. ? ?

## 2021-05-06 NOTE — Progress Notes (Signed)
?PROGRESS NOTE ? ? ? ?Robert Santiago  YPP:[id number] DOB: [birth year] DOA: 05/04/2021 ?PCP: Cyndi Bender, PA-C  ? ?Brief Narrative:  ? ?71 year old male with history of CAD status post CABG, hypertension, hyperlipidemia, diabetes mellitus type 2 with polyneuropathy, GERD, BPH, hypothyroidism, multiple ulcerations of bilateral lower extremities with osteomyelitis of left fourth toe as well as history of right great toe osteomyelitis status post right great toe amputation on 10/18/2020 presented with worsening redness of the right leg and x-ray imaging suspicious for developing osteomyelitis with possible abscess formation.  Patient was transferred from [facility] for podiatry evaluation.  He was started on broad-spectrum antibiotics.  He underwent surgical intervention by podiatry on 05/05/2021. ? ?Assessment & Plan: ?  ?Right foot osteomyelitis of second and third metatarsals ?Left fourth digit osteomyelitis ?Right leg cellulitis ?-MRI of bilateral lower extremity showing osteomyelitis as above.   ?-Currently on broad-spectrum antibiotics. ?-Status post right TMA and left fourth digit amputation on 05/05/2021.  Podiatry following.  Wound care as per podiatry recommendations.  Follow or cultures. ?-PT eval today.  Possible discharge home tomorrow on oral antibiotics for 2 weeks and outpatient follow-up with podiatry. ? ?CAD with history of CABG ?Hyperlipidemia ?-Currently chest pain-free.  Continue aspirin and Plavix.  Continue statin, ezetimibe, metoprolol ? ?Diabetes mellitus type 2 with hyperglycemia and diabetic polyneuropathy ?-Continue long-acting insulin along with CBGs with SSI and gabapentin ?-A1c 9.4 ? ?Anemia of chronic disease ?-Hemoglobin stable ? ?Hypomagnesemia ?-Resolved ? ?Hypothyroidism ?-Continue levothyroxine ? ?GERD ?-Continue Protonix ? ?BPH ?-Continue Flomax ? ?Anxiety/depression ?-Continue duloxetine and buspirone ? ?DVT prophylaxis: Hold Lovenox until cleared by podiatry ?code Status:  Full ?Family Communication: None at bedside ?Disposition Plan: ?Status is: Inpatient ?Remains inpatient appropriate because: Of severity of illness.  OR cultures pending.  PT eval pending. ? ? ? ?Consultants: Podiatry ? ?Procedures: None ? ?Antimicrobials: Cefepime, Flagyl and vancomycin from 05/03/2021 onwards ? ? ?Subjective: ?Patient seen and examined at bedside.  Feels intermittent lower extremity tightness and pain bilaterally.  No overnight fever, vomiting, worsening shortness of breath reported. ? ?Objective: ?Vitals:  ? 05/05/21 1730 05/05/21 1745 05/05/21 2025 05/06/21 0513  ?BP: 115/65 121/88 (!) 141/70 140/78  ?Pulse: 76 74 73 78  ?Resp: '18 18 19 18  '$ ?Temp:   98.4 ?F (36.9 ?C) 98.5 ?F (36.9 ?C)  ?TempSrc:   Oral   ?SpO2: 97% 98% 100% 97%  ?Weight:      ?Height:      ? ? ?Intake/Output Summary (Last 24 hours) at 05/06/2021 4580 ?Last data filed at 05/06/2021 0200 ?Gross per 24 hour  ?Intake 1490 ml  ?Output 50 ml  ?Net 1440 ml  ? ? ?Filed Weights  ? 05/04/21 1002  ?Weight: 127 kg  ? ? ?Examination: ? ?General: On room air.  No distress.  ?respiratory: Decreased breath sounds at bases bilaterally with some crackles ?CVS: Currently rate controlled; S1-S2 heard  ?abdominal: Soft, obese, nontender, slightly distended, no organomegaly; normal bowel sounds are heard  ?extremities: Trace lower extremity edema; no clubbing.  Bilateral lower extremity dressing present. ? ? ? ? ?Data Reviewed: I have personally reviewed following labs and imaging studies ? ?CBC: ?Recent Labs  ?Lab 05/04/21 ?9983 05/05/21 ?0325  ?WBC 9.3 9.6  ?NEUTROABS 6.5 6.8  ?HGB 12.9* 12.2*  ?HCT 39.5 36.1*  ?MCV 90.4 88.7  ?PLT 289 302  ? ? ?Basic Metabolic Panel: ?Recent Labs  ?Lab 05/04/21 ?3825 05/05/21 ?0325  ?NA 135 136  ?K 4.0 3.9  ?CL 101 104  ?CO2  27 25  ?GLUCOSE 242* 214*  ?BUN 15 15  ?CREATININE 0.92 0.85  ?CALCIUM 9.0 8.9  ?MG 1.6* 1.8  ? ? ?GFR: ?Estimated Creatinine Clearance: 111.4 mL/min (by C-G formula based on SCr of 0.85  mg/dL). ?Liver Function Tests: ?Recent Labs  ?Lab 05/04/21 ?0333  ?AST 15  ?ALT 18  ?ALKPHOS 66  ?BILITOT 0.4  ?PROT 7.3  ?ALBUMIN 3.2*  ? ? ?No results for input(s): LIPASE, AMYLASE in the last 168 hours. ?No results for input(s): AMMONIA in the last 168 hours. ?Coagulation Profile: ?No results for input(s): INR, PROTIME in the last 168 hours. ?Cardiac Enzymes: ?No results for input(s): CKTOTAL, CKMB, CKMBINDEX, TROPONINI in the last 168 hours. ?BNP (last 3 results) ?No results for input(s): PROBNP in the last 8760 hours. ?HbA1C: ?Recent Labs  ?  05/04/21 ?0333  ?HGBA1C 9.4*  ? ? ?CBG: ?Recent Labs  ?Lab 05/05/21 ?0739 05/05/21 ?1140 05/05/21 ?1718 05/05/21 ?2026 05/06/21 ?0801  ?GLUCAP 212* 189* 160* 164* 151*  ? ? ?Lipid Profile: ?No results for input(s): CHOL, HDL, LDLCALC, TRIG, CHOLHDL, LDLDIRECT in the last 72 hours. ?Thyroid Function Tests: ?No results for input(s): TSH, T4TOTAL, FREET4, T3FREE, THYROIDAB in the last 72 hours. ?Anemia Panel: ?No results for input(s): VITAMINB12, FOLATE, FERRITIN, TIBC, IRON, RETICCTPCT in the last 72 hours. ?Sepsis Labs: ?Recent Labs  ?Lab 05/04/21 ?4332 05/04/21 ?0737  ?PROCALCITON <0.10  --   ?LATICACIDVEN 1.2 0.8  ? ? ? ?Recent Results (from the past 240 hour(s))  ?WOUND CULTURE     Status: Abnormal  ? Collection Time: 04/26/21  1:18 PM  ? Specimen: Foot, Right; Wound  ? Wound Culture and sens  ?Result Value Ref Range Status  ? Gram Stain Result Final report  Final  ? Organism ID, Bacteria Comment  Final  ?  Comment: No white blood cells seen.  ? Organism ID, Bacteria Comment  Final  ?  Comment: Moderate gram negative rods.  ? Aerobic Bacterial Culture Final report (A)  Final  ? Organism ID, Bacteria Comment (A)  Final  ?  Comment: Pseudomonas aeruginosa ?Multi-Drug Resistant Organism ?Heavy growth ?  ? Organism ID, Bacteria Morganella morganii (A)  Final  ?  Comment: Heavy growth  ? Organism ID, Bacteria Mixed skin flora  Final  ?  Comment: Scant growth  ? Antimicrobial  Susceptibility Comment  Final  ?  Comment:       ** S = Susceptible; I = Intermediate; R = Resistant ** ?                   P = Positive; N = Negative ?            MICS are expressed in micrograms per mL ?   Antibiotic                 RSLT#1    RSLT#2    RSLT#3    RSLT#4 ?Amikacin                       S ?Cefepime                       S         S ?Ceftazidime                    S ?Ceftriaxone  S ?Ciprofloxacin                  R         S ?Gentamicin                     S         S ?Imipenem                       S ?Levofloxacin                   R         S ?Meropenem                      I         S ?Piperacillin                   S ?Tetracycline                             R ?Ticarcillin                    R ?Tobramycin                     S         S ?Trimethoprim/Sulfa                       S ?  ?Culture, blood (Routine X 2) w Reflex to ID Panel     Status: None (Preliminary result)  ? Collection Time: 05/04/21  3:33 AM  ? Specimen: BLOOD  ?Result Value Ref Range Status  ? Specimen Description   Final  ?  BLOOD OUTSIDE SERVICE OPLAB ?Performed at [facility], [place] [address] [place], [place] [address] ?  ? Special Requests   Final  ?  BOTTLES DRAWN AEROBIC ONLY Blood Culture adequate volume ?Performed at [facility], [place] [address], [place], [place] [address] ?  ? Culture   Final  ?  NO GROWTH 2 DAYS ?Performed at [facility], [place] [address], [place], [place] [address] ?  ? Report Status PENDING  Incomplete  ?Culture, blood (Routine X 2) w Reflex to ID Panel     Status: None (Preliminary result)  ? Collection Time: 05/04/21  3:33 AM  ? Specimen: BLOOD  ?Result Value Ref Range Status  ? Specimen Description   Final  ?  BLOOD RIGHT ANTECUBITAL ?Performed at [facility], [place] [address] [place], [place] [address] ?  ? Special Requests   Final  ?  BOTTLES DRAWN AEROBIC ONLY Blood Culture adequate volume ?Performed at  [facility], [place] [address], [place], [place] [address] ?  ? Culture   Final  ?  NO GROWTH 2 DAYS ?Performed at [facility], [place] [address], [place],  [address] ?  ? Report Status

## 2021-05-07 ENCOUNTER — Encounter (HOSPITAL_COMMUNITY): Payer: Self-pay | Admitting: Podiatry

## 2021-05-07 DIAGNOSIS — E669 Obesity, unspecified: Secondary | ICD-10-CM

## 2021-05-07 DIAGNOSIS — L03115 Cellulitis of right lower limb: Secondary | ICD-10-CM | POA: Diagnosis not present

## 2021-05-07 DIAGNOSIS — K219 Gastro-esophageal reflux disease without esophagitis: Secondary | ICD-10-CM | POA: Diagnosis not present

## 2021-05-07 DIAGNOSIS — I10 Essential (primary) hypertension: Secondary | ICD-10-CM | POA: Diagnosis not present

## 2021-05-07 DIAGNOSIS — I2510 Atherosclerotic heart disease of native coronary artery without angina pectoris: Secondary | ICD-10-CM | POA: Diagnosis not present

## 2021-05-07 HISTORY — DX: Obesity, unspecified: E66.9

## 2021-05-07 LAB — GLUCOSE, CAPILLARY: Glucose-Capillary: 135 mg/dL — ABNORMAL HIGH (ref 70–99)

## 2021-05-07 MED ORDER — POLYETHYLENE GLYCOL 3350 17 G PO PACK: 17.0000 g | PACK | Freq: Every day | ORAL | 0 refills | Status: DC | PRN

## 2021-05-07 MED ORDER — SODIUM CHLORIDE 0.9 % IV SOLN: INTRAVENOUS | Status: DC | PRN

## 2021-05-07 MED ORDER — AMOXICILLIN-POT CLAVULANATE 875-125 MG PO TABS: 1.0000 | ORAL_TABLET | Freq: Two times a day (BID) | ORAL | 0 refills | Status: DC

## 2021-05-07 MED ORDER — DOXYCYCLINE HYCLATE 100 MG PO CAPS
100.0000 mg | ORAL_CAPSULE | Freq: Two times a day (BID) | ORAL | 0 refills | Status: AC
Start: 2021-05-07 — End: 2021-05-21

## 2021-05-07 MED ORDER — DOXYCYCLINE HYCLATE 100 MG PO CAPS: 100.0000 mg | ORAL_CAPSULE | Freq: Two times a day (BID) | ORAL | 0 refills | Status: DC

## 2021-05-07 NOTE — Discharge Summary (Signed)
Physician Discharge Summary  ?Robert Santiago KHV:[id number] DOB: 1950/08/12 DOA: 05/04/2021 ? ?PCP: Cyndi Bender, PA-C ? ?Admit date: 05/04/2021 ?Discharge date: 05/07/2021 ? ?Admitted From: Home ?Disposition: Home ? ?Recommendations for Outpatient Follow-up:  ?Follow up with PCP in 1 week with repeat CBC/BMP ?Outpatient follow-up with podiatry.  Wound care as per podiatry recommendations. ?Follow up in ED if symptoms worsen or new appear ? ? ?Home Health: No ?Equipment/Devices: None ? ?Discharge Condition: Stable ?CODE STATUS: Full ?Diet recommendation: Heart healthy ? ?Brief/Interim Summary: ?71 year old male with history of CAD status post CABG, hypertension, hyperlipidemia, diabetes mellitus type 2 with polyneuropathy, GERD, BPH, hypothyroidism, multiple ulcerations of bilateral lower extremities with osteomyelitis of left fourth toe as well as history of right great toe osteomyelitis status post right great toe amputation on 10/18/2020 presented with worsening redness of the right leg and x-ray imaging suspicious for developing osteomyelitis with possible abscess formation.  Patient was transferred from [facility] for [facility]ry evaluation.  He was started on broad-spectrum antibiotics.  He underwent surgical intervention by podiatry on 05/05/2021.  OR cultures have remained negative so far.  Patient has tolerated physical therapy.  Podiatry has cleared the patient for discharge.  He will be discharged home today on oral antibiotics with close outpatient follow-up with podiatry. ?  ? ?Discharge Diagnoses:  ?Right foot osteomyelitis of second and third metatarsals ?Left fourth digit osteomyelitis ?Right leg cellulitis ?-MRI of bilateral lower extremity showing osteomyelitis as above.   ?-Currently on broad-spectrum antibiotics. ?-Status post right TMA and left fourth digit amputation on 05/05/2021.  Podiatry following and has cleared the patient for discharge.  Wound care as per podiatry recommendations.  OR  cultures negative so far. ?-Patient has tolerated physical therapy. He will be discharged home today on oral antibiotics, Augmentin and doxycycline for 2 weeks, with close outpatient follow-up with podiatry. ?  ?CAD with history of CABG ?Hyperlipidemia ?-Currently chest pain-free.  Continue aspirin and Plavix.  Continue statin, ezetimibe, metoprolol ?  ?Diabetes mellitus type 2 with hyperglycemia and diabetic polyneuropathy ?-Continue home regimen and gabapentin. ?  ?Anemia of chronic disease ?-Hemoglobin stable.  Outpatient follow-up ? ?Hypomagnesemia ?-Resolved ? ?Hypothyroidism ?-Continue levothyroxine ? ?GERD ?-Continue Protonix ? ?BPH ?-Continue Flomax ?  ?Anxiety/depression ?-Continue duloxetine and buspirone ? ?Obesity ?-Outpatient follow-up ? ? ?Discharge Instructions ? ? ?Allergies as of 05/07/2021   ?No Known Allergies ?  ? ?  ?Medication List  ?  ? ?STOP taking these medications   ? ?gabapentin 300 MG capsule ?Commonly known as: NEURONTIN ?  ?gentamicin ointment 0.1 % ?Commonly known as: GARAMYCIN ?  ? ?  ? ?TAKE these medications   ? ?Accu-Chek Guide w/Device Kit ?  ?acetaminophen 500 MG tablet ?Commonly known as: TYLENOL ?Take 1,000-1,500 mg by mouth 2 (two) times daily as needed for headache (pain). ?  ?ALPRAZolam 0.25 MG tablet ?Commonly known as: Duanne Moron ?Take 0.25 mg by mouth daily as needed for anxiety (panic attacks). ?  ?amoxicillin-clavulanate 875-125 MG tablet ?Commonly known as: Augmentin ?Take 1 tablet by mouth 2 (two) times daily. ?  ?aspirin EC 81 MG tablet ?Take 81 mg by mouth at bedtime. ?  ?BD Pen Needle Nano U/F 32G X 4 MM Misc ?Generic drug: Insulin Pen Needle ?SMARTSIG:1 Each SUB-Q Daily ?  ?benazepril 20 MG tablet ?Commonly known as: LOTENSIN ?Take 20 mg by mouth every morning. ?  ?busPIRone 5 MG tablet ?Commonly known as: BUSPAR ?Take 5 mg by mouth 2 (two) times daily. ?  ?clopidogrel 75 MG tablet ?  Commonly known as: PLAVIX ?Take 1 tablet (75 mg total) by mouth daily. ?What changed:  when to take this ?  ?doxycycline 100 MG capsule ?Commonly known as: VIBRAMYCIN ?Take 1 capsule (100 mg total) by mouth 2 (two) times daily for 14 days. ?  ?DULoxetine 60 MG capsule ?Commonly known as: CYMBALTA ?120 mg at bedtime. ?  ?ezetimibe 10 MG tablet ?Commonly known as: ZETIA ?Take 10 mg by mouth at bedtime. ?  ?ibuprofen 200 MG tablet ?Commonly known as: ADVIL ?Take 200 mg by mouth 2 (two) times daily. ?  ?Lantus SoloStar 100 UNIT/ML Solostar Pen ?Generic drug: insulin glargine ?Inject 48-50 Units into the skin at bedtime. ?  ?levothyroxine 175 MCG tablet ?Commonly known as: SYNTHROID ?Take 175 mcg by mouth every morning. ?  ?metFORMIN 1000 MG tablet ?Commonly known as: GLUCOPHAGE ?Take 1,000 mg by mouth 2 (two) times daily. ?  ?metoprolol tartrate 25 MG tablet ?Commonly known as: LOPRESSOR ?Take 25 mg by mouth 2 (two) times daily. ?  ?nitroGLYCERIN 0.4 MG SL tablet ?Commonly known as: NITROSTAT ?0.4 mg every 5 (five) minutes as needed for chest pain. ?  ?omeprazole 40 MG capsule ?Commonly known as: PRILOSEC ?Take 40 mg by mouth every morning. ?  ?oxyCODONE-acetaminophen 5-325 MG tablet ?Commonly known as: PERCOCET/ROXICET ?Take 1 tablet by mouth every 4 (four) hours as needed for up to 7 days for severe pain. ?  ?polyethylene glycol 17 g packet ?Commonly known as: MIRALAX / GLYCOLAX ?Take 17 g by mouth daily as needed for mild constipation. ?  ?rosuvastatin 10 MG tablet ?Commonly known as: CRESTOR ?Take 10 mg by mouth at bedtime. ?  ?tamsulosin 0.4 MG Caps capsule ?Commonly known as: FLOMAX ?Take 0.4 mg by mouth at bedtime. ?  ? ?  ? ?  ?  ? ? ?  ?Durable Medical Equipment  ?(From admission, onward)  ?  ? ? ?  ? ?  Start     Ordered  ? 05/05/21 1711  For home use only DME Other see comment  Once       ?Comments: Bilateral post-op shoes  ?Question:  Length of Need  Answer:  6 Months  ? 05/05/21 1710  ? ?  ?  ? ?  ? ? ?  ?Discharge Care Instructions  ?(From admission, onward)  ?  ? ? ?  ? ?  Start     Ordered   ? 05/07/21 0000  Discharge wound care:       ?Comments: As per podiatry recommendations  ? 05/07/21 0926  ? ?  ?  ? ?  ? ? ? ? ?No Known Allergies ? ?Consultations: ?Podiatry ? ? ?Procedures/Studies: ?MR FOOT RIGHT WO CONTRAST ? ?Result Date: 05/04/2021 ?CLINICAL DATA:  Right foot redness and swelling. Evaluate for osteomyelitis. EXAM: MRI OF THE RIGHT FOREFOOT WITHOUT CONTRAST TECHNIQUE: Multiplanar, multisequence MR imaging of the right forefoot was performed. No intravenous contrast was administered. COMPARISON:  CT right foot dated May 02, 2021. MRI right foot dated September 16, 2020. FINDINGS: Bones/Joint/Cartilage Prominent abnormal marrow edema with corresponding decreased T1 marrow signal involving the majority of the second metatarsal, sparing the base, and the entire second proximal phalanx. Similar but lesser marrow signal changes involving the third metatarsal head and third proximal phalanx. Prior first ray amputation. Significant dorsal subluxation at the second MTP joint. No fracture. Mild midfoot osteoarthritis. Large second MTP joint effusion. Ligaments The second MTP joint collateral ligaments are eroded. Remaining collateral ligaments are intact. Muscles and Tendons Postsurgical  changes of the first flexor and extensor tendons. Fluid within the second flexor tendon sheath. Increased T2 signal within the intrinsic muscles of the forefoot, nonspecific, but likely related to diabetic muscle changes. Soft tissue Severe forefoot soft tissue swelling extending into the second and third toes. Plantar ulceration at the second metatarsal head with sinus tract extending to the second MTP joint. No fluid collection. No soft tissue mass. IMPRESSION: 1. Plantar ulceration at the second metatarsal head with sinus tract extending to the second MTP joint. Second MTP joint septic arthritis and osteomyelitis of the second proximal phalanx and second metatarsal. 2. Similar but lesser third MTP joint septic  arthritis with osteomyelitis of the third metatarsal head and third proximal phalanx. 3. Severe forefoot soft tissue swelling extending into the second and third toes. No abscess. Electronically Signed   By: Billey Gosling

## 2021-05-07 NOTE — Plan of Care (Signed)
  Problem: Education: Goal: Knowledge of General Education information will improve Description Including pain rating scale, medication(s)/side effects and non-pharmacologic comfort measures Outcome: Progressing   Problem: Health Behavior/Discharge Planning: Goal: Ability to manage health-related needs will improve Outcome: Progressing   

## 2021-05-07 NOTE — Progress Notes (Signed)
The patient is alert and oriented and has been seen by his physician. The orders for discharge were written. IV has been removed. Went over discharge instructions with patient and family. He is being discharged via wheelchair with all of his belongings.  

## 2021-05-07 NOTE — TOC Transition Note (Signed)
Transition of Care (TOC) - CM/SW Discharge Note ? ? ?Patient Details  ?Name: Robert Santiago ?MRN: [id number] ?Date of Birth: 10-Aug-1950 ? ?Transition of Care (TOC) CM/SW Contact:  ?Ross Ludwig, LCSW ?Phone Number: ?05/07/2021, 10:56 AM ? ? ?Clinical Narrative:    ? ?PT worked with patient and said no PT follow up, [place] signing off, patient will be discharging back home today. ? ?Final next level of care: Home/Self Care ?Barriers to Discharge: Barriers Resolved ? ? ?Patient Goals and CMS Choice ?Patient states their goals for this hospitalization and ongoing recovery are:: To return back home. ?CMS Medicare.gov Compare Post Acute Care list provided to:: Patient ?Choice offered to / list presented to : Patient ? ?Discharge Placement ?  ?           ?  ?  ?  ?  ? ?Discharge Plan and Services ?  ?  ?           ?  ?  ?  ?  ?  ?  ?  ?  ?  ?  ? ?Social Determinants of Health (SDOH) Interventions ?  ? ? ?Readmission Risk Interventions ?   ? View : No data to display.  ?  ?  ?  ? ? ? ? ? ?

## 2021-05-08 LAB — GLUCOSE, CAPILLARY: Glucose-Capillary: 159 mg/dL — ABNORMAL HIGH (ref 70–99)

## 2021-05-08 NOTE — Anesthesia Postprocedure Evaluation (Signed)
Anesthesia Post Note ? ?Patient: Robert Santiago ? ?Procedure(s) Performed: AMPUTATION FOOT (Right: Foot) ?AMPUTATION TOE (Left: Toe) ? ?  ? ?Patient location during evaluation: PACU ?Anesthesia Type: MAC ?Level of consciousness: awake and alert ?Pain management: pain level controlled ?Vital Signs Assessment: post-procedure vital signs reviewed and stable ?Respiratory status: spontaneous breathing, nonlabored ventilation, respiratory function stable and patient connected to nasal cannula oxygen ?Cardiovascular status: stable and blood pressure returned to baseline ?Postop Assessment: no apparent nausea or vomiting ?Anesthetic complications: no ? ? ?No notable events documented. ? ?Last Vitals:  ?Vitals:  ? 05/06/21 2216 05/07/21 0538  ?BP: 117/69 129/77  ?Pulse: 75 73  ?Resp: 18 18  ?Temp: 37.1 ?C 37.2 ?C  ?SpO2: 98% 99%  ?  ?Last Pain:  ?Vitals:  ? 05/07/21 0920  ?TempSrc:   ?PainSc: 0-No pain  ? ? ?  ?  ?  ?  ?  ?  ? ?Suzette Battiest E ? ? ? ? ?

## 2021-05-09 LAB — SURGICAL PATHOLOGY

## 2021-05-09 LAB — CULTURE, BLOOD (ROUTINE X 2)
Culture: NO GROWTH
Culture: NO GROWTH
Special Requests: ADEQUATE
Special Requests: ADEQUATE

## 2021-05-10 ENCOUNTER — Ambulatory Visit (INDEPENDENT_AMBULATORY_CARE_PROVIDER_SITE_OTHER): Payer: Medicare HMO

## 2021-05-10 ENCOUNTER — Ambulatory Visit: Payer: Medicare HMO | Admitting: Sports Medicine

## 2021-05-10 ENCOUNTER — Encounter: Payer: Self-pay | Admitting: Sports Medicine

## 2021-05-10 VITALS — Temp 96.9°F

## 2021-05-10 DIAGNOSIS — L97524 Non-pressure chronic ulcer of other part of left foot with necrosis of bone: Secondary | ICD-10-CM

## 2021-05-10 DIAGNOSIS — L97511 Non-pressure chronic ulcer of other part of right foot limited to breakdown of skin: Secondary | ICD-10-CM | POA: Diagnosis not present

## 2021-05-10 DIAGNOSIS — Z9889 Other specified postprocedural states: Secondary | ICD-10-CM

## 2021-05-10 DIAGNOSIS — Z89431 Acquired absence of right foot: Secondary | ICD-10-CM

## 2021-05-10 DIAGNOSIS — E1142 Type 2 diabetes mellitus with diabetic polyneuropathy: Secondary | ICD-10-CM

## 2021-05-10 LAB — AEROBIC/ANAEROBIC CULTURE W GRAM STAIN (SURGICAL/DEEP WOUND)
Culture: NO GROWTH
Culture: NO GROWTH
Gram Stain: NONE SEEN

## 2021-05-10 NOTE — Progress Notes (Signed)
Subjective: ?Robert Santiago is a 71 y.o. male patient seen today in office for POV #1 (DOS 05/05/2021) status post left fourth toe amputation and transmetatarsal amputation on the right foot performed by Dr. Blenda Mounts at [facility]. Patient denies pain at surgical site, denies calf pain, denies headache, chest pain, shortness of breath, nausea, vomiting, fever, or chills. Patient is assisted by wife reports that she did have to reinforce the dressing on the right because some bloody drainage otherwise has been able to keep the dressing clean dry and intact.  Currently taking amoxicillin and doxycycline antibiotics twice daily without any problems. ? ?Fasting blood sugar not recorded. ? ?Patient Active Problem List  ? Diagnosis Date Noted  ? Obesity (BMI 30-39.9) 05/07/2021  ? Osteomyelitis of left leg ([place]) 05/05/2021  ? BPH (benign prostatic hyperplasia) 05/04/2021  ? Mixed diabetic hyperlipidemia associated with type 2 diabetes mellitus ([place]) 05/04/2021  ? Osteomyelitis of right foot ([place]) 05/04/2021  ? Cellulitis of right leg 05/04/2021  ? Hypomagnesemia 05/04/2021  ? Acute coronary syndrome ([place]) 12/30/2020  ? Anxiety 12/30/2020  ? Cellulitis of foot 12/30/2020  ? Chest pain 12/30/2020  ? Closed fracture of cuneiform bone of foot 12/30/2020  ? Diabetic ulcer of toe ([place]) 12/30/2020  ? Dyslipidemia 12/30/2020  ? GERD without esophagitis 12/30/2020  ? 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 Osteomyelitis of toe of right foot ([place]) 12/30/2020  ? Palpitations with regular cardiac rhythm 12/30/2020  ? Poorly controlled diabetes mellitus ([place]) 12/30/2020  ? Diabetes mellitus without complication ([place])   ? Acquired hypothyroidism 03/19/2019  ? Atherosclerosis of autologous vein coronary artery bypass graft(s) with unstable angina pectoris ([place]) 03/19/2019  ? Encounter for screening for diabetes mellitus 03/19/2019  ? Status post cardiac catheterization 03/19/2019  ? Type 2 diabetes mellitus with diabetic  polyneuropathy, with long-term current use of insulin ([place]) 03/19/2019  ? Coronary artery disease involving native coronary artery of native heart without angina pectoris 03/19/2019  ? Coronary artery disease of native artery of native heart with stable angina pectoris ([place])   ? Hyperlipidemia   ? Essential hypertension   ? Progressive angina (HCC)-class III 12/19/2018  ? COVID-19 08/2018  ? Hx of CABG 08/23/2014  ? Multiple vessel coronary artery disease 08/21/2014  ? ? ?Current Outpatient Medications on File Prior to Visit  ?Medication Sig Dispense Refill  ? acetaminophen (TYLENOL) 500 MG tablet Take 1,000-1,500 mg by mouth 2 (two) times daily as needed for headache (pain).    ? ALPRAZolam (XANAX) 0.25 MG tablet Take 0.25 mg by mouth daily as needed for anxiety (panic attacks).    ? amoxicillin-clavulanate (AUGMENTIN) 875-125 MG tablet Take 1 tablet by mouth 2 (two) times daily. 28 tablet 0  ? aspirin EC 81 MG tablet Take 81 mg by mouth at bedtime.    ? BD PEN NEEDLE NANO U/F 32G X 4 MM MISC SMARTSIG:1 Each SUB-Q Daily    ? benazepril (LOTENSIN) 20 MG tablet Take 20 mg by mouth every morning.    ? Blood Glucose Monitoring Suppl (ACCU-CHEK GUIDE) w/Device KIT     ? busPIRone (BUSPAR) 5 MG tablet Take 5 mg by mouth 2 (two) times daily.    ? clopidogrel (PLAVIX) 75 MG tablet Take 1 tablet (75 mg total) by mouth daily. (Patient taking differently: Take 75 mg by mouth every morning.) 90 tablet 3  ? doxycycline (VIBRAMYCIN) 100 MG capsule Take 1 capsule (100 mg total) by mouth 2 (two) times daily for 14 days. 28 capsule 0  ?  DULoxetine (CYMBALTA) 60 MG capsule 120 mg at bedtime.    ? ezetimibe (ZETIA) 10 MG tablet Take 10 mg by mouth at bedtime.    ? ibuprofen (ADVIL) 200 MG tablet Take 200 mg by mouth 2 (two) times daily.    ? LANTUS SOLOSTAR 100 UNIT/ML Solostar Pen Inject 48-50 Units into the skin at bedtime.    ? levothyroxine (SYNTHROID) 175 MCG tablet Take 175 mcg by mouth every morning.    ? metFORMIN  (GLUCOPHAGE) 1000 MG tablet Take 1,000 mg by mouth 2 (two) times daily.    ? metoprolol tartrate (LOPRESSOR) 25 MG tablet Take 25 mg by mouth 2 (two) times daily.     ? nitroGLYCERIN (NITROSTAT) 0.4 MG SL tablet 0.4 mg every 5 (five) minutes as needed for chest pain.    ? omeprazole (PRILOSEC) 40 MG capsule Take 40 mg by mouth every morning.    ? oxyCODONE-acetaminophen (PERCOCET/ROXICET) 5-325 MG tablet Take 1 tablet by mouth every 4 (four) hours as needed for up to 7 days for severe pain. 42 tablet 0  ? polyethylene glycol (MIRALAX / GLYCOLAX) 17 g packet Take 17 g by mouth daily as needed for mild constipation. 14 each 0  ? rosuvastatin (CRESTOR) 10 MG tablet Take 10 mg by mouth at bedtime.    ? tamsulosin (FLOMAX) 0.4 MG CAPS capsule Take 0.4 mg by mouth at bedtime.    ? ?No current facility-administered medications on file prior to visit.  ? ? ?No Known Allergies ? ?Objective: ?There were no vitals filed for this visit. ? ?General: No acute distress, AAOx3  ?Right/Left foot: Sutures intact with a small amount of central gapping noted at the right TMA stump site but no complete dehiscence there is mild swelling noted to the right TMA stump minimal swelling noted to the left fourth toe amputation stump site, minimal blanchable erythema, no warmth, no drainage, no signs of infection noted, capillary fill time intact all remaining digits of the left foot, no pain or crepitation with range of motion right/left foot.  No pain with calf compression.  ? ?Post Op Xray, Right/Left foot: Consistent with amputation status bilateral TMA on right and fourth toe amputation on left. Soft tissue swelling within normal limits for post op status.  ? ?Assessment and Plan:  ?Problem List Items Addressed This Visit   ?None ?Visit Diagnoses   ? ? S/P transmetatarsal amputation of foot, right ([place])    -  Primary  ? Relevant Orders  ? DG Foot Complete Right  ? Ulcer of left foot with necrosis of bone ([place])      ? Relevant Orders  ? DG  Foot Complete Left  ? S/P foot surgery, right      ? S/P foot surgery, left      ? Diabetic polyneuropathy associated with type 2 diabetes mellitus ([place])      ? ?  ?  ? ?-Patient seen and evaluated ?-X-rays reviewed ?-Applied dry sterile dressing to surgical site right/left foot secured with ACE wrap and stockinet and advised patient to keep dressings clean dry and intact bilateral. ?-Dispensed a proper fitting surgical shoe for the right foot and advised patient to continue also with surgical shoe on the left and limit weightbearing to necessity only and advised patient to stay off is much as possible especially the right to prevent dehiscence of amputation stump ?-Advised patient to continue with rest and elevation ?-Advised patient to continue with oral antibiotic of Augmentin and doxycycline until completed ?-  Will plan for dressing change at next office visit. In the meantime, patient to call office if any issues or problems arise.  ? ?Landis Martins, DPM  ?

## 2021-05-12 LAB — AEROBIC/ANAEROBIC CULTURE W GRAM STAIN (SURGICAL/DEEP WOUND)

## 2021-05-16 ENCOUNTER — Ambulatory Visit (INDEPENDENT_AMBULATORY_CARE_PROVIDER_SITE_OTHER): Payer: Medicare HMO | Admitting: Sports Medicine

## 2021-05-16 ENCOUNTER — Encounter: Payer: Self-pay | Admitting: Sports Medicine

## 2021-05-16 DIAGNOSIS — L97524 Non-pressure chronic ulcer of other part of left foot with necrosis of bone: Secondary | ICD-10-CM

## 2021-05-16 DIAGNOSIS — Z89431 Acquired absence of right foot: Secondary | ICD-10-CM

## 2021-05-16 DIAGNOSIS — E1142 Type 2 diabetes mellitus with diabetic polyneuropathy: Secondary | ICD-10-CM

## 2021-05-16 DIAGNOSIS — Z9889 Other specified postprocedural states: Secondary | ICD-10-CM

## 2021-05-16 NOTE — Progress Notes (Signed)
Subjective: ?Robert Santiago is a 71 y.o. male patient seen today in office for POV #2 (DOS 05/05/2021) status post left fourth toe amputation and transmetatarsal amputation on the right foot performed by Dr. Blenda Mounts at [facility]. Patient reports that he is doing good and has done what I have told him and stayed off of his feet and has been using surgical shoes as dispensed. ? ?Fasting blood sugar not recorded. ? ?Patient is assisted by wife this visit. ? ?Patient Active Problem List  ? Diagnosis Date Noted  ? Obesity (BMI 30-39.9) 05/07/2021  ? Osteomyelitis of left leg ([place]) 05/05/2021  ? BPH (benign prostatic hyperplasia) 05/04/2021  ? Mixed diabetic hyperlipidemia associated with type 2 diabetes mellitus ([place]) 05/04/2021  ? Osteomyelitis of right foot ([place]) 05/04/2021  ? Cellulitis of right leg 05/04/2021  ? Hypomagnesemia 05/04/2021  ? Acute coronary syndrome ([place]) 12/30/2020  ? Anxiety 12/30/2020  ? Cellulitis of foot 12/30/2020  ? Chest pain 12/30/2020  ? Closed fracture of cuneiform bone of foot 12/30/2020  ? Diabetic ulcer of toe ([place]) 12/30/2020  ? Dyslipidemia 12/30/2020  ? GERD without esophagitis 12/30/2020  ? 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 Osteomyelitis of toe of right foot ([place]) 12/30/2020  ? Palpitations with regular cardiac rhythm 12/30/2020  ? Poorly controlled diabetes mellitus ([place]) 12/30/2020  ? Diabetes mellitus without complication ([place])   ? Acquired hypothyroidism 03/19/2019  ? Atherosclerosis of autologous vein coronary artery bypass graft(s) with unstable angina pectoris ([place]) 03/19/2019  ? Encounter for screening for diabetes mellitus 03/19/2019  ? Status post cardiac catheterization 03/19/2019  ? Type 2 diabetes mellitus with diabetic polyneuropathy, with long-term current use of insulin ([place]) 03/19/2019  ? Coronary artery disease involving native coronary artery of native heart without angina pectoris 03/19/2019  ? Coronary artery disease of native artery of native heart  with stable angina pectoris ([place])   ? Hyperlipidemia   ? Essential hypertension   ? Progressive angina (HCC)-class III 12/19/2018  ? COVID-19 08/2018  ? Hx of CABG 08/23/2014  ? Multiple vessel coronary artery disease 08/21/2014  ? ? ?Current Outpatient Medications on File Prior to Visit  ?Medication Sig Dispense Refill  ? acetaminophen (TYLENOL) 500 MG tablet Take 1,000-1,500 mg by mouth 2 (two) times daily as needed for headache (pain).    ? ALPRAZolam (XANAX) 0.25 MG tablet Take 0.25 mg by mouth daily as needed for anxiety (panic attacks).    ? amoxicillin-clavulanate (AUGMENTIN) 875-125 MG tablet Take 1 tablet by mouth 2 (two) times daily. 28 tablet 0  ? aspirin EC 81 MG tablet Take 81 mg by mouth at bedtime.    ? BD PEN NEEDLE NANO U/F 32G X 4 MM MISC SMARTSIG:1 Each SUB-Q Daily    ? benazepril (LOTENSIN) 20 MG tablet Take 20 mg by mouth every morning.    ? Blood Glucose Monitoring Suppl (ACCU-CHEK GUIDE) w/Device KIT     ? busPIRone (BUSPAR) 5 MG tablet Take 5 mg by mouth 2 (two) times daily.    ? clopidogrel (PLAVIX) 75 MG tablet Take 1 tablet (75 mg total) by mouth daily. (Patient taking differently: Take 75 mg by mouth every morning.) 90 tablet 3  ? doxycycline (VIBRAMYCIN) 100 MG capsule Take 1 capsule (100 mg total) by mouth 2 (two) times daily for 14 days. 28 capsule 0  ? DULoxetine (CYMBALTA) 60 MG capsule 120 mg at bedtime.    ? ezetimibe (ZETIA) 10 MG tablet Take 10 mg by mouth at bedtime.    ?  ibuprofen (ADVIL) 200 MG tablet Take 200 mg by mouth 2 (two) times daily.    ? LANTUS SOLOSTAR 100 UNIT/ML Solostar Pen Inject 48-50 Units into the skin at bedtime.    ? levothyroxine (SYNTHROID) 175 MCG tablet Take 175 mcg by mouth every morning.    ? metFORMIN (GLUCOPHAGE) 1000 MG tablet Take 1,000 mg by mouth 2 (two) times daily.    ? metoprolol tartrate (LOPRESSOR) 25 MG tablet Take 25 mg by mouth 2 (two) times daily.     ? nitroGLYCERIN (NITROSTAT) 0.4 MG SL tablet 0.4 mg every 5 (five) minutes as needed  for chest pain.    ? omeprazole (PRILOSEC) 40 MG capsule Take 40 mg by mouth every morning.    ? polyethylene glycol (MIRALAX / GLYCOLAX) 17 g packet Take 17 g by mouth daily as needed for mild constipation. 14 each 0  ? rosuvastatin (CRESTOR) 10 MG tablet Take 10 mg by mouth at bedtime.    ? tamsulosin (FLOMAX) 0.4 MG CAPS capsule Take 0.4 mg by mouth at bedtime.    ? ?No current facility-administered medications on file prior to visit.  ? ? ?No Known Allergies ? ?Objective: ?There were no vitals filed for this visit. ? ?General: No acute distress, AAOx3  ?Right/Left foot: Sutures intact with a small amount of central-lateral gapping noted at the right TMA stump site that appears to be slightly improved since last week.  No complete dehiscence there is mild swelling noted to the right TMA stump minimal swelling noted to the left fourth toe amputation stump site, minimal maceration, minimal blanchable erythema, no warmth, no drainage, no signs of infection noted, capillary fill time intact all remaining digits of the left foot, no pain or crepitation with range of motion right/left foot.  No pain with calf compression.  ? ? ?Assessment and Plan:  ?Problem List Items Addressed This Visit   ?None ?Visit Diagnoses   ? ? S/P transmetatarsal amputation of foot, right ([place])    -  Primary  ? Ulcer of left foot with necrosis of bone ([place])      ? S/P foot surgery, right      ? S/P foot surgery, left      ? Diabetic polyneuropathy associated with type 2 diabetes mellitus ([place])      ? ?  ?  ? ?-Patient seen and evaluated ?-Dressing change performed ?-Applied dry sterile dressing to surgical site right/left foot secured with ACE wrap and stockinet and advised patient to keep dressings clean dry and intact bilateral. ?-Encourage patient again to stay off of feet as much as possible especially on the right to prevent further gapping or dehiscence to the amputation stump  ?-Continue with rest and elevation to assist with the pain  and edema control ?-Advised patient to continue with oral antibiotic of Augmentin and doxycycline until completed as approximately 5 more days left ?-Will plan for dressing change and possible suture removal on the left at next office visit. In the meantime, patient to call office if any issues or problems arise.  ? ?Landis Martins, DPM  ?

## 2021-05-18 DIAGNOSIS — E1169 Type 2 diabetes mellitus with other specified complication: Secondary | ICD-10-CM | POA: Diagnosis not present

## 2021-05-18 DIAGNOSIS — Z89431 Acquired absence of right foot: Secondary | ICD-10-CM | POA: Diagnosis not present

## 2021-05-18 DIAGNOSIS — I2510 Atherosclerotic heart disease of native coronary artery without angina pectoris: Secondary | ICD-10-CM | POA: Diagnosis not present

## 2021-05-23 ENCOUNTER — Telehealth: Payer: Self-pay | Admitting: *Deleted

## 2021-05-23 NOTE — Telephone Encounter (Signed)
-----   Message from Landis Martins, [place] sent at 05/23/2021  1:33 PM EDT ----- ?No he does not need to start the amoxicillin. He will follow up on 5/12 for me to decide if he needs more antibiotics. If anything gets more red, warm, swollen over the next few days call us back. ?----- Message ----- ?From: Viviana Simpler, PMAC ?Sent: 05/23/2021  12:57 PM EDT ?To: Landis Martins, DPM ? ?Patient called and stated that he was given antibiotics when he was in the hospital and has run out and did not know if he needed any more and if he did he has a whole bottle of amoxicillin and should he start to take these. Lattie Haw ? ? ?

## 2021-05-23 NOTE — Telephone Encounter (Signed)
Called and spoke with the patient and relayed the message per Dr Stover. Cam[place] Ninh °

## 2021-05-26 ENCOUNTER — Ambulatory Visit (INDEPENDENT_AMBULATORY_CARE_PROVIDER_SITE_OTHER): Payer: Medicare HMO | Admitting: Sports Medicine

## 2021-05-26 ENCOUNTER — Encounter: Payer: Self-pay | Admitting: Sports Medicine

## 2021-05-26 DIAGNOSIS — Z9889 Other specified postprocedural states: Secondary | ICD-10-CM

## 2021-05-26 DIAGNOSIS — E1142 Type 2 diabetes mellitus with diabetic polyneuropathy: Secondary | ICD-10-CM

## 2021-05-26 DIAGNOSIS — Z89431 Acquired absence of right foot: Secondary | ICD-10-CM

## 2021-05-26 DIAGNOSIS — L97524 Non-pressure chronic ulcer of other part of left foot with necrosis of bone: Secondary | ICD-10-CM

## 2021-05-26 NOTE — Progress Notes (Signed)
Subjective: ?Robert Santiago is a 71 y.o. male patient seen today in office for POV #3 (DOS 05/05/2021) status post left fourth toe amputation and transmetatarsal amputation on the right foot performed by Dr. Blenda Mounts at [facility]. Patient reports that he has some soreness on the bottom of the right foot and is concerned about potential rubbing of the left second toe so used a Band-Aid to this area.  Patient states that he may have been up on his foot too much they were doing yard work recently.  Patient denies any other constitutional symptoms or problems at this time. ? ?Fasting blood sugar not recorded. ? ?Patient is assisted by wife this visit. ? ?Patient Active Problem List  ? Diagnosis Date Noted  ? Obesity (BMI 30-39.9) 05/07/2021  ? Osteomyelitis of left leg ([place]) 05/05/2021  ? BPH (benign prostatic hyperplasia) 05/04/2021  ? Mixed diabetic hyperlipidemia associated with type 2 diabetes mellitus ([place]) 05/04/2021  ? Osteomyelitis of right foot ([place]) 05/04/2021  ? Cellulitis of right leg 05/04/2021  ? Hypomagnesemia 05/04/2021  ? Acute coronary syndrome ([place]) 12/30/2020  ? Anxiety 12/30/2020  ? Cellulitis of foot 12/30/2020  ? Chest pain 12/30/2020  ? Closed fracture of cuneiform bone of foot 12/30/2020  ? Diabetic ulcer of toe ([place]) 12/30/2020  ? Dyslipidemia 12/30/2020  ? GERD without esophagitis 12/30/2020  ? 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 Osteomyelitis of toe of right foot ([place]) 12/30/2020  ? Palpitations with regular cardiac rhythm 12/30/2020  ? Poorly controlled diabetes mellitus ([place]) 12/30/2020  ? Diabetes mellitus without complication ([place])   ? Acquired hypothyroidism 03/19/2019  ? Atherosclerosis of autologous vein coronary artery bypass graft(s) with unstable angina pectoris ([place]) 03/19/2019  ? Encounter for screening for diabetes mellitus 03/19/2019  ? Status post cardiac catheterization 03/19/2019  ? Type 2 diabetes mellitus with diabetic polyneuropathy, with long-term current use of  insulin ([place]) 03/19/2019  ? Coronary artery disease involving native coronary artery of native heart without angina pectoris 03/19/2019  ? Coronary artery disease of native artery of native heart with stable angina pectoris ([place])   ? Hyperlipidemia   ? Essential hypertension   ? Progressive angina (HCC)-class III 12/19/2018  ? COVID-19 08/2018  ? Hx of CABG 08/23/2014  ? Multiple vessel coronary artery disease 08/21/2014  ? ? ?Current Outpatient Medications on File Prior to Visit  ?Medication Sig Dispense Refill  ? acetaminophen (TYLENOL) 500 MG tablet Take 1,000-1,500 mg by mouth 2 (two) times daily as needed for headache (pain).    ? ALPRAZolam (XANAX) 0.25 MG tablet Take 0.25 mg by mouth daily as needed for anxiety (panic attacks).    ? amoxicillin-clavulanate (AUGMENTIN) 875-125 MG tablet Take 1 tablet by mouth 2 (two) times daily. 28 tablet 0  ? aspirin EC 81 MG tablet Take 81 mg by mouth at bedtime.    ? BD PEN NEEDLE NANO U/F 32G X 4 MM MISC SMARTSIG:1 Each SUB-Q Daily    ? benazepril (LOTENSIN) 20 MG tablet Take 20 mg by mouth every morning.    ? Blood Glucose Monitoring Suppl (ACCU-CHEK GUIDE) w/Device KIT     ? busPIRone (BUSPAR) 5 MG tablet Take 5 mg by mouth 2 (two) times daily.    ? clopidogrel (PLAVIX) 75 MG tablet Take 1 tablet (75 mg total) by mouth daily. (Patient taking differently: Take 75 mg by mouth every morning.) 90 tablet 3  ? DULoxetine (CYMBALTA) 60 MG capsule 120 mg at bedtime.    ? ezetimibe (ZETIA) 10 MG  tablet Take 10 mg by mouth at bedtime.    ? ibuprofen (ADVIL) 200 MG tablet Take 200 mg by mouth 2 (two) times daily.    ? LANTUS SOLOSTAR 100 UNIT/ML Solostar Pen Inject 48-50 Units into the skin at bedtime.    ? levothyroxine (SYNTHROID) 175 MCG tablet Take 175 mcg by mouth every morning.    ? metFORMIN (GLUCOPHAGE) 1000 MG tablet Take 1,000 mg by mouth 2 (two) times daily.    ? metoprolol tartrate (LOPRESSOR) 25 MG tablet Take 25 mg by mouth 2 (two) times daily.     ? nitroGLYCERIN  (NITROSTAT) 0.4 MG SL tablet 0.4 mg every 5 (five) minutes as needed for chest pain.    ? omeprazole (PRILOSEC) 40 MG capsule Take 40 mg by mouth every morning.    ? polyethylene glycol (MIRALAX / GLYCOLAX) 17 g packet Take 17 g by mouth daily as needed for mild constipation. 14 each 0  ? rosuvastatin (CRESTOR) 10 MG tablet Take 10 mg by mouth at bedtime.    ? tamsulosin (FLOMAX) 0.4 MG CAPS capsule Take 0.4 mg by mouth at bedtime.    ? ?No current facility-administered medications on file prior to visit.  ? ? ?No Known Allergies ? ?Objective: ?There were no vitals filed for this visit. ? ?General: No acute distress, AAOx3  ?Right/Left foot: Sutures intact with a small amount of central-lateral gapping noted at the right TMA stump site that appears to be slightly improved since last week.  No complete dehiscence there is mild swelling noted to the right TMA stump minimal swelling noted to the left fourth toe amputation stump site, minimal maceration, minimal blanchable erythema, no warmth, no drainage, no signs of infection noted, there is a very tiny scrape noted to the left second toe at the dorsal interphalangeal joint with no surrounding signs of infection, capillary fill time intact, all remaining digits of the left foot, no pain or crepitation with range of motion right/left foot.  No pain with calf compression.  ? ? ?Assessment and Plan:  ?Problem List Items Addressed This Visit   ?None ?Visit Diagnoses   ? ? S/P transmetatarsal amputation of foot, right ([place])    -  Primary  ? Ulcer of left foot with necrosis of bone ([place])      ? S/P foot surgery, right      ? S/P foot surgery, left      ? Diabetic polyneuropathy associated with type 2 diabetes mellitus ([place])      ? ?  ?  ? ?-Patient seen and evaluated ?-Dressing change performed ?-Sutures removed on the left ?-Applied Betadine to the scrape to the left second toe and dispensed a toe spacer to prevent rubbing and advised wife on how to properly change the  spacer every day ?-Applied Prisma to the gapping areas and Betadine with dry sterile dressing to surgical site right/left foot secured with ACE wrap and stockinet and advised patient to keep dressings clean dry and intact bilateral until next office visit  ?-Continue with limited activity and weightbearing ?-Exchanged left surgical shoe to a shoe that fits properly that does not allow the toes to overhang and possibly rub ?-Continue with rest and elevation to assist with the pain and edema control ?-Will plan for dressing change and possible suture removal on the on the right at next office visit. In the meantime, patient to call office if any issues or problems arise.  ? ?Landis Martins, DPM  ?

## 2021-05-29 ENCOUNTER — Telehealth: Payer: Self-pay | Admitting: Cardiology

## 2021-05-29 NOTE — Telephone Encounter (Signed)
?*  STAT* If patient is at the pharmacy, call can be transferred to refill team.   1. Which medications need to be refilled? (please list name of each medication and dose if known) clopidogrel (PLAVIX) 75 MG tablet   2. Which pharmacy/location (including street and city if local pharmacy) is medication to be sent to? [id number] - [place], [place] - [address] [facility] [address] [facility]  3. Do they need a 30 day or 90 day supply? 90  

## 2021-05-30 MED ORDER — CLOPIDOGREL BISULFATE 75 MG PO TABS
75.0000 mg | ORAL_TABLET | Freq: Every day | ORAL | 2 refills | Status: DC
Start: 2021-05-30 — End: 2022-02-22

## 2021-05-31 ENCOUNTER — Ambulatory Visit (INDEPENDENT_AMBULATORY_CARE_PROVIDER_SITE_OTHER): Payer: Medicare HMO | Admitting: Sports Medicine

## 2021-05-31 ENCOUNTER — Encounter: Payer: Self-pay | Admitting: Sports Medicine

## 2021-05-31 DIAGNOSIS — E1142 Type 2 diabetes mellitus with diabetic polyneuropathy: Secondary | ICD-10-CM

## 2021-05-31 DIAGNOSIS — L97524 Non-pressure chronic ulcer of other part of left foot with necrosis of bone: Secondary | ICD-10-CM

## 2021-05-31 DIAGNOSIS — Z89431 Acquired absence of right foot: Secondary | ICD-10-CM

## 2021-05-31 DIAGNOSIS — Z9889 Other specified postprocedural states: Secondary | ICD-10-CM

## 2021-05-31 NOTE — Progress Notes (Signed)
Subjective: ?Robert Santiago is a 71 y.o. male patient seen today in office for POV #4 (DOS 05/05/2021) status post left fourth toe amputation and transmetatarsal amputation on the right foot performed by Dr. Blenda Mounts at [facility]. Patient reports that he is doing good. No pain. Patient denies any other constitutional symptoms or problems at this time. ? ?Fasting blood sugar not recorded. ? ?Patient is assisted by wife this visit. ? ?Patient Active Problem List  ? Diagnosis Date Noted  ? Obesity (BMI 30-39.9) 05/07/2021  ? Osteomyelitis of left leg ([place]) 05/05/2021  ? BPH (benign prostatic hyperplasia) 05/04/2021  ? Mixed diabetic hyperlipidemia associated with type 2 diabetes mellitus ([place]) 05/04/2021  ? Osteomyelitis of right foot ([place]) 05/04/2021  ? Cellulitis of right leg 05/04/2021  ? Hypomagnesemia 05/04/2021  ? Acute coronary syndrome ([place]) 12/30/2020  ? Anxiety 12/30/2020  ? Cellulitis of foot 12/30/2020  ? Chest pain 12/30/2020  ? Closed fracture of cuneiform bone of foot 12/30/2020  ? Diabetic ulcer of toe ([place]) 12/30/2020  ? Dyslipidemia 12/30/2020  ? GERD without esophagitis 12/30/2020  ? 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 Osteomyelitis of toe of right foot ([place]) 12/30/2020  ? Palpitations with regular cardiac rhythm 12/30/2020  ? Poorly controlled diabetes mellitus ([place]) 12/30/2020  ? Diabetes mellitus without complication ([place])   ? Acquired hypothyroidism 03/19/2019  ? Atherosclerosis of autologous vein coronary artery bypass graft(s) with unstable angina pectoris ([place]) 03/19/2019  ? Encounter for screening for diabetes mellitus 03/19/2019  ? Status post cardiac catheterization 03/19/2019  ? Type 2 diabetes mellitus with diabetic polyneuropathy, with long-term current use of insulin ([place]) 03/19/2019  ? Coronary artery disease involving native coronary artery of native heart without angina pectoris 03/19/2019  ? Coronary artery disease of native artery of native heart with stable angina pectoris  ([place])   ? Hyperlipidemia   ? Essential hypertension   ? Progressive angina (HCC)-class III 12/19/2018  ? COVID-19 08/2018  ? Hx of CABG 08/23/2014  ? Multiple vessel coronary artery disease 08/21/2014  ? ? ?Current Outpatient Medications on File Prior to Visit  ?Medication Sig Dispense Refill  ? acetaminophen (TYLENOL) 500 MG tablet Take 1,000-1,500 mg by mouth 2 (two) times daily as needed for headache (pain).    ? ALPRAZolam (XANAX) 0.25 MG tablet Take 0.25 mg by mouth daily as needed for anxiety (panic attacks).    ? amoxicillin-clavulanate (AUGMENTIN) 875-125 MG tablet Take 1 tablet by mouth 2 (two) times daily. 28 tablet 0  ? aspirin EC 81 MG tablet Take 81 mg by mouth at bedtime.    ? BD PEN NEEDLE NANO U/F 32G X 4 MM MISC SMARTSIG:1 Each SUB-Q Daily    ? benazepril (LOTENSIN) 20 MG tablet Take 20 mg by mouth every morning.    ? Blood Glucose Monitoring Suppl (ACCU-CHEK GUIDE) w/Device KIT     ? busPIRone (BUSPAR) 5 MG tablet Take 5 mg by mouth 2 (two) times daily.    ? clopidogrel (PLAVIX) 75 MG tablet Take 1 tablet (75 mg total) by mouth daily. 90 tablet 2  ? DULoxetine (CYMBALTA) 60 MG capsule 120 mg at bedtime.    ? ezetimibe (ZETIA) 10 MG tablet Take 10 mg by mouth at bedtime.    ? ibuprofen (ADVIL) 200 MG tablet Take 200 mg by mouth 2 (two) times daily.    ? LANTUS SOLOSTAR 100 UNIT/ML Solostar Pen Inject 48-50 Units into the skin at bedtime.    ? levothyroxine (SYNTHROID) 175 MCG tablet Take  175 mcg by mouth every morning.    ? metFORMIN (GLUCOPHAGE) 1000 MG tablet Take 1,000 mg by mouth 2 (two) times daily.    ? metoprolol tartrate (LOPRESSOR) 25 MG tablet Take 25 mg by mouth 2 (two) times daily.     ? nitroGLYCERIN (NITROSTAT) 0.4 MG SL tablet 0.4 mg every 5 (five) minutes as needed for chest pain.    ? omeprazole (PRILOSEC) 40 MG capsule Take 40 mg by mouth every morning.    ? polyethylene glycol (MIRALAX / GLYCOLAX) 17 g packet Take 17 g by mouth daily as needed for mild constipation. 14 each 0  ?  rosuvastatin (CRESTOR) 10 MG tablet Take 10 mg by mouth at bedtime.    ? tamsulosin (FLOMAX) 0.4 MG CAPS capsule Take 0.4 mg by mouth at bedtime.    ? ?No current facility-administered medications on file prior to visit.  ? ? ?No Known Allergies ? ?Objective: ?There were no vitals filed for this visit. ? ?General: No acute distress, AAOx3  ?Right/Left foot: Staples and sutures intact with a small amount of central-lateral gapping noted at the right TMA stump site that appears to be slightly improved since last week.  No complete dehiscence there is mild swelling noted to the right TMA stump, minimal swelling noted to the left fourth toe amputation stump site, minimal maceration, minimal blanchable erythema, no warmth, no drainage, no signs of infection noted, No pain with palpation, No pain with calf compression.  ? ? ?Assessment and Plan:  ?Problem List Items Addressed This Visit   ?None ?Visit Diagnoses   ? ? S/P transmetatarsal amputation of foot, right ([place])    -  Primary  ? Ulcer of left foot with necrosis of bone ([place])      ? S/P foot surgery, right      ? S/P foot surgery, left      ? Diabetic polyneuropathy associated with type 2 diabetes mellitus ([place])      ? ?  ?  ? ?-Patient seen and evaluated ?-Dressing change performed ?-Sutures removed on the right, staples left intact on the right foot ?-Applied Prisma to the gapping area and Betadine at right with dry sterile dressing to surgical site right and left foot secured with ACE wrap and stockinet and advised patient to keep dressings clean dry and intact bilateral until next office visit  ?-Continue with limited activity and weightbearing with surgical shoes ?-Continue with rest and elevation to assist with the pain and edema control ?-Will plan for dressing change and possible staple removal on the on the right at next office visit. In the meantime, patient to call office if any issues or problems arise.  ? ?Landis Martins, DPM  ?

## 2021-06-07 ENCOUNTER — Encounter: Payer: Self-pay | Admitting: Sports Medicine

## 2021-06-07 ENCOUNTER — Ambulatory Visit (INDEPENDENT_AMBULATORY_CARE_PROVIDER_SITE_OTHER): Payer: Medicare HMO | Admitting: Sports Medicine

## 2021-06-07 DIAGNOSIS — E1142 Type 2 diabetes mellitus with diabetic polyneuropathy: Secondary | ICD-10-CM

## 2021-06-07 DIAGNOSIS — Z9889 Other specified postprocedural states: Secondary | ICD-10-CM

## 2021-06-07 DIAGNOSIS — Z89431 Acquired absence of right foot: Secondary | ICD-10-CM

## 2021-06-07 NOTE — Progress Notes (Signed)
Subjective: Robert Santiago is a 71 y.o. male patient seen today in office for POV #5 (DOS 05/05/2021) status post left fourth toe amputation and transmetatarsal amputation on the right foot performed by Dr. Blenda Mounts at [facility]. Patient reports that he is doing a lot better and the cushion has been helping on the left. Patient denies any other constitutional symptoms or problems at this time.  Fasting blood sugar not recorded.  Patient is assisted by wife this visit.  Patient Active Problem List   Diagnosis Date Noted   Obesity (BMI 30-39.9) 05/07/2021   Osteomyelitis of left leg ([place]) 05/05/2021   BPH (benign prostatic hyperplasia) 05/04/2021   Mixed diabetic hyperlipidemia associated with type 2 diabetes mellitus ([place]) 05/04/2021   Osteomyelitis of right foot ([place]) 05/04/2021   Cellulitis of right leg 05/04/2021   Hypomagnesemia 05/04/2021   Acute coronary syndrome ([place]) 12/30/2020   Anxiety 12/30/2020   Cellulitis of foot 12/30/2020   Chest pain 12/30/2020   Closed fracture of cuneiform bone of foot 12/30/2020   Diabetic ulcer of toe ([place]) 12/30/2020   Dyslipidemia 12/30/2020   GERD without esophagitis 12/30/2020   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   Osteomyelitis of toe of right foot ([place]) 12/30/2020   Palpitations with regular cardiac rhythm 12/30/2020   Poorly controlled diabetes mellitus ([place]) 12/30/2020   Diabetes mellitus without complication ([place])    Acquired hypothyroidism 03/19/2019   Atherosclerosis of autologous vein coronary artery bypass graft(s) with unstable angina pectoris ([place]) 03/19/2019   Encounter for screening for diabetes mellitus 03/19/2019   Status post cardiac catheterization 03/19/2019   Type 2 diabetes mellitus with diabetic polyneuropathy, with long-term current use of insulin ([place]) 03/19/2019   Coronary artery disease involving native coronary artery of native heart without angina pectoris 03/19/2019   Coronary artery disease of native artery  of native heart with stable angina pectoris ([place])    Hyperlipidemia    Essential hypertension    Progressive angina (HCC)-class III 12/19/2018   COVID-19 08/2018   Hx of CABG 08/23/2014   Multiple vessel coronary artery disease 08/21/2014    Current Outpatient Medications on File Prior to Visit  Medication Sig Dispense Refill   acetaminophen (TYLENOL) 500 MG tablet Take 1,000-1,500 mg by mouth 2 (two) times daily as needed for headache (pain).     ALPRAZolam (XANAX) 0.25 MG tablet Take 0.25 mg by mouth daily as needed for anxiety (panic attacks).     amoxicillin-clavulanate (AUGMENTIN) 875-125 MG tablet Take 1 tablet by mouth 2 (two) times daily. 28 tablet 0   aspirin EC 81 MG tablet Take 81 mg by mouth at bedtime.     BD PEN NEEDLE NANO U/F 32G X 4 MM MISC SMARTSIG:1 Each SUB-Q Daily     benazepril (LOTENSIN) 20 MG tablet Take 20 mg by mouth every morning.     Blood Glucose Monitoring Suppl (ACCU-CHEK GUIDE) w/Device KIT      busPIRone (BUSPAR) 5 MG tablet Take 5 mg by mouth 2 (two) times daily.     clopidogrel (PLAVIX) 75 MG tablet Take 1 tablet (75 mg total) by mouth daily. 90 tablet 2   DULoxetine (CYMBALTA) 60 MG capsule 120 mg at bedtime.     ezetimibe (ZETIA) 10 MG tablet Take 10 mg by mouth at bedtime.     ibuprofen (ADVIL) 200 MG tablet Take 200 mg by mouth 2 (two) times daily.     LANTUS SOLOSTAR 100 UNIT/ML Solostar Pen Inject 48-50 Units into the skin at bedtime.  levothyroxine (SYNTHROID) 175 MCG tablet Take 175 mcg by mouth every morning.     metFORMIN (GLUCOPHAGE) 1000 MG tablet Take 1,000 mg by mouth 2 (two) times daily.     metoprolol tartrate (LOPRESSOR) 25 MG tablet Take 25 mg by mouth 2 (two) times daily.      nitroGLYCERIN (NITROSTAT) 0.4 MG SL tablet 0.4 mg every 5 (five) minutes as needed for chest pain.     omeprazole (PRILOSEC) 40 MG capsule Take 40 mg by mouth every morning.     polyethylene glycol (MIRALAX / GLYCOLAX) 17 g packet Take 17 g by mouth daily as  needed for mild constipation. 14 each 0   rosuvastatin (CRESTOR) 10 MG tablet Take 10 mg by mouth at bedtime.     tamsulosin (FLOMAX) 0.4 MG CAPS capsule Take 0.4 mg by mouth at bedtime.     No current facility-administered medications on file prior to visit.    No Known Allergies  Objective: There were no vitals filed for this visit.  General: No acute distress, AAOx3  Right/Left foot: Staples intact with a small amount of central-lateral gapping noted at the right TMA stump site that appears to be improving.  No complete dehiscence, there is mild swelling noted to the right TMA stump,  left fourth toe amputation stump site healing well, no swelling, no warmth, no drainage, no signs of infection noted, Asymptomatic hallux valgus with 2nd toe impingement on the left, No pain with palpation, No pain with calf compression.    Assessment and Plan:  Problem List Items Addressed This Visit   None Visit Diagnoses     S/P transmetatarsal amputation of foot, right ([place])    -  Primary   S/P foot surgery, right       S/P foot surgery, left       Diabetic polyneuropathy associated with type 2 diabetes mellitus ([place])            -Patient seen and evaluated -Dressing change performed -Staples removed on the right TMA site except centrally where there is still a small gap -Applied Steristrips and Prisma to the gapping area at right with dry sterile dressing to surgical site  -Dressings are no longer needed on the left; recommend clean/dry/sock -Continue with limited activity and weightbearing with surgical shoes -Continue with rest and elevation to assist with the pain and edema control -Continue with toe spacer left 1st webspace -Will plan for dressing change and finishing staples on the right at next office visit. In the meantime, patient to call office if any issues or problems arise.   Landis Martins, DPM

## 2021-06-09 ENCOUNTER — Encounter: Payer: Medicare HMO | Admitting: Sports Medicine

## 2021-06-14 ENCOUNTER — Ambulatory Visit (INDEPENDENT_AMBULATORY_CARE_PROVIDER_SITE_OTHER): Payer: Medicare HMO | Admitting: Sports Medicine

## 2021-06-14 ENCOUNTER — Encounter: Payer: Self-pay | Admitting: Sports Medicine

## 2021-06-14 DIAGNOSIS — Z9889 Other specified postprocedural states: Secondary | ICD-10-CM

## 2021-06-14 DIAGNOSIS — E1142 Type 2 diabetes mellitus with diabetic polyneuropathy: Secondary | ICD-10-CM

## 2021-06-14 DIAGNOSIS — Z89431 Acquired absence of right foot: Secondary | ICD-10-CM

## 2021-06-14 NOTE — Progress Notes (Signed)
Subjective: Robert Santiago is a 71 y.o. male patient seen today in office for POV #6 (DOS 05/05/2021) status post left fourth toe amputation and transmetatarsal amputation on the right foot performed by Dr. Blenda Mounts at [facility]. Patient reports that he is doing good and states that the left one has a scab when wearing a normal shoe but still continue with surgical shoe on the right.  Fasting blood sugar not recorded.  Patient is assisted by wife this visit who reports that his right foot may have gotten wet with the shower bag breaking.  Patient Active Problem List   Diagnosis Date Noted   Obesity (BMI 30-39.9) 05/07/2021   Osteomyelitis of left leg ([place]) 05/05/2021   BPH (benign prostatic hyperplasia) 05/04/2021   Mixed diabetic hyperlipidemia associated with type 2 diabetes mellitus ([place]) 05/04/2021   Osteomyelitis of right foot ([place]) 05/04/2021   Cellulitis of right leg 05/04/2021   Hypomagnesemia 05/04/2021   Acute coronary syndrome ([place]) 12/30/2020   Anxiety 12/30/2020   Cellulitis of foot 12/30/2020   Chest pain 12/30/2020   Closed fracture of cuneiform bone of foot 12/30/2020   Diabetic ulcer of toe ([place]) 12/30/2020   Dyslipidemia 12/30/2020   GERD without esophagitis 12/30/2020   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   Osteomyelitis of toe of right foot ([place]) 12/30/2020   Palpitations with regular cardiac rhythm 12/30/2020   Poorly controlled diabetes mellitus ([place]) 12/30/2020   Diabetes mellitus without complication ([place])    Acquired hypothyroidism 03/19/2019   Atherosclerosis of autologous vein coronary artery bypass graft(s) with unstable angina pectoris ([place]) 03/19/2019   Encounter for screening for diabetes mellitus 03/19/2019   Status post cardiac catheterization 03/19/2019   Type 2 diabetes mellitus with diabetic polyneuropathy, with long-term current use of insulin ([place]) 03/19/2019   Coronary artery disease involving native coronary artery of native heart without  angina pectoris 03/19/2019   Coronary artery disease of native artery of native heart with stable angina pectoris ([place])    Hyperlipidemia    Essential hypertension    Progressive angina (HCC)-class III 12/19/2018   COVID-19 08/2018   Hx of CABG 08/23/2014   Multiple vessel coronary artery disease 08/21/2014    Current Outpatient Medications on File Prior to Visit  Medication Sig Dispense Refill   aspirin EC 81 MG tablet Take 81 mg by mouth at bedtime.     acetaminophen (TYLENOL) 500 MG tablet Take 1,000-1,500 mg by mouth 2 (two) times daily as needed for headache (pain).     ALPRAZolam (XANAX) 0.25 MG tablet Take 0.25 mg by mouth daily as needed for anxiety (panic attacks).     BD PEN NEEDLE NANO U/F 32G X 4 MM MISC SMARTSIG:1 Each SUB-Q Daily     benazepril (LOTENSIN) 20 MG tablet Take 20 mg by mouth every morning.     Blood Glucose Monitoring Suppl (ACCU-CHEK GUIDE) w/Device KIT      busPIRone (BUSPAR) 5 MG tablet Take 5 mg by mouth 2 (two) times daily.     clopidogrel (PLAVIX) 75 MG tablet Take 1 tablet (75 mg total) by mouth daily. 90 tablet 2   DULoxetine (CYMBALTA) 60 MG capsule 120 mg at bedtime.     ezetimibe (ZETIA) 10 MG tablet Take 10 mg by mouth at bedtime.     ibuprofen (ADVIL) 200 MG tablet Take 200 mg by mouth 2 (two) times daily.     LANTUS SOLOSTAR 100 UNIT/ML Solostar Pen Inject 48-50 Units into the skin at bedtime.  levothyroxine (SYNTHROID) 175 MCG tablet Take 175 mcg by mouth every morning.     metFORMIN (GLUCOPHAGE) 1000 MG tablet Take 1,000 mg by mouth 2 (two) times daily.     metoprolol tartrate (LOPRESSOR) 25 MG tablet Take 25 mg by mouth 2 (two) times daily.      nitroGLYCERIN (NITROSTAT) 0.4 MG SL tablet 0.4 mg every 5 (five) minutes as needed for chest pain.     omeprazole (PRILOSEC) 40 MG capsule Take 40 mg by mouth every morning.     polyethylene glycol (MIRALAX / GLYCOLAX) 17 g packet Take 17 g by mouth daily as needed for mild constipation. 14 each 0    rosuvastatin (CRESTOR) 10 MG tablet Take 10 mg by mouth at bedtime.     tamsulosin (FLOMAX) 0.4 MG CAPS capsule Take 0.4 mg by mouth at bedtime.     No current facility-administered medications on file prior to visit.    No Known Allergies  Objective: There were no vitals filed for this visit.  General: No acute distress, AAOx3  Right/Left foot: Remaining Staples intact with a minimal central-lateral gapping noted at the right TMA stump site that appears to be almost resolved, no complete dehiscence, there is maceration at the plantar flap, mild swelling noted to the right TMA stump,  left fourth toe amputation stump site healing well, no swelling, no warmth, no drainage, no signs of infection noted, Asymptomatic hallux valgus with 2nd toe impingement on the left, No pain with palpation, No pain with calf compression.    Assessment and Plan:  Problem List Items Addressed This Visit   None Visit Diagnoses     S/P transmetatarsal amputation of foot, right ([place])    -  Primary   S/P foot surgery, right       S/P foot surgery, left       Diabetic polyneuropathy associated with type 2 diabetes mellitus ([place])            -Patient seen and evaluated -Dressing change performed -Staples removed on the right TMA site and applied Betadine and dry dressing and advised patient to keep clean dry and intact reinforce or change if only the dressing accidentally gets wet -Must continue with surgical shoe on the right -Dressings are no longer needed on the left; recommend clean/dry/sock and normal shoe to tolerance on the left  -Continue with rest and elevation to assist with the pain and edema control like before -Continue with toe spacer left 1st webspace to prevent the first toe from rubbing onto the second toe -Will plan for right foot check and possibly discontinuing dressings if area is healed next visit and getting patient arrange for new diabetic shoe and insole measurements since he is status  post amputation now with Aaron Edelman.  In the meantime, patient to call office if any issues or problems arise.   Landis Martins, DPM

## 2021-06-19 ENCOUNTER — Ambulatory Visit (INDEPENDENT_AMBULATORY_CARE_PROVIDER_SITE_OTHER): Payer: Medicare HMO | Admitting: Podiatry

## 2021-06-19 ENCOUNTER — Encounter: Payer: Self-pay | Admitting: Podiatry

## 2021-06-19 DIAGNOSIS — Z9889 Other specified postprocedural states: Secondary | ICD-10-CM

## 2021-06-19 DIAGNOSIS — Z89431 Acquired absence of right foot: Secondary | ICD-10-CM

## 2021-06-19 DIAGNOSIS — E1142 Type 2 diabetes mellitus with diabetic polyneuropathy: Secondary | ICD-10-CM

## 2021-06-19 NOTE — Progress Notes (Signed)
Subjective: Robert Santiago is a 71 y.o. male patient seen today in office for POV #7 (DOS 05/05/2021) status post left fourth toe amputation and transmetatarsal amputation on the right foot. Relates left is all healed and right is doing better.   Fasting blood sugar not recorded.    Patient Active Problem List   Diagnosis Date Noted   Obesity (BMI 30-39.9) 05/07/2021   Osteomyelitis of left leg ([place]) 05/05/2021   BPH (benign prostatic hyperplasia) 05/04/2021   Mixed diabetic hyperlipidemia associated with type 2 diabetes mellitus ([place]) 05/04/2021   Osteomyelitis of right foot ([place]) 05/04/2021   Cellulitis of right leg 05/04/2021   Hypomagnesemia 05/04/2021   Acute coronary syndrome ([place]) 12/30/2020   Anxiety 12/30/2020   Cellulitis of foot 12/30/2020   Chest pain 12/30/2020   Closed fracture of cuneiform bone of foot 12/30/2020   Diabetic ulcer of toe ([place]) 12/30/2020   Dyslipidemia 12/30/2020   GERD without esophagitis 12/30/2020   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   Osteomyelitis of toe of right foot ([place]) 12/30/2020   Palpitations with regular cardiac rhythm 12/30/2020   Poorly controlled diabetes mellitus ([place]) 12/30/2020   Diabetes mellitus without complication ([place])    Acquired hypothyroidism 03/19/2019   Atherosclerosis of autologous vein coronary artery bypass graft(s) with unstable angina pectoris ([place]) 03/19/2019   Encounter for screening for diabetes mellitus 03/19/2019   Status post cardiac catheterization 03/19/2019   Type 2 diabetes mellitus with diabetic polyneuropathy, with long-term current use of insulin ([place]) 03/19/2019   Coronary artery disease involving native coronary artery of native heart without angina pectoris 03/19/2019   Coronary artery disease of native artery of native heart with stable angina pectoris ([place])    Hyperlipidemia    Essential hypertension    Progressive angina (HCC)-class III 12/19/2018   COVID-19 08/2018   Hx of CABG 08/23/2014    Multiple vessel coronary artery disease 08/21/2014    Current Outpatient Medications on File Prior to Visit  Medication Sig Dispense Refill   acetaminophen (TYLENOL) 500 MG tablet Take 1,000-1,500 mg by mouth 2 (two) times daily as needed for headache (pain).     ALPRAZolam (XANAX) 0.25 MG tablet Take 0.25 mg by mouth daily as needed for anxiety (panic attacks).     aspirin EC 81 MG tablet Take 81 mg by mouth at bedtime.     BD PEN NEEDLE NANO U/F 32G X 4 MM MISC SMARTSIG:1 Each SUB-Q Daily     benazepril (LOTENSIN) 20 MG tablet Take 20 mg by mouth every morning.     Blood Glucose Monitoring Suppl (ACCU-CHEK GUIDE) w/Device KIT      busPIRone (BUSPAR) 5 MG tablet Take 5 mg by mouth 2 (two) times daily.     clopidogrel (PLAVIX) 75 MG tablet Take 1 tablet (75 mg total) by mouth daily. 90 tablet 2   DULoxetine (CYMBALTA) 60 MG capsule 120 mg at bedtime.     ezetimibe (ZETIA) 10 MG tablet Take 10 mg by mouth at bedtime.     ibuprofen (ADVIL) 200 MG tablet Take 200 mg by mouth 2 (two) times daily.     LANTUS SOLOSTAR 100 UNIT/ML Solostar Pen Inject 48-50 Units into the skin at bedtime.     levothyroxine (SYNTHROID) 175 MCG tablet Take 175 mcg by mouth every morning.     metFORMIN (GLUCOPHAGE) 1000 MG tablet Take 1,000 mg by mouth 2 (two) times daily.     metoprolol tartrate (LOPRESSOR) 25 MG tablet Take 25 mg by mouth 2 (two) times  daily.      nitroGLYCERIN (NITROSTAT) 0.4 MG SL tablet 0.4 mg every 5 (five) minutes as needed for chest pain.     omeprazole (PRILOSEC) 40 MG capsule Take 40 mg by mouth every morning.     polyethylene glycol (MIRALAX / GLYCOLAX) 17 g packet Take 17 g by mouth daily as needed for mild constipation. 14 each 0   rosuvastatin (CRESTOR) 10 MG tablet Take 10 mg by mouth at bedtime.     tamsulosin (FLOMAX) 0.4 MG CAPS capsule Take 0.4 mg by mouth at bedtime.     No current facility-administered medications on file prior to visit.    No Known  Allergies  Objective: There were no vitals filed for this visit.  General: No acute distress, AAOx3  Right/Left foot: Right TMA site well healed no areas of dehiscence noted. Left fourth toe amputation stump site healing well, no swelling, no warmth, no drainage, no signs of infection noted, Asymptomatic hallux valgus with 2nd toe impingement on the left, No pain with palpation, No pain with calf compression.    Assessment and Plan:  Problem List Items Addressed This Visit   None Visit Diagnoses     S/P transmetatarsal amputation of foot, right ([place])    -  Primary   S/P foot surgery, left             -Patient seen and evaluated -Dressing change performed Advised to keep neosporin and bandaid for next week over the small area and keep an eye on it but otherwise may continue with showers as normal.  -May continue with surgical shoe on the right -Dressings are no longer needed on the left; recommend clean/dry/sock and normal shoe to tolerance on the left  -Continue with rest and elevation to assist with the pain and edema control like before -Continue with toe spacer left 1st webspace to prevent the first toe from rubbing onto the second toe -Will arrange for DM shoe fitting.  Return in 2 weeks for foot check.   Lorenda Peck, DPM

## 2021-06-20 DIAGNOSIS — I10 Essential (primary) hypertension: Secondary | ICD-10-CM | POA: Diagnosis not present

## 2021-06-20 DIAGNOSIS — Z89431 Acquired absence of right foot: Secondary | ICD-10-CM | POA: Diagnosis not present

## 2021-06-20 DIAGNOSIS — I2510 Atherosclerotic heart disease of native coronary artery without angina pectoris: Secondary | ICD-10-CM | POA: Diagnosis not present

## 2021-06-20 DIAGNOSIS — N529 Male erectile dysfunction, unspecified: Secondary | ICD-10-CM | POA: Diagnosis not present

## 2021-06-20 DIAGNOSIS — F3341 Major depressive disorder, recurrent, in partial remission: Secondary | ICD-10-CM | POA: Diagnosis not present

## 2021-06-20 DIAGNOSIS — E1169 Type 2 diabetes mellitus with other specified complication: Secondary | ICD-10-CM | POA: Diagnosis not present

## 2021-07-03 ENCOUNTER — Encounter: Payer: Self-pay | Admitting: Podiatry

## 2021-07-03 ENCOUNTER — Ambulatory Visit (INDEPENDENT_AMBULATORY_CARE_PROVIDER_SITE_OTHER): Payer: Medicare HMO | Admitting: Podiatry

## 2021-07-03 DIAGNOSIS — Z89431 Acquired absence of right foot: Secondary | ICD-10-CM

## 2021-07-03 DIAGNOSIS — Z9889 Other specified postprocedural states: Secondary | ICD-10-CM

## 2021-07-03 NOTE — Progress Notes (Signed)
Subjective: Robert Santiago is a 71 y.o. male patient seen today in office for POV #8 (DOS 05/05/2021) status post left fourth toe amputation and transmetatarsal amputation on the right foot. Relates left is all healed and right is doing better.   Fasting blood sugar not recorded.    Patient Active Problem List   Diagnosis Date Noted   Obesity (BMI 30-39.9) 05/07/2021   Osteomyelitis of left leg ([place]) 05/05/2021   BPH (benign prostatic hyperplasia) 05/04/2021   Mixed diabetic hyperlipidemia associated with type 2 diabetes mellitus ([place]) 05/04/2021   Osteomyelitis of right foot ([place]) 05/04/2021   Cellulitis of right leg 05/04/2021   Hypomagnesemia 05/04/2021   Acute coronary syndrome ([place]) 12/30/2020   Anxiety 12/30/2020   Cellulitis of foot 12/30/2020   Chest pain 12/30/2020   Closed fracture of cuneiform bone of foot 12/30/2020   Diabetic ulcer of toe ([place]) 12/30/2020   Dyslipidemia 12/30/2020   GERD without esophagitis 12/30/2020   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   Osteomyelitis of toe of right foot ([place]) 12/30/2020   Palpitations with regular cardiac rhythm 12/30/2020   Poorly controlled diabetes mellitus ([place]) 12/30/2020   Diabetes mellitus without complication ([place])    Acquired hypothyroidism 03/19/2019   Atherosclerosis of autologous vein coronary artery bypass graft(s) with unstable angina pectoris ([place]) 03/19/2019   Encounter for screening for diabetes mellitus 03/19/2019   Status post cardiac catheterization 03/19/2019   Type 2 diabetes mellitus with diabetic polyneuropathy, with long-term current use of insulin ([place]) 03/19/2019   Coronary artery disease involving native coronary artery of native heart without angina pectoris 03/19/2019   Coronary artery disease of native artery of native heart with stable angina pectoris ([place])    Hyperlipidemia    Essential hypertension    Progressive angina (HCC)-class III 12/19/2018   COVID-19 08/2018   Hx of CABG 08/23/2014    Multiple vessel coronary artery disease 08/21/2014    Current Outpatient Medications on File Prior to Visit  Medication Sig Dispense Refill   acetaminophen (TYLENOL) 500 MG tablet Take 1,000-1,500 mg by mouth 2 (two) times daily as needed for headache (pain).     ALPRAZolam (XANAX) 0.25 MG tablet Take 0.25 mg by mouth daily as needed for anxiety (panic attacks).     aspirin EC 81 MG tablet Take 81 mg by mouth at bedtime.     BD PEN NEEDLE NANO U/F 32G X 4 MM MISC SMARTSIG:1 Each SUB-Q Daily     benazepril (LOTENSIN) 20 MG tablet Take 20 mg by mouth every morning.     Blood Glucose Monitoring Suppl (ACCU-CHEK GUIDE) w/Device KIT      busPIRone (BUSPAR) 5 MG tablet Take 5 mg by mouth 2 (two) times daily.     clopidogrel (PLAVIX) 75 MG tablet Take 1 tablet (75 mg total) by mouth daily. 90 tablet 2   DULoxetine (CYMBALTA) 60 MG capsule 120 mg at bedtime.     ezetimibe (ZETIA) 10 MG tablet Take 10 mg by mouth at bedtime.     ibuprofen (ADVIL) 200 MG tablet Take 200 mg by mouth 2 (two) times daily.     LANTUS SOLOSTAR 100 UNIT/ML Solostar Pen Inject 48-50 Units into the skin at bedtime.     levothyroxine (SYNTHROID) 175 MCG tablet Take 175 mcg by mouth every morning.     metFORMIN (GLUCOPHAGE) 1000 MG tablet Take 1,000 mg by mouth 2 (two) times daily.     metoprolol tartrate (LOPRESSOR) 25 MG tablet Take 25 mg by mouth 2 (two) times  daily.      nitroGLYCERIN (NITROSTAT) 0.4 MG SL tablet 0.4 mg every 5 (five) minutes as needed for chest pain.     omeprazole (PRILOSEC) 40 MG capsule Take 40 mg by mouth every morning.     polyethylene glycol (MIRALAX / GLYCOLAX) 17 g packet Take 17 g by mouth daily as needed for mild constipation. 14 each 0   rosuvastatin (CRESTOR) 10 MG tablet Take 10 mg by mouth at bedtime.     tamsulosin (FLOMAX) 0.4 MG CAPS capsule Take 0.4 mg by mouth at bedtime.     No current facility-administered medications on file prior to visit.    No Known  Allergies  Objective: There were no vitals filed for this visit.  General: No acute distress, AAOx3  Right/Left foot: Right TMA site well healed no areas of dehiscence noted. Left fourth toe amputation stump site healing well, no swelling, no warmth, no drainage, no signs of infection noted, Asymptomatic hallux valgus with 2nd toe impingement on the left, No pain with palpation, No pain with calf compression.    Assessment and Plan:  Problem List Items Addressed This Visit   None Visit Diagnoses     S/P transmetatarsal amputation of foot, right ([place])    -  Primary   S/P foot surgery, left              -Patient seen and evaluated -Dressing change performed Advised to keep neosporin and bandaid for next week over the small area and keep an eye on it but otherwise may continue with showers as normal.  -May continue with surgical shoe on the right -Dressings are no longer needed on the left; recommend clean/dry/sock and normal shoe to tolerance on the left  -Continue with rest and elevation to assist with the pain and edema control like before -Continue with toe spacer left 1st webspace to prevent the first toe from rubbing onto the second toe -Awaiting DM shoes  Return in 6 weeks for foot check.   Lorenda Peck, DPM

## 2021-07-12 ENCOUNTER — Ambulatory Visit (INDEPENDENT_AMBULATORY_CARE_PROVIDER_SITE_OTHER): Payer: Medicare HMO | Admitting: Podiatry

## 2021-07-12 DIAGNOSIS — Z9889 Other specified postprocedural states: Secondary | ICD-10-CM

## 2021-07-12 DIAGNOSIS — E1142 Type 2 diabetes mellitus with diabetic polyneuropathy: Secondary | ICD-10-CM

## 2021-07-12 DIAGNOSIS — Z89431 Acquired absence of right foot: Secondary | ICD-10-CM

## 2021-07-12 NOTE — Progress Notes (Signed)
Patient presented for foam casting for 1 pair custom diabetic shoe inserts. Patient is measured with a brannok device to be a size 13 wide  Diabetic shoes are chosen from the safe step catalog and the shoes chosen are [id number]  The patient will be contacted when the shoes and inserts are ready to be picked up

## 2021-07-28 ENCOUNTER — Ambulatory Visit: Payer: Medicare HMO | Admitting: Cardiology

## 2021-08-14 ENCOUNTER — Ambulatory Visit (INDEPENDENT_AMBULATORY_CARE_PROVIDER_SITE_OTHER): Payer: Medicare HMO | Admitting: Podiatry

## 2021-08-14 ENCOUNTER — Encounter: Payer: Self-pay | Admitting: Podiatry

## 2021-08-14 ENCOUNTER — Other Ambulatory Visit: Payer: Self-pay

## 2021-08-14 DIAGNOSIS — Z89431 Acquired absence of right foot: Secondary | ICD-10-CM

## 2021-08-14 DIAGNOSIS — E1142 Type 2 diabetes mellitus with diabetic polyneuropathy: Secondary | ICD-10-CM

## 2021-08-14 DIAGNOSIS — Z9889 Other specified postprocedural states: Secondary | ICD-10-CM | POA: Diagnosis not present

## 2021-08-14 NOTE — Progress Notes (Signed)
Subjective: Robert Santiago is a 71 y.o. male patient seen today in office (DOS 05/05/2021) status post left fourth toe amputation and transmetatarsal amputation on the right foot. Relates doing very well and not having any issues today.   Fasting blood sugar not recorded.    Patient Active Problem List   Diagnosis Date Noted   Obesity (BMI 30-39.9) 05/07/2021   Osteomyelitis of left leg ([place]) 05/05/2021   BPH (benign prostatic hyperplasia) 05/04/2021   Mixed diabetic hyperlipidemia associated with type 2 diabetes mellitus ([place]) 05/04/2021   Osteomyelitis of right foot ([place]) 05/04/2021   Cellulitis of right leg 05/04/2021   Hypomagnesemia 05/04/2021   Acute coronary syndrome ([place]) 12/30/2020   Anxiety 12/30/2020   Cellulitis of foot 12/30/2020   Chest pain 12/30/2020   Closed fracture of cuneiform bone of foot 12/30/2020   Diabetic ulcer of toe ([place]) 12/30/2020   Dyslipidemia 12/30/2020   GERD without esophagitis 12/30/2020   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   Osteomyelitis of toe of right foot ([place]) 12/30/2020   Palpitations with regular cardiac rhythm 12/30/2020   Poorly controlled diabetes mellitus ([place]) 12/30/2020   Diabetes mellitus without complication ([place])    Acquired hypothyroidism 03/19/2019   Atherosclerosis of autologous vein coronary artery bypass graft(s) with unstable angina pectoris ([place]) 03/19/2019   Encounter for screening for diabetes mellitus 03/19/2019   Status post cardiac catheterization 03/19/2019   Type 2 diabetes mellitus with diabetic polyneuropathy, with long-term current use of insulin ([place]) 03/19/2019   Coronary artery disease involving native coronary artery of native heart without angina pectoris 03/19/2019   Coronary artery disease of native artery of native heart with stable angina pectoris ([place])    Hyperlipidemia    Essential hypertension    Progressive angina (HCC)-class III 12/19/2018   COVID-19 08/2018   Hx of CABG 08/23/2014   Multiple  vessel coronary artery disease 08/21/2014    Current Outpatient Medications on File Prior to Visit  Medication Sig Dispense Refill   acetaminophen (TYLENOL) 500 MG tablet Take 1,000-1,500 mg by mouth 2 (two) times daily as needed for headache (pain).     ALPRAZolam (XANAX) 0.25 MG tablet Take 0.25 mg by mouth daily as needed for anxiety (panic attacks).     aspirin EC 81 MG tablet Take 81 mg by mouth at bedtime.     BD PEN NEEDLE NANO U/F 32G X 4 MM MISC SMARTSIG:1 Each SUB-Q Daily     benazepril (LOTENSIN) 20 MG tablet Take 20 mg by mouth every morning.     Blood Glucose Monitoring Suppl (ACCU-CHEK GUIDE) w/Device KIT      busPIRone (BUSPAR) 5 MG tablet Take 5 mg by mouth 2 (two) times daily.     clopidogrel (PLAVIX) 75 MG tablet Take 1 tablet (75 mg total) by mouth daily. 90 tablet 2   DULoxetine (CYMBALTA) 60 MG capsule 120 mg at bedtime.     ezetimibe (ZETIA) 10 MG tablet Take 10 mg by mouth at bedtime.     ibuprofen (ADVIL) 200 MG tablet Take 200 mg by mouth 2 (two) times daily.     LANTUS SOLOSTAR 100 UNIT/ML Solostar Pen Inject 48-50 Units into the skin at bedtime.     levothyroxine (SYNTHROID) 175 MCG tablet Take 175 mcg by mouth every morning.     metFORMIN (GLUCOPHAGE) 1000 MG tablet Take 1,000 mg by mouth 2 (two) times daily.     metoprolol tartrate (LOPRESSOR) 25 MG tablet Take 25 mg by mouth 2 (two) times daily.  nitroGLYCERIN (NITROSTAT) 0.4 MG SL tablet 0.4 mg every 5 (five) minutes as needed for chest pain.     omeprazole (PRILOSEC) 40 MG capsule Take 40 mg by mouth every morning.     polyethylene glycol (MIRALAX / GLYCOLAX) 17 g packet Take 17 g by mouth daily as needed for mild constipation. 14 each 0   rosuvastatin (CRESTOR) 10 MG tablet Take 10 mg by mouth at bedtime.     tadalafil (CIALIS) 10 MG tablet Take 10 mg by mouth daily as needed.     tamsulosin (FLOMAX) 0.4 MG CAPS capsule Take 0.4 mg by mouth at bedtime.     No current facility-administered medications on  file prior to visit.    No Known Allergies  Objective: There were no vitals filed for this visit.  General: No acute distress, AAOx3  Right/Left foot: Right TMA site well healed no areas of dehiscence noted. Left fourth toe amputation stump site healing well, no swelling, no warmth, no drainage, no signs of infection noted, Asymptomatic hallux valgus with 2nd toe impingement on the left, No pain with palpation, No pain with calf compression.    Assessment and Plan:  Problem List Items Addressed This Visit   None       -Patient seen and evaluated -No dressings needed.   -May continue in regular shoes awaiting DM shoes.  -Dressings are no longer needed; recommend clean/dry/sock and normal shoe to tolerance on the left  -Continue with rest and elevation to assist with the pain and edema control like before -Continue with toe spacer left 1st webspace to prevent the first toe from rubbing onto the second toe Return in 63month for rfc.   RLorenda Peck DPM

## 2021-08-14 NOTE — Progress Notes (Unsigned)
Cardiology Office Note:    Date:  08/15/2021   ID:  Robert Santiago, DOB 1950-07-17, MRN [id number]  PCP:  Cyndi Bender, PA-C  Cardiologist:  [name], MD    Referring MD: Cyndi Bender, PA-C    ASSESSMENT:    1. Coronary artery disease of native artery of native heart with stable angina pectoris ([place])   2. Essential hypertension   3. Mixed hyperlipidemia   4. Type 2 diabetes mellitus without complication, without long-term current use of insulin ([place])   5. LV dysfunction    PLAN:    In order of problems listed above:  Robert Santiago continues to do well stable chronic CAD not having anginal discomfort continue his current medical regimen dual antiplatelet beta-blocker and lipid-lowering combined with rosuvastatin and Zetia. Stable his blood pressure is relatively low standing like him to reduce his ACE inhibitor 50% last check blood pressure at home daily sit to stand Continue to repeat lipid profile performed continue his combined therapy Stable diabetes managed by his PCP Mild LV dysfunction continue ACE inhibitor beta-blocker   Next appointment: 9 months   Medication Adjustments/Labs and Tests Ordered: Current medicines are reviewed at length with the patient today.  Concerns regarding medicines are outlined above.  No orders of the defined types were placed in this encounter.  No orders of the defined types were placed in this encounter.   Chief Complaint  Patient presents with   Follow-up   Coronary Artery Disease    History of Present Illness:    Robert Santiago is a 71 y.o. male with a hx of coronary artery disease coronary artery bypass surgery 08/23/2014 with a left thoracic artery anastomosed to the left anterior descending vein graft to the first diagonal vein graft to the ramus and vein graft to an obtuse marginal and vein graft to the right sided posterior lateral branch.  Other problems include hypertension hyperlipidemia and superficial thrombophlebitis of  lower extremity.  He seen 10/05/2020.  He was having typical exertional anginal equivalent dyspnea and underwent myocardial perfusion study for risk stratification.  The study was reported as abnormal and with shared decision making he was referred for coronary angiogram coronary angiography performed 02/08/18 and 2 of his 5 grafts to be occluded with patent left thoracic artery to the LAD and a vein graft to the diagonal and marginal branch. The ejection fraction of the range of 45% with lateral hypokinesia he had severe native coronary artery disease with subtotal occlusion of left main left anterior descending ramus and left circumflex trifurcation. He was felt to be best treated medically.  He was last seen 10/12/2020 with stable chronic CAD having no anginal discomfort on current medical therapy.. Compliance with diet, lifestyle and medications: Yes  Overall he is feeling better no angina edema dyspnea palpitation or syncope At times he is lightheaded standing Tolerates lipid-lowering without muscle pain or weakness He is compliant with his medications and pleased with the quality of his life Past Medical History:  Diagnosis Date   Acquired hypothyroidism 03/19/2019   Acute coronary syndrome ([place]) 12/30/2020   Anxiety 12/30/2020   Atherosclerosis of autologous vein coronary artery bypass graft(s) with unstable angina pectoris ([place]) 03/19/2019   BPH (benign prostatic hyperplasia) 05/04/2021   Cellulitis of foot 12/30/2020   Cellulitis of right leg 05/04/2021   Chest pain 12/30/2020   Closed fracture of cuneiform bone of foot 12/30/2020   Coronary artery disease involving native coronary artery of native heart with angina pectoris ([place]) 03/19/2019  Cath 08/20/14: Conclusions Diagnostic Procedure Summary 1. Severe, diffuse multi-vessel CAD 2. Normal LV function Diagnostic Procedure Recommendations CTS consult Angiographic findings Cardiac Arteries and Lesion Findings LMCA: Normal. LAD: Lesion on Prox  LAD: 80% stenosis. Lesion on Dist LAD: 80% stenosis. LCx: Lesion on Prox CX: Proximal subsection.80% stenosis. Lesion on 1st Ob Marg: 80% stenos   Coronary artery disease involving native coronary artery of native heart without angina pectoris 03/19/2019   Cath 08/20/14: Conclusions Diagnostic Procedure Summary 1. Severe, diffuse multi-vessel CAD 2. Normal LV function Diagnostic Procedure Recommendations CTS consult Angiographic findings Cardiac Arteries and Lesion Findings LMCA: Normal. LAD: Lesion on Prox LAD: 80% stenosis. Lesion on Dist LAD: 80% stenosis. LCx: Lesion on Prox CX: Proximal subsection.80% stenosis. Lesion on 1st Ob Marg: 80% stenos   Coronary artery disease of native artery of native heart with stable angina pectoris ([place])    COVID-19 08/2018   Diabetes mellitus without complication ([place])    Diabetic ulcer of toe ([place]) 12/30/2020   Dyslipidemia 12/30/2020   Encounter for screening for diabetes mellitus 03/19/2019   IMOUPDATE   Essential hypertension    GERD without esophagitis 12/30/2020   Hx of CABG 08/23/2014   (Dr. Darryl Nestle) CABG x5: LIMA-LAD, SVG-D1, SVG-RI, SVG-OM 1, SVG-RPL.   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  LM-normal.  LAD: Proximal 80%, distal 80%; LCx: Proximal 80%, OM1 80%. -Normal EF. -->  Referred for CABG   Obesity (BMI 30-39.9) 05/07/2021   Osteomyelitis of left leg ([place]) 05/05/2021   Osteomyelitis of right foot ([place]) 05/04/2021   Osteomyelitis of toe of right foot ([place]) 12/30/2020   Palpitations with regular cardiac rhythm 12/30/2020   Poorly controlled diabetes mellitus ([place]) 12/30/2020   Progressive angina (HCC)-class III 12/19/2018   Status post cardiac catheterization 03/19/2019   Type 2  diabetes mellitus with diabetic polyneuropathy, with long-term current use of insulin ([place]) 03/19/2019   Type 2 diabetes mellitus without complication ([place]) 02/23/5282    Past Surgical History:  Procedure Laterality Date   ACOUSTIC NEUROMA RESECTION     AMPUTATION Right 05/05/2021   Procedure: AMPUTATION FOOT;  Surgeon: Lorenda Peck, MD;  Location: [facility];  Service: Podiatry;  Laterality: Right;  Surgeon will do local block   AMPUTATION TOE Left 05/05/2021   Procedure: AMPUTATION TOE;  Surgeon: Lorenda Peck, MD;  Location: [facility];  Service: Podiatry;  Laterality: Left;  Surgeon will do local block   APPENDECTOMY     CARDIAC CATHETERIZATION  08/21/2014   Right dominant -RCA: Proximal 95, mid 95, distal 95.  LM-normal.  LAD: Proximal 80%, distal 80%; LCx: Proximal 80%, OM1 80%. -Normal EF. -->  Referred for CABG   CORONARY ARTERY BYPASS GRAFT  08/23/2014   (Dr. Darryl Nestle) CABG x5: LIMA-LAD, SVG-D1, SVG-RI, SVG-OM 1, SVG-RPL.   LEFT HEART CATH AND CORS/GRAFTS ANGIOGRAPHY N/A 02/09/2019   Procedure: LEFT HEART CATH AND CORS/GRAFTS ANGIOGRAPHY;  Surgeon: Leonie Man, MD;  Location: [facility];  Service: Cardiovascular;  Laterality: N/A;   TRANSTHORACIC ECHOCARDIOGRAM  08/2014   ([place]) mild LVH, EF 55% with no R WMA.  Normal valves.    Current Medications: Current Meds  Medication Sig   acetaminophen (TYLENOL) 500 MG tablet Take 1,000-1,500 mg by mouth 2 (two) times  daily as needed for headache (pain).   ALPRAZolam (XANAX) 0.25 MG tablet Take 0.25 mg by mouth daily as needed for anxiety (panic attacks).   aspirin EC 81 MG tablet Take 81 mg by mouth at bedtime.   benazepril (LOTENSIN) 20 MG tablet Take 20 mg by mouth every morning.   busPIRone (BUSPAR) 5 MG tablet Take 5 mg by mouth 2 (two) times daily.   clopidogrel (PLAVIX) 75 MG tablet Take 1 tablet (75 mg total) by mouth daily.   DULoxetine (CYMBALTA) 60 MG capsule Take 120 mg by mouth at bedtime.   ezetimibe  (ZETIA) 10 MG tablet Take 10 mg by mouth at bedtime.   ibuprofen (ADVIL) 200 MG tablet Take 200 mg by mouth 2 (two) times daily.   LANTUS SOLOSTAR 100 UNIT/ML Solostar Pen Inject 48-50 Units into the skin at bedtime.   levothyroxine (SYNTHROID) 175 MCG tablet Take 175 mcg by mouth every morning.   metFORMIN (GLUCOPHAGE) 1000 MG tablet Take 1,000 mg by mouth 2 (two) times daily.   metoprolol tartrate (LOPRESSOR) 25 MG tablet Take 25 mg by mouth 2 (two) times daily.    nitroGLYCERIN (NITROSTAT) 0.4 MG SL tablet 0.4 mg every 5 (five) minutes as needed for chest pain.   omeprazole (PRILOSEC) 40 MG capsule Take 40 mg by mouth every morning.   rosuvastatin (CRESTOR) 10 MG tablet Take 10 mg by mouth at bedtime.   tadalafil (CIALIS) 10 MG tablet Take 10 mg by mouth daily as needed for erectile dysfunction.   tamsulosin (FLOMAX) 0.4 MG CAPS capsule Take 0.4 mg by mouth at bedtime.     Allergies:   Patient has no known allergies.   Social History   Socioeconomic History   Marital status: Married    Spouse name: Not on file   Number of children: Not on file   Years of education: Not on file   Highest education level: Not on file  Occupational History   Not on file  Tobacco Use   Smoking status: Former    Packs/day: 1.00    Years: 30.00    Total pack years: 30.00    Types: Cigarettes    Quit date: 2003    Years since quitting: 20.5   Smokeless tobacco: Never  Vaping Use   Vaping Use: Never used  Substance and Sexual Activity   Alcohol use: Yes    Comment: occ   Drug use: Never   Sexual activity: Not on file  Other Topics Concern   Not on file  Social History Narrative   Not on file   Social Determinants of Health   Financial Resource Strain: Not on file  Food Insecurity: Not on file  Transportation Needs: Not on file  Physical Activity: Not on file  Stress: Not on file  Social Connections: Not on file     Family History: The patient's family history includes COPD in his  mother; Heart attack in his brother; Heart disease in his brother, brother, brother, and father; Heart failure in his mother; Hyperlipidemia in his father and mother; Hypertension in his father and mother. ROS:   Please see the history of present illness.    All other systems reviewed and are negative.  EKGs/Labs/Other Studies Reviewed:    The following studies were reviewed today:    Recent Labs: 05/04/2021: ALT 18 05/05/2021: BUN 15; Creatinine, Ser 0.85; Hemoglobin 12.2; Magnesium 1.8; Platelets 302; Potassium 3.9; Sodium 136  Recent Lipid Panel 06/23/2020 cholesterol 111 LDL 59  05/18/2021 A1c  8.7% hemoglobin 13.2 creatinine 0.79 potassium 4.7   Physical Exam:    VS:  BP 130/68   Pulse 72   Ht 6' (1.829 m)   Wt 265 lb 9.6 oz (120.5 kg)   SpO2 97%   BMI 36.02 kg/m     Wt Readings from Last 3 Encounters:  08/15/21 265 lb 9.6 oz (120.5 kg)  05/04/21 280 lb (127 kg)  10/12/20 292 lb 6.4 oz (132.6 kg)    Repeat blood pressure by me 104/60 sitting 94/60 standing GEN:  Well nourished, well developed in no acute distress HEENT: Normal NECK: No JVD; No carotid bruits LYMPHATICS: No lymphadenopathy CARDIAC: RRR, no murmurs, rubs, gallops RESPIRATORY:  Clear to auscultation without rales, wheezing or rhonchi  ABDOMEN: Soft, non-tender, non-distended MUSCULOSKELETAL:  No edema; No deformity  SKIN: Warm and dry NEUROLOGIC:  Alert and oriented x 3 PSYCHIATRIC:  Normal affect    Signed, [name], MD  08/15/2021 12:59 PM    [facility]

## 2021-08-15 ENCOUNTER — Ambulatory Visit (INDEPENDENT_AMBULATORY_CARE_PROVIDER_SITE_OTHER): Payer: Medicare HMO | Admitting: Cardiology

## 2021-08-15 ENCOUNTER — Encounter: Payer: Self-pay | Admitting: Cardiology

## 2021-08-15 VITALS — BP 130/68 | HR 72 | Ht 72.0 in | Wt 265.6 lb

## 2021-08-15 DIAGNOSIS — I25118 Atherosclerotic heart disease of native coronary artery with other forms of angina pectoris: Secondary | ICD-10-CM

## 2021-08-15 DIAGNOSIS — I10 Essential (primary) hypertension: Secondary | ICD-10-CM

## 2021-08-15 DIAGNOSIS — E119 Type 2 diabetes mellitus without complications: Secondary | ICD-10-CM | POA: Diagnosis not present

## 2021-08-15 DIAGNOSIS — E782 Mixed hyperlipidemia: Secondary | ICD-10-CM

## 2021-08-15 DIAGNOSIS — I519 Heart disease, unspecified: Secondary | ICD-10-CM

## 2021-08-15 MED ORDER — BENAZEPRIL HCL 10 MG PO TABS: 10.0000 mg | ORAL_TABLET | Freq: Every day | ORAL | 3 refills | Status: DC

## 2021-08-15 NOTE — Patient Instructions (Signed)
Medication Instructions:  Your physician has recommended you make the following change in your medication:   START: Lotensin 10 mg daily  *If you need a refill on your cardiac medications before your next appointment, please call your pharmacy*   Lab Work: Your physician recommends that you return for lab work in:   Labs today: Lipids, Lpa  If you have labs (blood work) drawn today and your tests are completely normal, you will receive your results only by: [place] (if you have MyChart) OR A paper copy in the mail If you have any lab test that is abnormal or we need to change your treatment, we will call you to review the results.   Testing/Procedures: None   Follow-Up: At [facility], you and your health needs are our priority.  As part of our continuing mission to provide you with exceptional heart care, we have created designated Provider Care Teams.  These Care Teams include your primary Cardiologist (physician) and Advanced Practice Providers (APPs -  Physician Assistants and Nurse Practitioners) who all work together to provide you with the care you need, when you need it.  We recommend signing up for the patient portal called "MyChart".  Sign up information is provided on this After Visit Summary.  MyChart is used to connect with patients for Virtual Visits (Telemedicine).  Patients are able to view lab/test results, encounter notes, upcoming appointments, etc.  Non-urgent messages can be sent to your provider as well.   To learn more about what you can do with MyChart, go to [email or web address].    Your next appointment:   9 month(s)  The format for your next appointment:   In Person  Provider:   [name], MD    Other Instructions Check blood pressure sitting and standing daily  Important Information About Sugar

## 2021-08-16 LAB — LIPID PANEL
Chol/HDL Ratio: 3 ratio (ref 0.0–5.0)
Cholesterol, Total: 141 mg/dL (ref 100–199)
HDL: 47 mg/dL (ref >39)
LDL Chol Calc (NIH): 69 mg/dL (ref 0–99)
Triglycerides: 142 mg/dL (ref 0–149)
VLDL Cholesterol Cal: 25 mg/dL (ref 5–40)

## 2021-08-16 LAB — LIPOPROTEIN A (LPA): Lipoprotein (a): 238.3 nmol/L — ABNORMAL HIGH (ref <75.0)

## 2021-08-22 DIAGNOSIS — N529 Male erectile dysfunction, unspecified: Secondary | ICD-10-CM | POA: Diagnosis not present

## 2021-08-22 DIAGNOSIS — I2510 Atherosclerotic heart disease of native coronary artery without angina pectoris: Secondary | ICD-10-CM | POA: Diagnosis not present

## 2021-08-22 DIAGNOSIS — E1169 Type 2 diabetes mellitus with other specified complication: Secondary | ICD-10-CM | POA: Diagnosis not present

## 2021-08-22 DIAGNOSIS — F3341 Major depressive disorder, recurrent, in partial remission: Secondary | ICD-10-CM | POA: Diagnosis not present

## 2021-08-22 DIAGNOSIS — Z89431 Acquired absence of right foot: Secondary | ICD-10-CM | POA: Diagnosis not present

## 2021-08-22 DIAGNOSIS — I10 Essential (primary) hypertension: Secondary | ICD-10-CM | POA: Diagnosis not present

## 2021-09-12 NOTE — Telephone Encounter (Signed)
Pt was recasted in June after his amputation but new paperwork has not been received. Will resend today and follow up with patient when paperwork is received.

## 2021-09-12 NOTE — Telephone Encounter (Signed)
Pt states he has been waiting on his diabetic shoes since March and wanted to check the status of this order.  Please advise

## 2021-09-21 ENCOUNTER — Encounter: Payer: Self-pay | Admitting: *Deleted

## 2021-09-21 ENCOUNTER — Telehealth: Payer: Self-pay | Admitting: *Deleted

## 2021-09-21 NOTE — Patient Outreach (Signed)
  Care Coordination   09/21/2021 Name: Robert Santiago MRN: [id number] DOB: [birth year]   Care Coordination Outreach Attempts:  An unsuccessful telephone outreach was attempted today to offer the patient information about available care coordination services as a benefit of their health plan.   Follow Up Plan:  Additional outreach attempts will be made to offer the patient care coordination information and services.   Encounter Outcome:  No Answer  Care Coordination Interventions Activated:  No   Care Coordination Interventions:  No, not indicated    Eduard Clos MSW, LCSW Licensed Clinical Social Worker    [phone]

## 2021-09-22 NOTE — Telephone Encounter (Signed)
Spoke with pt and informed him that we still have not received the paperwork for his shoes. He is going to call his primary office so they are aware that paperwork is coming over to them and I will resend paperwork and change the physician to Dr Cristela Blue L. Hamrick.

## 2021-10-17 NOTE — Telephone Encounter (Signed)
Re-faxed again.

## 2021-10-26 ENCOUNTER — Ambulatory Visit: Payer: Self-pay | Admitting: Licensed Clinical Social Worker

## 2021-10-26 NOTE — Patient Outreach (Signed)
  Care Coordination   Initial Visit Note   10/26/2021 Name: Zimir Kittleson MRN: [id number] DOB: 05-29-1950  Pieter Fooks is a 71 y.o. year old male who sees Cyndi Bender, [place] for primary care. I spoke with  [name] by phone today.  What matters to the patients health and wellness today?  Patient denied services    Goals Addressed               This Visit's Progress     Care Coordination Activities- Patient denied assistance (pt-stated)        Patient denied needing assistance.        SDOH assessments and interventions completed:  Yes     Care Coordination Interventions Activated:  Yes  Care Coordination Interventions:  Yes, provided   Follow up plan: No further intervention required.   Encounter Outcome:  Pt. Visit Completed

## 2021-10-26 NOTE — Patient Outreach (Signed)
  Care Coordination   Chart Review  Visit Note   10/26/2021 Name: Robert Santiago MRN: [id number] DOB: [birth year]  Robert Santiago is a 71 y.o. year old male who sees Cyndi Bender, [place] for primary care. I reviewed chart in preparation for call.   SDOH assessments and interventions completed:  No     Care Coordination Interventions Activated:  Yes  Care Coordination Interventions:  Yes, provided   Follow up plan: Follow up call scheduled for 10/12    Encounter Outcome:  Pt. Visit Completed

## 2021-10-26 NOTE — Patient Instructions (Signed)
Visit Information  Thank you for taking time to visit with me today. Please don't hesitate to contact me if I can be of assistance to you.   Following are the goals we discussed today:   Goals Addressed               This Visit's Progress     Care Coordination Activities- Patient denied assistance (pt-stated)        Patient denied needing assistance.          Patient verbalizes understanding of instructions and care plan provided today and agrees to view in [place]. Active MyChart status and patient understanding of how to access instructions and care plan via MyChart confirmed with patient.     No further follow up required: .  Lenor Derrick, MSW  Social Worker [facility]  [phone]

## 2021-11-13 ENCOUNTER — Ambulatory Visit: Payer: Medicare HMO | Admitting: Podiatry

## 2021-11-14 ENCOUNTER — Ambulatory Visit: Payer: Medicare HMO | Admitting: Podiatry

## 2021-12-27 DIAGNOSIS — H2513 Age-related nuclear cataract, bilateral: Secondary | ICD-10-CM | POA: Diagnosis not present

## 2021-12-27 DIAGNOSIS — E119 Type 2 diabetes mellitus without complications: Secondary | ICD-10-CM | POA: Diagnosis not present

## 2022-01-02 DIAGNOSIS — I2510 Atherosclerotic heart disease of native coronary artery without angina pectoris: Secondary | ICD-10-CM | POA: Diagnosis not present

## 2022-01-02 DIAGNOSIS — E1169 Type 2 diabetes mellitus with other specified complication: Secondary | ICD-10-CM | POA: Diagnosis not present

## 2022-01-02 DIAGNOSIS — Z139 Encounter for screening, unspecified: Secondary | ICD-10-CM | POA: Diagnosis not present

## 2022-01-02 DIAGNOSIS — K219 Gastro-esophageal reflux disease without esophagitis: Secondary | ICD-10-CM | POA: Diagnosis not present

## 2022-01-02 DIAGNOSIS — Z89431 Acquired absence of right foot: Secondary | ICD-10-CM | POA: Diagnosis not present

## 2022-01-02 DIAGNOSIS — Z23 Encounter for immunization: Secondary | ICD-10-CM | POA: Diagnosis not present

## 2022-01-02 DIAGNOSIS — I10 Essential (primary) hypertension: Secondary | ICD-10-CM | POA: Diagnosis not present

## 2022-01-02 DIAGNOSIS — F3341 Major depressive disorder, recurrent, in partial remission: Secondary | ICD-10-CM | POA: Diagnosis not present

## 2022-02-14 DIAGNOSIS — E1169 Type 2 diabetes mellitus with other specified complication: Secondary | ICD-10-CM | POA: Diagnosis not present

## 2022-02-14 DIAGNOSIS — I10 Essential (primary) hypertension: Secondary | ICD-10-CM | POA: Diagnosis not present

## 2022-02-14 DIAGNOSIS — I2510 Atherosclerotic heart disease of native coronary artery without angina pectoris: Secondary | ICD-10-CM | POA: Diagnosis not present

## 2022-02-21 ENCOUNTER — Other Ambulatory Visit: Payer: Self-pay | Admitting: Cardiology

## 2022-03-08 DIAGNOSIS — R6889 Other general symptoms and signs: Secondary | ICD-10-CM | POA: Diagnosis not present

## 2022-03-08 DIAGNOSIS — J069 Acute upper respiratory infection, unspecified: Secondary | ICD-10-CM | POA: Diagnosis not present

## 2022-03-19 DIAGNOSIS — L720 Epidermal cyst: Secondary | ICD-10-CM | POA: Diagnosis not present

## 2022-03-19 DIAGNOSIS — I2510 Atherosclerotic heart disease of native coronary artery without angina pectoris: Secondary | ICD-10-CM | POA: Diagnosis not present

## 2022-03-19 DIAGNOSIS — E1169 Type 2 diabetes mellitus with other specified complication: Secondary | ICD-10-CM | POA: Diagnosis not present

## 2022-03-19 DIAGNOSIS — I10 Essential (primary) hypertension: Secondary | ICD-10-CM | POA: Diagnosis not present

## 2022-03-19 DIAGNOSIS — Z89431 Acquired absence of right foot: Secondary | ICD-10-CM | POA: Diagnosis not present

## 2022-03-19 DIAGNOSIS — Z125 Encounter for screening for malignant neoplasm of prostate: Secondary | ICD-10-CM | POA: Diagnosis not present

## 2022-03-19 DIAGNOSIS — F3341 Major depressive disorder, recurrent, in partial remission: Secondary | ICD-10-CM | POA: Diagnosis not present

## 2022-03-19 DIAGNOSIS — E039 Hypothyroidism, unspecified: Secondary | ICD-10-CM | POA: Diagnosis not present

## 2022-04-23 DIAGNOSIS — Z125 Encounter for screening for malignant neoplasm of prostate: Secondary | ICD-10-CM | POA: Diagnosis not present

## 2022-04-23 DIAGNOSIS — R972 Elevated prostate specific antigen [PSA]: Secondary | ICD-10-CM | POA: Diagnosis not present

## 2022-06-13 ENCOUNTER — Telehealth: Payer: Self-pay | Admitting: Cardiology

## 2022-06-13 NOTE — Telephone Encounter (Signed)
Pt called back. I gave him next available but he states the person who called was going to get him in sooner. Please advise.

## 2022-06-13 NOTE — Telephone Encounter (Signed)
Spoke with spouse. She stated that she called to make appt for yearly follow up with [facility]. Sent to front desk for appt.

## 2022-06-13 NOTE — Telephone Encounter (Signed)
Pt c/o Shortness Of Breath: STAT if SOB developed within the last 24 hours or pt is noticeably SOB on the phone  1. Are you currently SOB (can you hear that pt is SOB on the phone)? No   2. How long have you been experiencing SOB? A little over a month  3. Are you SOB when sitting or when up moving around? Moving around  4. Are you currently experiencing any other symptoms?  Fatigue

## 2022-06-21 ENCOUNTER — Other Ambulatory Visit: Payer: Self-pay

## 2022-06-21 MED ORDER — NITROGLYCERIN 0.4 MG SL SUBL: 0.4000 mg | SUBLINGUAL_TABLET | SUBLINGUAL | 5 refills | Status: AC | PRN

## 2022-06-21 NOTE — Telephone Encounter (Signed)
NTG rx sent to pharmacy

## 2022-06-25 DIAGNOSIS — I10 Essential (primary) hypertension: Secondary | ICD-10-CM | POA: Diagnosis not present

## 2022-06-25 DIAGNOSIS — Z125 Encounter for screening for malignant neoplasm of prostate: Secondary | ICD-10-CM | POA: Diagnosis not present

## 2022-06-25 DIAGNOSIS — Z89431 Acquired absence of right foot: Secondary | ICD-10-CM | POA: Diagnosis not present

## 2022-06-25 DIAGNOSIS — I2510 Atherosclerotic heart disease of native coronary artery without angina pectoris: Secondary | ICD-10-CM | POA: Diagnosis not present

## 2022-06-25 DIAGNOSIS — E039 Hypothyroidism, unspecified: Secondary | ICD-10-CM | POA: Diagnosis not present

## 2022-06-25 DIAGNOSIS — E785 Hyperlipidemia, unspecified: Secondary | ICD-10-CM | POA: Diagnosis not present

## 2022-06-25 DIAGNOSIS — E1169 Type 2 diabetes mellitus with other specified complication: Secondary | ICD-10-CM | POA: Diagnosis not present

## 2022-06-25 DIAGNOSIS — F3341 Major depressive disorder, recurrent, in partial remission: Secondary | ICD-10-CM | POA: Diagnosis not present

## 2022-06-25 DIAGNOSIS — Z9181 History of falling: Secondary | ICD-10-CM | POA: Diagnosis not present

## 2022-07-11 NOTE — Progress Notes (Unsigned)
Cardiology Office Note:    Date:  07/12/2022   ID:  Robert Santiago, DOB Dec 24, 1950, MRN [id number]  PCP:  Lonie Peak, PA-C  Cardiologist:  Norman Herrlich, MD    Referring MD: Lonie Peak, PA-C    ASSESSMENT:    1. Coronary artery disease of native artery of native heart with stable angina pectoris (HCC)   2. Essential hypertension   3. Elevated lipoprotein(a)   4. Type 2 diabetes mellitus without complication, without long-term current use of insulin (HCC)    PLAN:    In order of problems listed above:  Stable CAD following bypass surgery complicated with graft occlusion continue his current dual antiplatelet therapy beta-blocker and combined lipid-lowering with Zetia and high intensity statin Well-controlled continue his current guideline directed antihypertensive medications including ACE inhibitor With recent shortness of breath recheck echo if EF is worsened will transition to Entresto and add SGLT2 inhibitor Recent lipid profile with an LDL of 54 at target continue his current combined treatment Poorly controlled recent A1c 8.9% managed by his PCP   Next appointment: 6 months   Medication Adjustments/Labs and Tests Ordered: Current medicines are reviewed at length with the patient today.  Concerns regarding medicines are outlined above.  Orders Placed This Encounter  Procedures   EKG 12-Lead   No orders of the defined types were placed in this encounter.    History of Present Illness:    Robert Santiago is a 72 y.o. male with a hx of CAD hypertension hyperlipidemia type 2 diabetes LV dysfunction elevated lipoprotein little a last seen 08/15/2021.  He has a historycoronary artery disease coronary artery bypass surgery 08/23/2014 with a left thoracic artery anastomosed to the left anterior descending vein graft to the first diagonal vein graft to the ramus and vein graft to an obtuse marginal and vein graft to the right sided posterior lateral branch.  Most  recentlyhe was referred for coronary angiogram coronary angiography performed 02/08/18 and 2 of his 5 grafts to be occluded with patent left thoracic artery to the LAD and a vein graft to the diagonal and marginal branch. The ejection fraction of the range of 45% with lateral hypokinesia he had severe native coronary artery disease with subtotal occlusion of left main left anterior descending ramus and left circumflex trifurcation. He was felt to be best treated medically.    Compliance with diet, lifestyle and medications: Yes  Recently was not doing well he suffered with depression its improved during that time he found himself to be short of breath with activities and is his depression improved his shortness of breath resolved He is not having edema orthopnea chest pain palpitation or syncope Recent lab his cholesterol 122 LDL 54 A1c 8.9% hemoglobin 13.8 creatinine 0.99 potassium 4.8 06/25/2022 Past Medical History:  Diagnosis Date   Acquired hypothyroidism 03/19/2019   Acute coronary syndrome (HCC) 12/30/2020   Anxiety 12/30/2020   Atherosclerosis of autologous vein coronary artery bypass graft(s) with unstable angina pectoris (HCC) 03/19/2019   BPH (benign prostatic hyperplasia) 05/04/2021   Cellulitis of foot 12/30/2020   Cellulitis of right leg 05/04/2021   Chest pain 12/30/2020   Closed fracture of cuneiform bone of foot 12/30/2020   Coronary artery disease involving native coronary artery of native heart with angina pectoris (HCC) 03/19/2019   Cath 08/20/14: Conclusions Diagnostic Procedure Summary 1. Severe, diffuse multi-vessel CAD 2. Normal LV function Diagnostic Procedure Recommendations CTS consult Angiographic findings Cardiac Arteries and Lesion Findings LMCA: Normal. LAD: Lesion on Prox  LAD: 80% stenosis. Lesion on Dist LAD: 80% stenosis. LCx: Lesion on Prox CX: Proximal subsection.80% stenosis. Lesion on 1st Ob Marg: 80% stenos   Coronary artery disease involving native coronary artery  of native heart without angina pectoris 03/19/2019   Cath 08/20/14: Conclusions Diagnostic Procedure Summary 1. Severe, diffuse multi-vessel CAD 2. Normal LV function Diagnostic Procedure Recommendations CTS consult Angiographic findings Cardiac Arteries and Lesion Findings LMCA: Normal. LAD: Lesion on Prox LAD: 80% stenosis. Lesion on Dist LAD: 80% stenosis. LCx: Lesion on Prox CX: Proximal subsection.80% stenosis. Lesion on 1st Ob Marg: 80% stenos   Coronary artery disease of native artery of native heart with stable angina pectoris (HCC)    COVID-19 08/2018   Diabetes mellitus without complication (HCC)    Diabetic ulcer of toe (HCC) 12/30/2020   Dyslipidemia 12/30/2020   Encounter for screening for diabetes mellitus 03/19/2019   IMOUPDATE   Essential hypertension    GERD without esophagitis 12/30/2020   Hx of CABG 08/23/2014   (Dr. Norma Fredrickson) CABG x5: LIMA-LAD, SVG-D1, SVG-RI, SVG-OM 1, SVG-RPL.   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  LM-normal.  LAD: Proximal 80%, distal 80%; LCx: Proximal 80%, OM1 80%. -Normal EF. -->  Referred for CABG   Obesity (BMI 30-39.9) 05/07/2021   Osteomyelitis of left leg (HCC) 05/05/2021   Osteomyelitis of right foot (HCC) 05/04/2021   Osteomyelitis of toe of right foot (HCC) 12/30/2020   Palpitations with regular cardiac rhythm 12/30/2020   Poorly controlled diabetes mellitus (HCC) 12/30/2020   Progressive angina (HCC)-class III 12/19/2018   Status post cardiac catheterization 03/19/2019   Type 2 diabetes mellitus with diabetic polyneuropathy, with long-term current use of insulin (HCC) 03/19/2019   Type 2 diabetes mellitus without complication (HCC) 03/19/2019    Past Surgical History:  Procedure  Laterality Date   ACOUSTIC NEUROMA RESECTION     AMPUTATION Right 05/05/2021   Procedure: AMPUTATION FOOT;  Surgeon: Louann Sjogren, MD;  Location: [facility];  Service: Podiatry;  Laterality: Right;  Surgeon will do local block   AMPUTATION TOE Left 05/05/2021   Procedure: AMPUTATION TOE;  Surgeon: Louann Sjogren, MD;  Location: [facility];  Service: Podiatry;  Laterality: Left;  Surgeon will do local block   APPENDECTOMY     CARDIAC CATHETERIZATION  08/21/2014   Right dominant -RCA: Proximal 95, mid 95, distal 95.  LM-normal.  LAD: Proximal 80%, distal 80%; LCx: Proximal 80%, OM1 80%. -Normal EF. -->  Referred for CABG   CORONARY ARTERY BYPASS GRAFT  08/23/2014   (Dr. Norma Fredrickson) CABG x5: LIMA-LAD, SVG-D1, SVG-RI, SVG-OM 1, SVG-RPL.   LEFT HEART CATH AND CORS/GRAFTS ANGIOGRAPHY N/A 02/09/2019   Procedure: LEFT HEART CATH AND CORS/GRAFTS ANGIOGRAPHY;  Surgeon: Marykay Lex, MD;  Location: [facility] IN[facility]ASIVE CV [facility];  Service: Cardiovascular;  Laterality: N/A;   TRANSTHORACIC ECHOCARDIOGRAM  08/2014   ([place]) mild LVH, EF 55% with no R WMA.  Normal valves.    Current Medications: Current Meds  Medication Sig   acetaminophen (TYLENOL) 500 MG tablet Take 1,000-1,500 mg by mouth 2 (two) times daily as needed for headache (pain).   ALPRAZolam (XANAX) 0.5 MG tablet Take 0.5 mg by mouth daily as needed for anxiety (panic attacks).   aspirin EC 81 MG tablet Take  81 mg by mouth at bedtime.   benazepril (LOTENSIN) 10 MG tablet Take 1 tablet (10 mg total) by mouth daily. (Patient taking differently: Take 20 mg by mouth daily.)   clopidogrel (PLAVIX) 75 MG tablet Take 1 tablet (75 mg total) by mouth daily.   DULoxetine (CYMBALTA) 60 MG capsule Take 120 mg by mouth at bedtime.   ezetimibe (ZETIA) 10 MG tablet Take 10 mg by mouth at bedtime.   insulin aspart (NOVOLOG FLEXPEN) 100 UNIT/ML FlexPen Inject 10 Units into the skin every evening.   LANTUS SOLOSTAR 100 UNIT/ML Solostar Pen Inject 50-70 Units  into the skin at bedtime.   levothyroxine (SYNTHROID) 175 MCG tablet Take 150 mcg by mouth every morning.   metFORMIN (GLUCOPHAGE) 1000 MG tablet Take 1,000 mg by mouth 2 (two) times daily.   metoprolol tartrate (LOPRESSOR) 25 MG tablet Take 25 mg by mouth 2 (two) times daily.    nitroGLYCERIN (NITROSTAT) 0.4 MG SL tablet Place 1 tablet (0.4 mg total) under the tongue every 5 (five) minutes as needed for chest pain.   omeprazole (PRILOSEC) 40 MG capsule Take 40 mg by mouth every morning.   rosuvastatin (CRESTOR) 10 MG tablet Take 10 mg by mouth at bedtime.   Semaglutide,0.25 or 0.5MG /DOS, (OZEMPIC, 0.25 OR 0.5 MG/DOSE,) 2 MG/1.5ML SOPN Inject 0.25 mg into the skin once a week.   tamsulosin (FLOMAX) 0.4 MG CAPS capsule Take 0.4 mg by mouth at bedtime.   [DISCONTINUED] busPIRone (BUSPAR) 5 MG tablet Take 5 mg by mouth 2 (two) times daily.   [DISCONTINUED] ibuprofen (ADVIL) 200 MG tablet Take 200 mg by mouth 2 (two) times daily.   [DISCONTINUED] polyethylene glycol (MIRALAX / GLYCOLAX) 17 g packet Take 17 g by mouth daily as needed for mild constipation.   [DISCONTINUED] tadalafil (CIALIS) 10 MG tablet Take 10 mg by mouth daily as needed for erectile dysfunction.     Allergies:   Patient has no known allergies.   EKGs/Labs/Other Studies Reviewed:    The following studies were reviewed today:  Cardiac Studies & Procedures   CARDIAC CATHETERIZATION  CARDIAC CATHETERIZATION 02/09/2019  Narrative  -------------NATIVE CORONARY ANGIOGRAPHY------------------  Mid LM to Ost LAD lesion is 90% stenosed with 99% stenosed side branch in Ost Cx to Prox Cx.  Ramus lesion is 95% stenosed.  Ost LAD to Prox LAD lesion is 99% stenosed. Prox LAD to Mid LAD lesion is 100% stenosed.  Ost RCA to Prox RCA lesion is 40% stenosed. Prox RCA to Dist RCA lesion is 100% stenosed with 100% stenosed side branch in RPAV.  -------------GRAFT ANGIOGRAPHY------------------  SVG-RPL graft was not visualized due to  occlusion and inability to cannulate. Origin to Prox Graft lesion is 100% stenosed.  SVG-Ramus graft was not visualized due to occlusion. Origin to Prox Graft lesion is 100% stenosed.  SVG-Diag graft was visualized by angiography and is small. The graft exhibits mild disease.  SVG-OM graft was visualized by angiography and is large. The graft exhibits no disease.  LIMA graft was visualized by angiography and is large. The graft exhibits no disease. (brisk L-R collaterals fill the RPDA with less prominent filling of the RPA V)  There is mild to moderate left ventricular systolic dysfunction.  LV end diastolic pressure is mildly elevated.  The left ventricular ejection fraction is 45-50% by visual estimate.  There is no aortic valve stenosis.  SUMMARY  Severe native coronary artery disease with essentially subtotal occlusion of the Left Main-LAD, Ramus Intermedius (RI) and LCx trifurcation with  severe disease in the proximal part of the RI  2 of 5 grafts occluded (unable to visualize via direct cannulation or root angiogram) with patent LIMA-LAD, SVG-diagonal and SVG-OM (however difficult to tell if this is the OM or ramus that is grafted)  EF does appear to be mildly reduced of roughly 45% with lateral hypokinesis.  No obvious PCI target.  Likely culprit for ischemia with occluded graft to the RI   RECOMMENDATIONS  Discharge home after bedrest  Optimize medical management. ->  Consider antianginal such as ranolazine, amlodipine and Imdur.  Could also potentially titrate up beta-blocker.  Aggressive risk factor modification.    Bryan Lemma, MD  Findings Coronary Findings Diagnostic  Dominance: Right  Left Main Mid LM to Ost LAD lesion is 90% stenosed with 99% stenosed side branch in Ost Cx to Prox Cx. The lesion is located at the bifurcation. The lesion is severely calcified.  Left Anterior Descending Ost LAD to Prox LAD lesion is 99% stenosed. The lesion is severely  calcified. Prox LAD to Mid LAD lesion is 100% stenosed. The lesion is chronically occluded.  First Diagonal Branch Vessel is small in size.  Second Diagonal Branch Vessel is small in size.  Third Diagonal Branch Vessel is small in size.  Third Septal Branch Vessel is small in size.  Left Anterior Descending Ost LAD to Prox LAD lesion is 99% stenosed. The lesion is severely calcified. Prox LAD to Mid LAD lesion is 100% stenosed. The lesion is chronically occluded.  First Diagonal Branch Vessel is small in size.  Second Diagonal Branch Vessel is small in size.  Third Diagonal Branch Vessel is small in size.  Third Septal Branch Vessel is small in size.  Ramus Intermedius There is moderate diffuse disease throughout the vessel. Ramus lesion is 95% stenosed. The lesion is severely calcified.  Left Circumflex Vessel is small. There is severe diffuse disease throughout the vessel.  First Obtuse Marginal Branch  Second Obtuse Marginal Branch Vessel is small in size.  Right Coronary Artery Ost RCA to Prox RCA lesion is 40% stenosed. The lesion is calcified. Prox RCA to Dist RCA lesion is 100% stenosed with 100% stenosed side branch in RPAV.  Acute Marginal Branch Vessel is small in size.  Right Ventricular Branch Vessel is small in size.  Right Posterior Descending Artery Collaterals RPDA filled by collaterals from 3rd Sept.  Collaterals RPDA filled by collaterals from Dist LAD.  Right Posterior Atrioventricular Artery Vessel is small in size. There is moderate disease in the vessel.  First Right Posterolateral Branch There is moderate disease in the vessel. Collaterals 1st RPL filled by collaterals from 3rd Diag.  Saphenous Graft To RPAV SVG graft was not visualized due to known occlusion and inability to cannulate. SVG-RPL Origin to Prox Graft lesion is 100% stenosed. The lesion is chronically occluded.  Saphenous Graft To Ramus SVG graft was not  visualized due to known occlusion. SVG-RI - injected, flush occluded Origin to Prox Graft lesion is 100% stenosed. The lesion is chronically occluded.  Saphenous Graft To 1st Diag SVG graft was visualized by angiography and is small. SVG-Diag The graft exhibits mild .  Saphenous Graft To 1st Mrg SVG graft was visualized by angiography and is large. SVG-OM  LIMA LIMA Graft To Mid LAD LIMA graft was visualized by angiography and is large. The graft exhibits no disease.  Intervention  No interventions have been documented.   STRESS TESTS  MYOCARDIAL PERFUSION IMAGING 01/28/2019  Narrative  Nuclear stress  EF: 54%.  The left ventricular ejection fraction is mildly decreased (45-54%).  There was no ST segment deviation noted during stress.  Defect 1: There is a medium defect of moderate severity present in the basal inferolateral and mid inferolateral location.  Findings consistent with ischemia.  This is an intermediate risk study.               He KG today shows sinus rhythm right bundle branch block nonspecific T waves inferior leads  Recent Labs: No results found for requested labs within last 365 days.  Recent Lipid Panel    Component Value Date/Time   CHOL 141 08/15/2021 1324   TRIG 142 08/15/2021 1324   HDL 47 08/15/2021 1324   CHOLHDL 3.0 08/15/2021 1324   LDLCALC 69 08/15/2021 1324    Physical Exam:    VS:  BP 110/68 (BP Location: Left Arm, Patient Position: Sitting)   Ht 6\' 1"  (1.854 m)   Wt 286 lb (129.7 kg)   SpO2 95%   BMI 37.73 kg/m     Wt Readings from Last 3 Encounters:  07/12/22 286 lb (129.7 kg)  08/15/21 265 lb 9.6 oz (120.5 kg)  05/04/21 280 lb (127 kg)     GEN: Obese BMI greater than 35 well nourished, well developed in no acute distress HEENT: Normal NECK: No JVD; No carotid bruits LYMPHATICS: No lymphadenopathy CARDIAC: RRR, no murmurs, rubs, gallops RESPIRATORY:  Clear to auscultation without rales, wheezing or rhonchi  ABDOMEN:  Soft, non-tender, non-distended MUSCULOSKELETAL:  No edema; No deformity  SKIN: Warm and dry NEUROLOGIC:  Alert and oriented x 3 PSYCHIATRIC:  Normal affect    Signed, Norman Herrlich, MD  07/12/2022 8:42 AM    [facility]

## 2022-07-12 ENCOUNTER — Ambulatory Visit: Payer: Medicare HMO | Attending: Cardiology | Admitting: Cardiology

## 2022-07-12 ENCOUNTER — Encounter: Payer: Self-pay | Admitting: Cardiology

## 2022-07-12 VITALS — BP 110/68 | Ht 73.0 in | Wt 286.0 lb

## 2022-07-12 DIAGNOSIS — I25118 Atherosclerotic heart disease of native coronary artery with other forms of angina pectoris: Secondary | ICD-10-CM

## 2022-07-12 DIAGNOSIS — I10 Essential (primary) hypertension: Secondary | ICD-10-CM

## 2022-07-12 DIAGNOSIS — E7841 Elevated Lipoprotein(a): Secondary | ICD-10-CM

## 2022-07-12 DIAGNOSIS — E119 Type 2 diabetes mellitus without complications: Secondary | ICD-10-CM

## 2022-07-12 NOTE — Patient Instructions (Signed)
Medication Instructions:  Your physician recommends that you continue on your current medications as directed. Please refer to the Current Medication list given to you today.  *If you need a refill on your cardiac medications before your next appointment, please call your pharmacy*   Lab Work: None If you have labs (blood work) drawn today and your tests are completely normal, you will receive your results only by: MyChart Message (if you have MyChart) OR A paper copy in the mail If you have any lab test that is abnormal or we need to change your treatment, we will call you to review the results.   Testing/Procedures: Your physician has requested that you have an echocardiogram. Echocardiography is a painless test that uses sound waves to create images of your heart. It provides your doctor with information about the size and shape of your heart and how well your heart's chambers and valves are working. This procedure takes approximately one hour. There are no restrictions for this procedure. Please do NOT wear cologne, perfume, aftershave, or lotions (deodorant is allowed). Please arrive 15 minutes prior to your appointment time.    Follow-Up: At [facility], you and your health needs are our priority.  As part of our continuing mission to provide you with exceptional heart care, we have created designated Provider Care Teams.  These Care Teams include your primary Cardiologist (physician) and Advanced Practice Providers (APPs -  Physician Assistants and Nurse Practitioners) who all work together to provide you with the care you need, when you need it.  We recommend signing up for the patient portal called "MyChart".  Sign up information is provided on this After Visit Summary.  MyChart is used to connect with patients for Virtual Visits (Telemedicine).  Patients are able to view lab/test results, encounter notes, upcoming appointments, etc.  Non-urgent messages can be sent to your  provider as well.   To learn more about what you can do with MyChart, go to [email or web address].    Your next appointment:   6 month(s)  Provider:   Brian Munley, MD    Other Instructions None  

## 2022-08-06 ENCOUNTER — Ambulatory Visit: Payer: Medicare HMO | Attending: Cardiology

## 2022-08-06 DIAGNOSIS — I25118 Atherosclerotic heart disease of native coronary artery with other forms of angina pectoris: Secondary | ICD-10-CM

## 2022-08-06 DIAGNOSIS — I10 Essential (primary) hypertension: Secondary | ICD-10-CM | POA: Diagnosis not present

## 2022-08-06 DIAGNOSIS — E7841 Elevated Lipoprotein(a): Secondary | ICD-10-CM | POA: Diagnosis not present

## 2022-08-06 DIAGNOSIS — E119 Type 2 diabetes mellitus without complications: Secondary | ICD-10-CM

## 2022-08-06 LAB — ECHOCARDIOGRAM COMPLETE: S' Lateral: 3.7 cm

## 2022-08-06 MED ORDER — PERFLUTREN LIPID MICROSPHERE
1.0000 mL | INTRAVENOUS | Status: AC | PRN
  Administered 2022-08-06: 3 mL via INTRAVENOUS

## 2022-08-07 ENCOUNTER — Telehealth: Payer: Self-pay

## 2022-08-07 NOTE — Telephone Encounter (Signed)
-----   Message from Norman Herrlich sent at 08/07/2022  8:26 AM EDT ----- Resolved echo was normal EF

## 2022-08-07 NOTE — Telephone Encounter (Signed)
Patient notified through my chart.

## 2022-08-18 ENCOUNTER — Other Ambulatory Visit: Payer: Self-pay | Admitting: Cardiology

## 2022-08-22 ENCOUNTER — Other Ambulatory Visit: Payer: Self-pay | Admitting: Cardiology

## 2022-08-22 DIAGNOSIS — Z1331 Encounter for screening for depression: Secondary | ICD-10-CM | POA: Diagnosis not present

## 2022-08-22 DIAGNOSIS — Z0000 Encounter for general adult medical examination without abnormal findings: Secondary | ICD-10-CM | POA: Diagnosis not present

## 2022-08-22 DIAGNOSIS — Z9181 History of falling: Secondary | ICD-10-CM | POA: Diagnosis not present

## 2022-08-27 DIAGNOSIS — I10 Essential (primary) hypertension: Secondary | ICD-10-CM | POA: Diagnosis not present

## 2022-08-27 DIAGNOSIS — Z89431 Acquired absence of right foot: Secondary | ICD-10-CM | POA: Diagnosis not present

## 2022-08-27 DIAGNOSIS — E785 Hyperlipidemia, unspecified: Secondary | ICD-10-CM | POA: Diagnosis not present

## 2022-08-27 DIAGNOSIS — F3341 Major depressive disorder, recurrent, in partial remission: Secondary | ICD-10-CM | POA: Diagnosis not present

## 2022-08-27 DIAGNOSIS — I2510 Atherosclerotic heart disease of native coronary artery without angina pectoris: Secondary | ICD-10-CM | POA: Diagnosis not present

## 2022-08-27 DIAGNOSIS — L720 Epidermal cyst: Secondary | ICD-10-CM | POA: Diagnosis not present

## 2022-08-27 DIAGNOSIS — E039 Hypothyroidism, unspecified: Secondary | ICD-10-CM | POA: Diagnosis not present

## 2022-08-27 DIAGNOSIS — E1169 Type 2 diabetes mellitus with other specified complication: Secondary | ICD-10-CM | POA: Diagnosis not present

## 2022-11-29 DIAGNOSIS — E039 Hypothyroidism, unspecified: Secondary | ICD-10-CM | POA: Diagnosis not present

## 2022-11-29 DIAGNOSIS — I2510 Atherosclerotic heart disease of native coronary artery without angina pectoris: Secondary | ICD-10-CM | POA: Diagnosis not present

## 2022-11-29 DIAGNOSIS — Z89431 Acquired absence of right foot: Secondary | ICD-10-CM | POA: Diagnosis not present

## 2022-11-29 DIAGNOSIS — E785 Hyperlipidemia, unspecified: Secondary | ICD-10-CM | POA: Diagnosis not present

## 2022-11-29 DIAGNOSIS — R972 Elevated prostate specific antigen [PSA]: Secondary | ICD-10-CM | POA: Diagnosis not present

## 2022-11-29 DIAGNOSIS — Z79899 Other long term (current) drug therapy: Secondary | ICD-10-CM | POA: Diagnosis not present

## 2022-11-29 DIAGNOSIS — I10 Essential (primary) hypertension: Secondary | ICD-10-CM | POA: Diagnosis not present

## 2022-11-29 DIAGNOSIS — Z23 Encounter for immunization: Secondary | ICD-10-CM | POA: Diagnosis not present

## 2022-11-29 DIAGNOSIS — F3341 Major depressive disorder, recurrent, in partial remission: Secondary | ICD-10-CM | POA: Diagnosis not present

## 2022-11-29 DIAGNOSIS — E1169 Type 2 diabetes mellitus with other specified complication: Secondary | ICD-10-CM | POA: Diagnosis not present

## 2023-01-18 DIAGNOSIS — I2510 Atherosclerotic heart disease of native coronary artery without angina pectoris: Secondary | ICD-10-CM | POA: Diagnosis not present

## 2023-01-18 DIAGNOSIS — E785 Hyperlipidemia, unspecified: Secondary | ICD-10-CM | POA: Diagnosis not present

## 2023-01-18 DIAGNOSIS — U071 COVID-19: Secondary | ICD-10-CM | POA: Diagnosis not present

## 2023-02-16 ENCOUNTER — Other Ambulatory Visit: Payer: Self-pay | Admitting: Cardiology

## 2023-02-18 NOTE — Telephone Encounter (Signed)
 Rx refill sent to pharmacy.

## 2023-03-13 DIAGNOSIS — Z89431 Acquired absence of right foot: Secondary | ICD-10-CM | POA: Diagnosis not present

## 2023-03-13 DIAGNOSIS — I10 Essential (primary) hypertension: Secondary | ICD-10-CM | POA: Diagnosis not present

## 2023-03-13 DIAGNOSIS — E1169 Type 2 diabetes mellitus with other specified complication: Secondary | ICD-10-CM | POA: Diagnosis not present

## 2023-03-13 DIAGNOSIS — F3341 Major depressive disorder, recurrent, in partial remission: Secondary | ICD-10-CM | POA: Diagnosis not present

## 2023-03-13 DIAGNOSIS — N401 Enlarged prostate with lower urinary tract symptoms: Secondary | ICD-10-CM | POA: Diagnosis not present

## 2023-03-13 DIAGNOSIS — E785 Hyperlipidemia, unspecified: Secondary | ICD-10-CM | POA: Diagnosis not present

## 2023-03-13 DIAGNOSIS — E039 Hypothyroidism, unspecified: Secondary | ICD-10-CM | POA: Diagnosis not present

## 2023-03-13 DIAGNOSIS — I2510 Atherosclerotic heart disease of native coronary artery without angina pectoris: Secondary | ICD-10-CM | POA: Diagnosis not present

## 2023-04-29 ENCOUNTER — Ambulatory Visit: Payer: Medicare HMO | Admitting: Cardiology

## 2023-05-24 ENCOUNTER — Other Ambulatory Visit: Payer: Self-pay | Admitting: Cardiology

## 2023-06-17 DIAGNOSIS — E785 Hyperlipidemia, unspecified: Secondary | ICD-10-CM | POA: Diagnosis not present

## 2023-06-17 DIAGNOSIS — I10 Essential (primary) hypertension: Secondary | ICD-10-CM | POA: Diagnosis not present

## 2023-06-17 DIAGNOSIS — R972 Elevated prostate specific antigen [PSA]: Secondary | ICD-10-CM | POA: Diagnosis not present

## 2023-06-17 DIAGNOSIS — F3341 Major depressive disorder, recurrent, in partial remission: Secondary | ICD-10-CM | POA: Diagnosis not present

## 2023-06-17 DIAGNOSIS — Z23 Encounter for immunization: Secondary | ICD-10-CM | POA: Diagnosis not present

## 2023-06-17 DIAGNOSIS — Z89431 Acquired absence of right foot: Secondary | ICD-10-CM | POA: Diagnosis not present

## 2023-06-17 DIAGNOSIS — I2510 Atherosclerotic heart disease of native coronary artery without angina pectoris: Secondary | ICD-10-CM | POA: Diagnosis not present

## 2023-06-17 DIAGNOSIS — E039 Hypothyroidism, unspecified: Secondary | ICD-10-CM | POA: Diagnosis not present

## 2023-06-17 DIAGNOSIS — E1169 Type 2 diabetes mellitus with other specified complication: Secondary | ICD-10-CM | POA: Diagnosis not present

## 2023-06-18 ENCOUNTER — Encounter: Payer: Self-pay | Admitting: Cardiology

## 2023-06-21 ENCOUNTER — Other Ambulatory Visit: Payer: Self-pay | Admitting: Cardiology

## 2023-07-04 DIAGNOSIS — E875 Hyperkalemia: Secondary | ICD-10-CM | POA: Diagnosis not present

## 2023-07-15 ENCOUNTER — Ambulatory Visit: Admitting: Cardiology

## 2023-07-17 ENCOUNTER — Other Ambulatory Visit: Payer: Self-pay | Admitting: Cardiology

## 2023-08-20 ENCOUNTER — Other Ambulatory Visit: Payer: Self-pay | Admitting: Cardiology

## 2023-09-08 NOTE — Progress Notes (Deleted)
 Cardiology Office Note:    Date:  09/08/2023   ID:  Robert Santiago, DOB 22-Aug-1950, MRN [id number]  PCP:  Montey Lot, PA-C  Cardiologist:  Redell Leiter, MD    Referring MD: Montey Lot, PA-C    ASSESSMENT:    1. Coronary artery disease of native artery of native heart with stable angina pectoris (HCC)   2. Essential hypertension   3. Elevated lipoprotein(a)   4. Type 2 diabetes mellitus with diabetic polyneuropathy, with long-term current use of insulin  (HCC)    PLAN:    In order of problems listed above:  ***   Next appointment: ***   Medication Adjustments/Labs and Tests Ordered: Current medicines are reviewed at length with the patient today.  Concerns regarding medicines are outlined above.  No orders of the defined types were placed in this encounter.  No orders of the defined types were placed in this encounter.    History of Present Illness:    Robert Santiago is a 73 y.o. male with a hx of history of CAD hypertension hyperlipidemia type 2 diabetes mellitus elevated LP(a) and left ventricular dysfunction.  He had coronary bypass surgery 08/23/2014 coronary arteriography in January 2020 showed occlusion of 2 of the 5 grafts with patent left thoracic artery to the LAD ejection fraction in the range of 45% and advised medical therapy.  He was last seen 07/12/2022.  His ejection fraction 08/06/2022 showed normalized EF 60 to 65%.  Recent labs 07/04/2023 hemoglobin 14.7 platelets 255,000 creatinine 0.94 GFR 86 cc/min potassium 5.7 sodium reported cholesterol 153 LDL 83 non-HDL cholesterol 102 I repeat potassium 4.4. 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 Severe, diffuse multi-vessel CAD 2. Normal LV function Diagnostic Procedure Recommendations CTS consult Angiographic findings Cardiac Arteries and Lesion Findings LMCA: Normal. LAD: Lesion on Prox LAD: 80% stenosis. Lesion on Dist LAD: 80% stenosis. LCx: Lesion on Prox CX: Proximal subsection.80% stenosis. Lesion on 1st Ob Marg: 80% stenos   Coronary artery disease involving native coronary artery of native heart without angina pectoris 03/19/2019   Cath 08/20/14: Conclusions Diagnostic Procedure Summary 1. Severe, diffuse multi-vessel CAD 2. Normal LV function Diagnostic Procedure Recommendations CTS consult Angiographic findings Cardiac Arteries and Lesion Findings LMCA: Normal. LAD: Lesion on Prox LAD: 80% stenosis. Lesion on Dist LAD: 80% stenosis. LCx: Lesion on Prox CX: Proximal subsection.80% stenosis. Lesion on 1st Ob Marg: 80% stenos   Coronary artery disease of native artery of native heart with stable angina pectoris (HCC)    COVID-19 08/2018   Diabetes mellitus without complication (HCC)    Diabetic ulcer of toe (HCC) 12/30/2020   Dyslipidemia 12/30/2020   Encounter for screening for diabetes mellitus 03/19/2019   IMOUPDATE   Essential hypertension    GERD without esophagitis 12/30/2020   Hx of CABG 08/23/2014   (Dr. Evia Mary) CABG x5: LIMA-LAD, SVG-D1, SVG-RI, SVG-OM 1, SVG-RPL.   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  LM-normal.  LAD: Proximal 80%, distal 80%; LCx: Proximal 80%, OM1 80%. -Normal EF. -->  Referred for CABG   Obesity (BMI 30-39.9) 05/07/2021   Osteomyelitis of left leg (HCC) 05/05/2021   Osteomyelitis of right foot (HCC) 05/04/2021   Osteomyelitis of toe of right foot (HCC) 12/30/2020   Palpitations with regular cardiac rhythm 12/30/2020   Poorly controlled diabetes mellitus (HCC) 12/30/2020   Progressive angina (HCC)-class III 12/19/2018   Status post cardiac catheterization 03/19/2019   Type 2 diabetes mellitus with diabetic polyneuropathy, with long-term current use of insulin  (HCC) 03/19/2019   Type 2 diabetes mellitus without complication (HCC) 03/19/2019    Current Medications: No outpatient medications have been marked as taking for the 09/13/23 encounter (Appointment) with Monetta Redell PARAS, MD.      EKGs/Labs/Other Studies Reviewed:    The following studies were reviewed today:  Cardiac Studies & Procedures   ______________________________________________________________________________________________ CARDIAC CATHETERIZATION  CARDIAC CATHETERIZATION 02/09/2019  Conclusion  -------------NATIVE CORONARY ANGIOGRAPHY------------------  Mid LM to Ost LAD lesion is 90% stenosed with 99% stenosed side branch in Ost Cx to Prox Cx.  Ramus lesion is 95% stenosed.  Ost LAD to Prox LAD lesion is 99% stenosed. Prox LAD to Mid LAD lesion is 100% stenosed.  Ost RCA to Prox RCA lesion is 40% stenosed. Prox RCA to Dist RCA lesion is 100% stenosed with 100% stenosed side branch in RPAV.  -------------GRAFT ANGIOGRAPHY------------------  SVG-RPL graft was not visualized due to occlusion and inability to cannulate. Origin to Prox Graft lesion is 100% stenosed.  SVG-Ramus graft was not visualized due to occlusion. Origin to Prox Graft lesion is 100% stenosed.  SVG-Diag graft was visualized by angiography and is small. The graft exhibits mild disease.  SVG-OM graft was visualized  by angiography and is large. The graft exhibits no disease.  LIMA graft was visualized by angiography and is large. The graft exhibits no disease. (brisk L-R collaterals fill the RPDA with less prominent filling of the RPA V)  There is mild to moderate left ventricular systolic dysfunction.  LV end diastolic pressure is mildly elevated.  The left ventricular ejection fraction is 45-50% by visual estimate.  There is no aortic valve stenosis.  SUMMARY  Severe native coronary artery disease with essentially subtotal occlusion of the Left Main-LAD, Ramus Intermedius (RI) and LCx trifurcation with severe disease in the proximal part of the RI  2 of 5 grafts occluded (unable to visualize via direct cannulation or root angiogram) with patent LIMA-LAD, SVG-diagonal and SVG-OM (however difficult to tell if this is the OM or ramus that is grafted)  EF does appear to be mildly reduced of roughly 45% with lateral hypokinesis.  No obvious PCI target.  Likely culprit for ischemia with occluded graft to the RI   RECOMMENDATIONS  Discharge home after bedrest  Optimize medical management. ->  Consider antianginal such as ranolazine, amlodipine and Imdur.  Could also potentially titrate up beta-blocker.  Aggressive risk factor modification.    Alm Clay, MD  Findings Coronary Findings Diagnostic  Dominance: Right  Left Main Mid LM to Ost LAD lesion is 90% stenosed with 99% stenosed side branch in Ost Cx to Prox Cx. The lesion is located at the bifurcation. The lesion is severely calcified.  Left Anterior Descending Ost LAD to Prox LAD lesion is 99% stenosed. The lesion is severely calcified. Prox LAD to Mid LAD lesion is 100% stenosed. The lesion is chronically occluded.  First Diagonal Branch Vessel is small in size.  Second Diagonal Branch Vessel is small in size.  Third Diagonal Branch Vessel is small in size.  Third Septal Branch Vessel is small in size.  Left Anterior  Descending Ost LAD to Prox LAD lesion is 99% stenosed. The lesion is severely calcified. Prox LAD to Mid LAD lesion is 100% stenosed. The lesion is chronically occluded.  First Diagonal Branch Vessel is small in size.  Second Diagonal Branch Vessel is small in size.  Third Diagonal Branch Vessel is small in size.  Third Septal Branch Vessel is small in size.  Ramus Intermedius There is moderate diffuse disease throughout the vessel. Ramus lesion is 95% stenosed. The lesion is severely calcified.  Left Circumflex Vessel is small. There is severe diffuse disease throughout the vessel.  First Obtuse Marginal Branch  Second Obtuse Marginal Branch Vessel is small in size.  Right Coronary Artery Ost RCA to Prox RCA lesion is 40% stenosed. The lesion is calcified. Prox RCA to Dist RCA lesion is 100% stenosed with 100% stenosed side branch in RPAV.  Acute Marginal Branch Vessel is small in size.  Right Ventricular Branch Vessel is small in size.  Right Posterior Descending Artery Collaterals RPDA filled by collaterals from 3rd Sept.  Collaterals RPDA filled by collaterals from Dist LAD.  Right Posterior Atrioventricular Artery Vessel is small in size. There is moderate disease in the vessel.  First Right Posterolateral Branch There is moderate disease in the vessel. Collaterals 1st RPL filled by collaterals from 3rd Diag.  Saphenous Graft To RPAV SVG graft was not visualized due to known occlusion and inability to cannulate. SVG-RPL Origin to Prox Graft lesion is 100% stenosed. The lesion is chronically occluded.  Saphenous Graft To Ramus SVG graft was not visualized due to known occlusion. SVG-RI - injected, flush occluded Origin to Prox Graft lesion is 100% stenosed. The lesion is chronically occluded.  Saphenous Graft To 1st Diag SVG graft was visualized by angiography and is small. SVG-Diag The graft exhibits mild .  Saphenous Graft To 1st Mrg SVG graft  was visualized by angiography and is large. SVG-OM  LIMA LIMA Graft To Mid LAD LIMA graft was visualized by angiography and is large. The graft exhibits no disease.  Intervention  No interventions have been documented.   STRESS TESTS  MYOCARDIAL PERFUSION IMAGING 01/28/2019  Interpretation Summary  Nuclear stress EF: 54%.  The left ventricular ejection fraction is mildly decreased (45-54%).  There was no ST segment deviation noted during stress.  Defect 1: There is a medium defect of moderate severity present in the basal inferolateral and mid inferolateral location.  Findings consistent with ischemia.  This is an intermediate risk study.   ECHOCARDIOGRAM  ECHOCARDIOGRAM COMPLETE 08/06/2022  Narrative ECHOCARDIOGRAM REPORT    Patient Name:   Robert Santiago Date of Exam: 08/06/2022 Medical Rec #:  [id number]       Height:       73.0 in Accession #:    [id number]      Weight:       286.0 lb Date of Birth:  01/27/1950       BSA:          2.505 m Patient Age:    72 years        BP:  110/68 mmHg Patient Gender: M               HR:           64 bpm. Exam Location:  [facility]  Procedure: 2D Echo, Cardiac Doppler, Color Doppler and Strain Analysis  Indications:    Coronary artery disease of native artery of native heart with stable angina pectoris (HCC) [I25.118 (ICD-10-CM)]; Essential hypertension [I10 (ICD-10-CM)]; Elevated lipoprotein(a) [E78.41 (ICD-10-CM)]; Type 2 diabetes mellitus without complication, without long-term current use of insulin  (HCC) [E11.9 (ICD-10-CM)]  History:        Patient has no prior history of Echocardiogram examinations. Signs/Symptoms:Chest Pain.  Sonographer:    Lynwood Silvas RDCS Referring Phys: [id number] REDELL JINNY LEITER   Sonographer Comments: Technically challenging study due to limited acoustic windows. IMPRESSIONS   1. Left ventricular ejection fraction, by estimation, is 60 to 65%. The left ventricle has normal function. The  left ventricle has no regional wall motion abnormalities. There is mild concentric left ventricular hypertrophy. Left ventricular diastolic parameters are consistent with Grade II diastolic dysfunction (pseudonormalization). Elevated left atrial pressure. 2. Right ventricular systolic function is normal. The right ventricular size is normal. Tricuspid regurgitation signal is inadequate for assessing PA pressure. 3. The mitral valve is normal in structure. No evidence of mitral valve regurgitation. No evidence of mitral stenosis. 4. The aortic valve is tricuspid. Aortic valve regurgitation is not visualized. No aortic stenosis is present. 5. Aortic Normal DTA. 6. The inferior vena cava is normal in size with greater than 50% respiratory variability, suggesting right atrial pressure of 3 mmHg.  FINDINGS Left Ventricle: Left ventricular ejection fraction, by estimation, is 60 to 65%. The left ventricle has normal function. The left ventricle has no regional wall motion abnormalities. Definity  contrast agent was given IV to delineate the left ventricular endocardial borders. The left ventricular internal cavity size was normal in size. There is mild concentric left ventricular hypertrophy. Left ventricular diastolic parameters are consistent with Grade II diastolic dysfunction (pseudonormalization). Elevated left atrial pressure.   LV Wall Scoring: The mid inferior segment and basal inferoseptal segment are hypokinetic. The entire anterior wall, entire lateral wall, entire anterior septum, entire apex, mid inferoseptal segment, and basal inferior segment are normal.  Right Ventricle: The right ventricular size is normal. No increase in right ventricular wall thickness. Right ventricular systolic function is normal. Tricuspid regurgitation signal is inadequate for assessing PA pressure. The tricuspid regurgitant velocity is 1.37 m/s, and with an assumed right atrial pressure of 3 mmHg, the estimated  right ventricular systolic pressure is 10.5 mmHg.  Left Atrium: Left atrial size was normal in size.  Right Atrium: Right atrial size was normal in size.  Pericardium: There is no evidence of pericardial effusion.  Mitral Valve: The mitral valve is normal in structure. No evidence of mitral valve regurgitation. No evidence of mitral valve stenosis.  Tricuspid Valve: The tricuspid valve is normal in structure. Tricuspid valve regurgitation is trivial. No evidence of tricuspid stenosis.  Aortic Valve: The aortic valve is tricuspid. Aortic valve regurgitation is not visualized. No aortic stenosis is present.  Pulmonic Valve: The pulmonic valve was normal in structure. Pulmonic valve regurgitation is not visualized. No evidence of pulmonic stenosis.  Aorta: The aortic root and ascending aorta are structurally normal, with no evidence of dilitation, the aortic arch was not well visualized and Normal DTA.  Venous: A systolic blunting flow pattern is recorded from the right upper pulmonary vein. The inferior vena cava is normal  in size with greater than 50% respiratory variability, suggesting right atrial pressure of 3 mmHg.  IAS/Shunts: No atrial level shunt detected by color flow Doppler.   LEFT VENTRICLE PLAX 2D LVIDd:         5.50 cm   Diastology LVIDs:         3.70 cm   LV e' medial:    8.27 cm/s LV PW:         1.30 cm   LV E/e' medial:  13.4 LV IVS:        1.40 cm   LV e' lateral:   12.60 cm/s LVOT diam:     2.00 cm   LV E/e' lateral: 8.8 LV SV:         62 LV SV Index:   25 LVOT Area:     3.14 cm   RIGHT VENTRICLE            IVC RV S prime:     8.05 cm/s  IVC diam: 1.40 cm TAPSE (M-mode): 1.8 cm  LEFT ATRIUM             Index        RIGHT ATRIUM           Index LA diam:        3.70 cm 1.48 cm/m   RA Area:     15.20 cm LA Vol (A2C):   91.5 ml 36.53 ml/m  RA Volume:   37.90 ml  15.13 ml/m LA Vol (A4C):   74.3 ml 29.66 ml/m LA Biplane Vol: 82.3 ml 32.86 ml/m AORTIC  VALVE LVOT Vmax:   86.45 cm/s LVOT Vmean:  58.150 cm/s LVOT VTI:    0.198 m  AORTA Ao Root diam: 3.90 cm Ao Asc diam:  3.60 cm Ao Desc diam: 2.50 cm  MV E velocity: 111.00 cm/s  TRICUSPID VALVE MV A velocity: 100.00 cm/s  TR Peak grad:   7.5 mmHg MV E/A ratio:  1.11         TR Vmax:        137.00 cm/s  SHUNTS Systemic VTI:  0.20 m Systemic Diam: 2.00 cm  Redell Leiter MD Electronically signed by Redell Leiter MD Signature Date/Time: 08/06/2022/4:48:03 PM    Final          ______________________________________________________________________________________________          Recent Labs: No results found for requested labs within last 365 days.  Recent Lipid Panel    Component Value Date/Time   CHOL 141 08/15/2021 1324   TRIG 142 08/15/2021 1324   HDL 47 08/15/2021 1324   CHOLHDL 3.0 08/15/2021 1324   LDLCALC 69 08/15/2021 1324    Physical Exam:    VS:  There were no vitals taken for this visit.    Wt Readings from Last 3 Encounters:  07/12/22 286 lb (129.7 kg)  08/15/21 265 lb 9.6 oz (120.5 kg)  05/04/21 280 lb (127 kg)     GEN: *** Well nourished, well developed in no acute distress HEENT: Normal NECK: No JVD; No carotid bruits LYMPHATICS: No lymphadenopathy CARDIAC: ***RRR, no murmurs, rubs, gallops RESPIRATORY:  Clear to auscultation without rales, wheezing or rhonchi  ABDOMEN: Soft, non-tender, non-distended MUSCULOSKELETAL:  No edema; No deformity  SKIN: Warm and dry NEUROLOGIC:  Alert and oriented x 3 PSYCHIATRIC:  Normal affect    Signed, Redell Leiter, MD  09/08/2023 11:17 AM    [facility]

## 2023-09-12 ENCOUNTER — Other Ambulatory Visit: Payer: Self-pay

## 2023-09-13 ENCOUNTER — Ambulatory Visit: Attending: Cardiology | Admitting: Cardiology

## 2023-09-13 DIAGNOSIS — I25118 Atherosclerotic heart disease of native coronary artery with other forms of angina pectoris: Secondary | ICD-10-CM

## 2023-09-13 DIAGNOSIS — E1142 Type 2 diabetes mellitus with diabetic polyneuropathy: Secondary | ICD-10-CM

## 2023-09-13 DIAGNOSIS — E7841 Elevated Lipoprotein(a): Secondary | ICD-10-CM

## 2023-09-13 DIAGNOSIS — I10 Essential (primary) hypertension: Secondary | ICD-10-CM

## 2023-09-23 ENCOUNTER — Other Ambulatory Visit: Payer: Self-pay | Admitting: Cardiology

## 2023-10-11 ENCOUNTER — Other Ambulatory Visit: Payer: Self-pay | Admitting: Cardiology

## 2023-10-21 NOTE — Progress Notes (Unsigned)
 Cardiology Office Note:    Date:  10/22/2023   ID:  Robert Santiago, DOB [birth year], MRN [id number]  PCP:  Montey Lot, PA-C  Cardiologist:  Redell Leiter, MD    Referring MD: Montey Lot, PA-C    ASSESSMENT:    1. Coronary artery disease of native artery of native heart with stable angina pectoris   2. Essential hypertension   3. Elevated lipoprotein(a)   4. Type 2 diabetes mellitus without complication, without long-term current use of insulin  (HCC)    PLAN:    In order of problems listed above:  His CAD is stable fortunately having no anginal dyspnea effort continue his current medical regimen including aspirin  clopidogrel  lipid-lowering at this high intensity statin and Zetia  along with his beta-blocker and as needed nitroglycerin  Hypertension controlled continue his current treatment including ACE inhibitor Continue his current lipid-lowering He has decided not to undergo evaluation for claudication needs that he contact me if the symptoms were disruptive   Next appointment: 1 year   Medication Adjustments/Labs and Tests Ordered: Current medicines are reviewed at length with the patient today.  Concerns regarding medicines are outlined above.  Orders Placed This Encounter  Procedures   EKG 12-Lead   No orders of the defined types were placed in this encounter.    History of Present Illness:    Robert Santiago is a 73 y.o. male with a hx of CAD hypertension hyperlipidemia type 2 diabetes LV dysfunction elevated lipoprotein little a last seen 07/12/2022.  Following the visit he had an echocardiogram 08/06/2022 showing LV EF normal 60 to 65% with mild concentric LVH normal right ventricular size and function.  Compliance with diet, lifestyle and medications: Yes  Is retired now less active but also tells me that he no longer has the strength and endurance he had before he is limited with difficulty of balance he attributes that to his previous acoustic neuroma as  well as loss of toes on his right foot but is also having some pain in the left buttock with walking that sounds like claudication. I advised him to undergo ABIs he declines he said he would let me know if it was more severe Over all he is pleased with the quality of his life he has not needed nitroglycerin  He he previously wants on CPAP but he was intolerant  Past Medical History:  Diagnosis Date   Acquired hypothyroidism 03/19/2019   Acute coronary syndrome (HCC) 12/30/2020   Anxiety 12/30/2020   Atherosclerosis of autologous vein coronary artery bypass graft(s) with unstable angina pectoris (HCC) 03/19/2019   BPH (benign prostatic hyperplasia) 05/04/2021   Cellulitis of foot 12/30/2020   Cellulitis of right leg 05/04/2021   Chest pain 12/30/2020   Closed fracture of cuneiform bone of foot 12/30/2020   Coronary artery disease involving native coronary artery of native heart with angina pectoris 03/19/2019   Cath 08/20/14: Conclusions Diagnostic Procedure Summary 1. Severe, diffuse multi-vessel CAD 2. Normal LV function Diagnostic Procedure Recommendations CTS consult Angiographic findings Cardiac Arteries and Lesion Findings LMCA: Normal. LAD: Lesion on Prox LAD: 80% stenosis. Lesion on Dist LAD: 80% stenosis. LCx: Lesion on Prox CX: Proximal subsection.80% stenosis. Lesion on 1st Ob Marg: 80% stenos   Coronary artery disease involving native coronary artery of native heart without angina pectoris 03/19/2019   Cath 08/20/14: Conclusions Diagnostic Procedure Summary 1. Severe, diffuse multi-vessel CAD 2. Normal LV function Diagnostic Procedure Recommendations CTS consult Angiographic findings Cardiac Arteries and Lesion Findings LMCA: Normal. LAD: Lesion  on Prox LAD: 80% stenosis. Lesion on Dist LAD: 80% stenosis. LCx: Lesion on Prox CX: Proximal subsection.80% stenosis. Lesion on 1st Ob Marg: 80% stenos   Coronary artery disease of native artery of native heart with stable angina pectoris    COVID-19  08/2018   Diabetes mellitus without complication (HCC)    Diabetic ulcer of toe (HCC) 12/30/2020   Dyslipidemia 12/30/2020   Encounter for screening for diabetes mellitus 03/19/2019   IMOUPDATE   Essential hypertension    GERD without esophagitis 12/30/2020   Hx of CABG 08/23/2014   (Dr. Evia Mary) CABG x5: LIMA-LAD, SVG-D1, SVG-RI, SVG-OM 1, SVG-RPL.   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  LM-normal.  LAD: Proximal 80%, distal 80%; LCx: Proximal 80%, OM1 80%. -Normal EF. -->  Referred for CABG   Obesity (BMI 30-39.9) 05/07/2021   Osteomyelitis of left leg (HCC) 05/05/2021   Osteomyelitis of right foot (HCC) 05/04/2021   Osteomyelitis of toe of right foot (HCC) 12/30/2020   Palpitations with regular cardiac rhythm 12/30/2020   Poorly controlled diabetes mellitus (HCC) 12/30/2020   Progressive angina (HCC)-class III 12/19/2018   Status post cardiac catheterization 03/19/2019   Type 2 diabetes mellitus with diabetic polyneuropathy, with long-term current use of insulin  (HCC) 03/19/2019   Type 2 diabetes mellitus without complication 03/19/2019    Current Medications: Current Meds  Medication Sig   ACCU-CHEK GUIDE TEST test strip 1 each.   ALPRAZolam (XANAX) 0.5 MG tablet Take 0.5 mg by mouth daily as needed for anxiety (panic attacks).   aspirin  EC 81 MG tablet Take 81 mg by mouth at bedtime.   benazepril  (LOTENSIN ) 10 MG tablet TAKE 1 TABLET BY MOUTH EVERY DAY   busPIRone  (BUSPAR ) 10 MG tablet Take 10 mg by mouth 2 (two) times daily.   clopidogrel  (PLAVIX ) 75 MG tablet TAKE 1 TABLET BY MOUTH EVERY DAY   DULoxetine  (CYMBALTA ) 60 MG capsule Take 120 mg by mouth at bedtime.   EMBECTA PEN NEEDLE ULTRAFINE 31G X 5 MM MISC SMARTSIG:1  injection   ezetimibe  (ZETIA ) 10 MG tablet Take 10 mg by mouth at bedtime.   insulin  aspart (NOVOLOG  FLEXPEN) 100 UNIT/ML FlexPen Inject 10 Units into the skin every evening.   LANTUS  SOLOSTAR 100 UNIT/ML Solostar Pen Inject 50-70 Units into the skin at bedtime.   levothyroxine  (SYNTHROID ) 175 MCG tablet Take 150 mcg by mouth every morning.   metFORMIN (GLUCOPHAGE) 1000 MG tablet Take 1,000 mg by mouth 2 (two) times daily.   metoprolol  tartrate (LOPRESSOR ) 25 MG tablet Take 25 mg by mouth 2 (two) times daily.    nitroGLYCERIN  (NITROSTAT ) 0.4 MG SL tablet Place 1 tablet (0.4 mg total) under the tongue every 5 (five) minutes as needed for chest pain.   omeprazole (PRILOSEC) 40 MG capsule Take 40 mg by mouth every morning.   rosuvastatin  (CRESTOR ) 10 MG tablet Take 10 mg by mouth at bedtime.   Semaglutide,0.25 or 0.5MG /DOS, (OZEMPIC, 0.25 OR 0.5 MG/DOSE,) 2 MG/1.5ML SOPN Inject 0.25 mg into the skin once a week.   tamsulosin  (FLOMAX ) 0.4 MG CAPS capsule Take 0.4 mg by mouth at bedtime.      EKGs/Labs/Other Studies Reviewed:    The following studies were reviewed today:     EKG Interpretation Date/Time:  Tuesday October 22 2023 13:50:38 EDT Ventricular Rate:  69 PR Interval:  192 QRS Duration:  124 QT Interval:  406 QTC Calculation: 435 R Axis:   94  Text Interpretation: Normal sinus rhythm Right bundle branch block T wave abnormality, consider inferolateral ischemia No previous ECGs available Confirmed by Monetta Rogue ([id number]) on 10/22/2023 2:39:44 PM  EKG Interpretation Date/Time:  Tuesday October 22 2023 13:50:38 EDT Ventricular Rate:  69 PR Interval:  192 QRS Duration:  124 QT Interval:  406 QTC Calculation: 435 R Axis:   94  Text Interpretation: Normal sinus rhythm Right bundle branch block T wave abnormality, consider inferolateral ischemia No previous ECGs available Confirmed by Monetta Rogue ([id number]) on 10/22/2023 2:39:44 PM    Recent Labs: No results found for requested  labs within last 365 days.  Recent Lipid Panel    Component Value Date/Time   CHOL 141 08/15/2021 1324   TRIG 142 08/15/2021 1324   HDL 47 08/15/2021 1324   CHOLHDL 3.0 08/15/2021 1324   LDLCALC 69 08/15/2021 1324    Physical Exam:    VS:  BP (!) 132/58   Pulse 69   Ht 6' (1.829 m)   Wt 285 lb 9.6 oz (129.5 kg)   SpO2 99%   BMI 38.73 kg/m     Wt Readings from Last 3 Encounters:  10/22/23 285 lb 9.6 oz (129.5 kg)  07/12/22 286 lb (129.7 kg)  08/15/21 265 lb 9.6 oz (120.5 kg)     GEN:  Well nourished, well developed in no acute distress HEENT: Normal NECK: No JVD; No carotid bruits LYMPHATICS: No lymphadenopathy CARDIAC: RRR, no murmurs, rubs, gallops RESPIRATORY:  Clear to auscultation without rales, wheezing or rhonchi  ABDOMEN: Soft, non-tender, non-distended MUSCULOSKELETAL:  No edema; No deformity  SKIN: Warm and dry NEUROLOGIC:  Alert and oriented x 3 PSYCHIATRIC:  Normal affect    Signed, Rogue Monetta, MD  10/22/2023 2:39 PM    [facility]

## 2023-10-22 ENCOUNTER — Encounter: Payer: Self-pay | Admitting: Cardiology

## 2023-10-22 ENCOUNTER — Ambulatory Visit: Attending: Cardiology | Admitting: Cardiology

## 2023-10-22 VITALS — BP 132/58 | HR 69 | Ht 72.0 in | Wt 285.6 lb

## 2023-10-22 DIAGNOSIS — I25118 Atherosclerotic heart disease of native coronary artery with other forms of angina pectoris: Secondary | ICD-10-CM

## 2023-10-22 DIAGNOSIS — E7841 Elevated Lipoprotein(a): Secondary | ICD-10-CM | POA: Diagnosis not present

## 2023-10-22 DIAGNOSIS — I10 Essential (primary) hypertension: Secondary | ICD-10-CM

## 2023-10-22 DIAGNOSIS — E119 Type 2 diabetes mellitus without complications: Secondary | ICD-10-CM

## 2023-10-22 NOTE — Patient Instructions (Signed)

## 2023-10-24 DIAGNOSIS — J309 Allergic rhinitis, unspecified: Secondary | ICD-10-CM | POA: Diagnosis not present

## 2023-10-24 DIAGNOSIS — R42 Dizziness and giddiness: Secondary | ICD-10-CM | POA: Diagnosis not present

## 2023-10-24 DIAGNOSIS — Z1331 Encounter for screening for depression: Secondary | ICD-10-CM | POA: Diagnosis not present

## 2023-10-27 ENCOUNTER — Other Ambulatory Visit: Payer: Self-pay | Admitting: Cardiology

## 2023-11-08 ENCOUNTER — Other Ambulatory Visit: Payer: Self-pay | Admitting: Cardiology

## 2023-12-23 DIAGNOSIS — H524 Presbyopia: Secondary | ICD-10-CM | POA: Diagnosis not present

## 2023-12-23 DIAGNOSIS — E119 Type 2 diabetes mellitus without complications: Secondary | ICD-10-CM | POA: Diagnosis not present

## 2024-01-22 ENCOUNTER — Telehealth: Payer: Self-pay | Admitting: Cardiology

## 2024-01-22 NOTE — Telephone Encounter (Signed)
 Pt c/o Shortness Of Breath: STAT if SOB developed within the last 24 hours or pt is noticeably SOB on the phone  1. Are you currently SOB (can you hear that pt is SOB on the phone)?   No  2. How long have you been experiencing SOB?  Yesterday  3. Are you SOB when sitting or when up moving around?   When up and moving around  4. Are you currently experiencing any other symptoms?  Sometimes headaches  Wife Delsie) stated patient has been having more SOB episodes.

## 2024-01-23 ENCOUNTER — Other Ambulatory Visit: Payer: Self-pay

## 2024-01-23 NOTE — Progress Notes (Deleted)
 ----------- Cardiology Office Note:    Date:  01/24/2024   ID:  Robert Santiago, DOB [birth year], MRN [id number]  PCP:  Montey Lot, PA-C  Cardiologist:  Redell Leiter, MD    Referring MD: Montey Lot, PA-C    ASSESSMENT:    1. Coronary artery disease of native artery of native heart with stable angina pectoris   2. Essential hypertension   3. Elevated lipoprotein(a)    PLAN:    In order of problems listed above:  ***   Next appointment: ***   Medication Adjustments/Labs and Tests Ordered: Current medicines are reviewed at length with the patient today.  Concerns regarding medicines are outlined above.  Orders Placed This Encounter  Procedures   EKG 12-Lead   No orders of the defined types were placed in this encounter.    History of Present Illness:    Robert Santiago is a 74 y.o. male with a hx of CAD with CABG August 2016 hypertension hyperlipidemia type 2 diabetes LV dysfunction elevated LP(a) last seen 10/22/2023.  He phoned the office yesterday concerned about exertional shortness of breath and requested to be seen.  He had angiography performed 02/08/18 and 2 of his 5 grafts to be occluded with patent left thoracic artery to the LAD and a vein graft to the diagonal and marginal branch. The ejection fraction of the range of 45% with lateral hypokinesia he had severe native coronary artery disease with subtotal occlusion of left main left anterior descending ramus and left circumflex trifurcation. He was felt to be best treated medically.  His most recent ejection fraction July 2000 2460 to 65% with mild LVH and elevated filling pressures.  Compliance with diet, lifestyle and medications: *** Past Medical History:  Diagnosis Date   Acquired hypothyroidism 03/19/2019   Acute coronary syndrome (HCC) 12/30/2020   Anxiety 12/30/2020   Atherosclerosis of autologous vein coronary artery bypass graft(s) with unstable angina pectoris (HCC) 03/19/2019   BPH (benign prostatic  hyperplasia) 05/04/2021   Cellulitis of foot 12/30/2020   Cellulitis of right leg 05/04/2021   Chest pain 12/30/2020   Closed fracture of cuneiform bone of foot 12/30/2020   Coronary artery disease involving native coronary artery of native heart with angina pectoris 03/19/2019   Cath 08/20/14: Conclusions Diagnostic Procedure Summary 1. Severe, diffuse multi-vessel CAD 2. Normal LV function Diagnostic Procedure Recommendations CTS consult Angiographic findings Cardiac Arteries and Lesion Findings LMCA: Normal. LAD: Lesion on Prox LAD: 80% stenosis. Lesion on Dist LAD: 80% stenosis. LCx: Lesion on Prox CX: Proximal subsection.80% stenosis. Lesion on 1st Ob Marg: 80% stenos   Coronary artery disease involving native coronary artery of native heart without angina pectoris 03/19/2019   Cath 08/20/14: Conclusions Diagnostic Procedure Summary 1. Severe, diffuse multi-vessel CAD 2. Normal LV function Diagnostic Procedure Recommendations CTS consult Angiographic findings Cardiac Arteries and Lesion Findings LMCA: Normal. LAD: Lesion on Prox LAD: 80% stenosis. Lesion on Dist LAD: 80% stenosis. LCx: Lesion on Prox CX: Proximal subsection.80% stenosis. Lesion on 1st Ob Marg: 80% stenos   Coronary artery disease of native artery of native heart with stable angina pectoris    COVID-19 08/2018   Diabetes mellitus without complication (HCC)    Diabetic ulcer of toe (HCC) 12/30/2020   Dyslipidemia 12/30/2020   Encounter for screening for diabetes mellitus 03/19/2019   IMOUPDATE   Essential hypertension    GERD without esophagitis 12/30/2020   Hx of CABG 08/23/2014   (Dr. Evia Mary) CABG x5: LIMA-LAD, SVG-D1, SVG-RI, SVG-OM 1,  SVG-RPL.   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  LM-normal.  LAD: Proximal 80%, distal 80%; LCx: Proximal 80%, OM1 80%. -Normal EF. -->  Referred for CABG   Obesity (BMI 30-39.9) 05/07/2021   Osteomyelitis of left leg (HCC) 05/05/2021   Osteomyelitis of right foot (HCC) 05/04/2021   Osteomyelitis of toe of right foot (HCC) 12/30/2020   Palpitations with regular cardiac rhythm 12/30/2020   Poorly controlled diabetes mellitus (HCC) 12/30/2020   Progressive angina (HCC)-class III 12/19/2018   Status post cardiac catheterization 03/19/2019   Type 2 diabetes mellitus with diabetic polyneuropathy, with long-term current use of insulin  (HCC) 03/19/2019   Type 2 diabetes mellitus without complication 03/19/2019    Current Medications: Active Medications[1]    EKGs/Labs/Other Studies Reviewed:    The following studies were reviewed today:  Cardiac Studies & Procedures   ______________________________________________________________________________________________ CARDIAC CATHETERIZATION  CARDIAC CATHETERIZATION 02/09/2019  Conclusion  -------------NATIVE CORONARY ANGIOGRAPHY------------------  Mid LM to Ost LAD lesion is 90% stenosed with 99% stenosed side branch in Ost Cx to Prox Cx.  Ramus lesion is 95% stenosed.  Ost LAD to Prox LAD lesion is 99% stenosed. Prox LAD to Mid LAD lesion is 100% stenosed.  Ost RCA to Prox RCA lesion is 40% stenosed. Prox RCA to Dist RCA lesion is 100% stenosed with 100% stenosed side branch in RPAV.  -------------GRAFT ANGIOGRAPHY------------------  SVG-RPL graft was not visualized due to occlusion and inability to cannulate. Origin to Prox Graft lesion is 100% stenosed.  SVG-Ramus graft was not visualized due to occlusion. Origin to Prox Graft lesion is 100% stenosed.  SVG-Diag graft was visualized by angiography and is small. The graft exhibits mild disease.  SVG-OM graft was visualized by angiography and is large. The graft exhibits no disease.  LIMA graft was visualized by  angiography and is large. The graft exhibits no disease. (brisk L-R collaterals fill the RPDA with less prominent filling of the RPA V)  There is mild to moderate left ventricular systolic dysfunction.  LV end diastolic pressure is mildly elevated.  The left ventricular ejection fraction is 45-50% by visual estimate.  There is no aortic valve stenosis.  SUMMARY  Severe native coronary artery disease with essentially subtotal occlusion of the Left Main-LAD, Ramus Intermedius (RI) and LCx trifurcation with severe disease in the proximal part of the RI  2 of 5 grafts occluded (unable to visualize via direct cannulation or root angiogram) with patent LIMA-LAD, SVG-diagonal and SVG-OM (however difficult to tell if this is the OM or ramus that is grafted)  EF does appear to be mildly reduced of roughly 45% with lateral hypokinesis.  No obvious PCI target.  Likely culprit for ischemia with occluded graft to the RI   RECOMMENDATIONS  Discharge home after bedrest  Optimize medical management. ->  Consider antianginal such as ranolazine, amlodipine and Imdur.  Could also potentially titrate up beta-blocker.  Aggressive risk factor modification.    Alm Clay, MD  Findings Coronary Findings Diagnostic  Dominance: Right  Left Main Mid LM to Ost LAD lesion is 90% stenosed with 99% stenosed side branch in Ost Cx to Prox Cx. The lesion is located at the bifurcation. The lesion is severely calcified.  Left Anterior Descending Ost LAD to Prox LAD lesion is  99% stenosed. The lesion is severely calcified. Prox LAD to Mid LAD lesion is 100% stenosed. The lesion is chronically occluded.  First Diagonal Branch Vessel is small in size.  Second Diagonal Branch Vessel is small in size.  Third Diagonal Branch Vessel is small in size.  Third Septal Branch Vessel is small in size.  Left Anterior Descending Ost LAD to Prox LAD lesion is 99% stenosed. The lesion is severely  calcified. Prox LAD to Mid LAD lesion is 100% stenosed. The lesion is chronically occluded.  First Diagonal Branch Vessel is small in size.  Second Diagonal Branch Vessel is small in size.  Third Diagonal Branch Vessel is small in size.  Third Septal Branch Vessel is small in size.  Ramus Intermedius There is moderate diffuse disease throughout the vessel. Ramus lesion is 95% stenosed. The lesion is severely calcified.  Left Circumflex Vessel is small. There is severe diffuse disease throughout the vessel.  First Obtuse Marginal Branch  Second Obtuse Marginal Branch Vessel is small in size.  Right Coronary Artery Ost RCA to Prox RCA lesion is 40% stenosed. The lesion is calcified. Prox RCA to Dist RCA lesion is 100% stenosed with 100% stenosed side branch in RPAV.  Acute Marginal Branch Vessel is small in size.  Right Ventricular Branch Vessel is small in size.  Right Posterior Descending Artery Collaterals RPDA filled by collaterals from 3rd Sept.  Collaterals RPDA filled by collaterals from Dist LAD.  Right Posterior Atrioventricular Artery Vessel is small in size. There is moderate disease in the vessel.  First Right Posterolateral Branch There is moderate disease in the vessel. Collaterals 1st RPL filled by collaterals from 3rd Diag.  Saphenous Graft To RPAV SVG graft was not visualized due to known occlusion and inability to cannulate. SVG-RPL Origin to Prox Graft lesion is 100% stenosed. The lesion is chronically occluded.  Saphenous Graft To Ramus SVG graft was not visualized due to known occlusion. SVG-RI - injected, flush occluded Origin to Prox Graft lesion is 100% stenosed. The lesion is chronically occluded.  Saphenous Graft To 1st Diag SVG graft was visualized by angiography and is small. SVG-Diag The graft exhibits mild .  Saphenous Graft To 1st Mrg SVG graft was visualized by angiography and is large. SVG-OM  LIMA LIMA Graft To Mid  LAD LIMA graft was visualized by angiography and is large. The graft exhibits no disease.  Intervention  No interventions have been documented.   STRESS TESTS  MYOCARDIAL PERFUSION IMAGING 01/28/2019  Interpretation Summary  Nuclear stress EF: 54%.  The left ventricular ejection fraction is mildly decreased (45-54%).  There was no ST segment deviation noted during stress.  Defect 1: There is a medium defect of moderate severity present in the basal inferolateral and mid inferolateral location.  Findings consistent with ischemia.  This is an intermediate risk study.   ECHOCARDIOGRAM  ECHOCARDIOGRAM COMPLETE 08/06/2022  Narrative ECHOCARDIOGRAM REPORT    Patient Name:   Robert Santiago Date of Exam: 08/06/2022 Medical Rec #:  [id number]       Height:       73.0 in Accession #:    [id number]      Weight:       286.0 lb Date of Birth:  01-21-1950       BSA:          2.505 m Patient Age:    74 years        BP:  110/68 mmHg Patient Gender: M               HR:           64 bpm. Exam Location:  [facility]  Procedure: 2D Echo, Cardiac Doppler, Color Doppler and Strain Analysis  Indications:    Coronary artery disease of native artery of native heart with stable angina pectoris (HCC) [I25.118 (ICD-10-CM)]; Essential hypertension [I10 (ICD-10-CM)]; Elevated lipoprotein(a) [E78.41 (ICD-10-CM)]; Type 2 diabetes mellitus without complication, without long-term current use of insulin  (HCC) [E11.9 (ICD-10-CM)]  History:        Patient has no prior history of Echocardiogram examinations. Signs/Symptoms:Chest Pain.  Sonographer:    Lynwood Silvas RDCS Referring Phys: [id number] REDELL JINNY LEITER   Sonographer Comments: Technically challenging study due to limited acoustic windows. IMPRESSIONS   1. Left ventricular ejection fraction, by estimation, is 60 to 65%. The left ventricle has normal function. The left ventricle has no regional wall motion abnormalities. There is mild  concentric left ventricular hypertrophy. Left ventricular diastolic parameters are consistent with Grade II diastolic dysfunction (pseudonormalization). Elevated left atrial pressure. 2. Right ventricular systolic function is normal. The right ventricular size is normal. Tricuspid regurgitation signal is inadequate for assessing PA pressure. 3. The mitral valve is normal in structure. No evidence of mitral valve regurgitation. No evidence of mitral stenosis. 4. The aortic valve is tricuspid. Aortic valve regurgitation is not visualized. No aortic stenosis is present. 5. Aortic Normal DTA. 6. The inferior vena cava is normal in size with greater than 50% respiratory variability, suggesting right atrial pressure of 3 mmHg.  FINDINGS Left Ventricle: Left ventricular ejection fraction, by estimation, is 60 to 65%. The left ventricle has normal function. The left ventricle has no regional wall motion abnormalities. Definity  contrast agent was given IV to delineate the left ventricular endocardial borders. The left ventricular internal cavity size was normal in size. There is mild concentric left ventricular hypertrophy. Left ventricular diastolic parameters are consistent with Grade II diastolic dysfunction (pseudonormalization). Elevated left atrial pressure.   LV Wall Scoring: The mid inferior segment and basal inferoseptal segment are hypokinetic. The entire anterior wall, entire lateral wall, entire anterior septum, entire apex, mid inferoseptal segment, and basal inferior segment are normal.  Right Ventricle: The right ventricular size is normal. No increase in right ventricular wall thickness. Right ventricular systolic function is normal. Tricuspid regurgitation signal is inadequate for assessing PA pressure. The tricuspid regurgitant velocity is 1.37 m/s, and with an assumed right atrial pressure of 3 mmHg, the estimated right ventricular systolic pressure is 10.5 mmHg.  Left Atrium: Left  atrial size was normal in size.  Right Atrium: Right atrial size was normal in size.  Pericardium: There is no evidence of pericardial effusion.  Mitral Valve: The mitral valve is normal in structure. No evidence of mitral valve regurgitation. No evidence of mitral valve stenosis.  Tricuspid Valve: The tricuspid valve is normal in structure. Tricuspid valve regurgitation is trivial. No evidence of tricuspid stenosis.  Aortic Valve: The aortic valve is tricuspid. Aortic valve regurgitation is not visualized. No aortic stenosis is present.  Pulmonic Valve: The pulmonic valve was normal in structure. Pulmonic valve regurgitation is not visualized. No evidence of pulmonic stenosis.  Aorta: The aortic root and ascending aorta are structurally normal, with no evidence of dilitation, the aortic arch was not well visualized and Normal DTA.  Venous: A systolic blunting flow pattern is recorded from the right upper pulmonary vein. The inferior vena cava is normal  in size with greater than 50% respiratory variability, suggesting right atrial pressure of 3 mmHg.  IAS/Shunts: No atrial level shunt detected by color flow Doppler.   LEFT VENTRICLE PLAX 2D LVIDd:         5.50 cm   Diastology LVIDs:         3.70 cm   LV e' medial:    8.27 cm/s LV PW:         1.30 cm   LV E/e' medial:  13.4 LV IVS:        1.40 cm   LV e' lateral:   12.60 cm/s LVOT diam:     2.00 cm   LV E/e' lateral: 8.8 LV SV:         62 LV SV Index:   25 LVOT Area:     3.14 cm   RIGHT VENTRICLE            IVC RV S prime:     8.05 cm/s  IVC diam: 1.40 cm TAPSE (M-mode): 1.8 cm  LEFT ATRIUM             Index        RIGHT ATRIUM           Index LA diam:        3.70 cm 1.48 cm/m   RA Area:     15.20 cm LA Vol (A2C):   91.5 ml 36.53 ml/m  RA Volume:   37.90 ml  15.13 ml/m LA Vol (A4C):   74.3 ml 29.66 ml/m LA Biplane Vol: 82.3 ml 32.86 ml/m AORTIC VALVE LVOT Vmax:   86.45 cm/s LVOT Vmean:  58.150 cm/s LVOT VTI:     0.198 m  AORTA Ao Root diam: 3.90 cm Ao Asc diam:  3.60 cm Ao Desc diam: 2.50 cm  MV E velocity: 111.00 cm/s  TRICUSPID VALVE MV A velocity: 100.00 cm/s  TR Peak grad:   7.5 mmHg MV E/A ratio:  1.11         TR Vmax:        137.00 cm/s  SHUNTS Systemic VTI:  0.20 m Systemic Diam: 2.00 cm  Redell Leiter MD Electronically signed by Redell Leiter MD Signature Date/Time: 08/06/2022/4:48:03 PM    Final          ______________________________________________________________________________________________      EKG Interpretation Date/Time:  Friday January 24 2024 08:05:16 EST Ventricular Rate:  66 PR Interval:  206 QRS Duration:  158 QT Interval:  438 QTC Calculation: 459 R Axis:   100  Text Interpretation: Sinus rhythm with Premature atrial complexes Right bundle branch block T wave abnormality, consider inferolateral ischemia When compared with ECG of 22-Oct-2023 13:50, No sign change Confirmed by Leiter Redell ([id number]) on 01/24/2024 8:09:53 AM   Recent Labs: No results found for requested labs within last 365 days.  Recent Lipid Panel    Component Value Date/Time   CHOL 141 08/15/2021 1324   TRIG 142 08/15/2021 1324   HDL 47 08/15/2021 1324   CHOLHDL 3.0 08/15/2021 1324   LDLCALC 69 08/15/2021 1324    Physical Exam:    VS:  BP 138/68   Pulse 66   Ht 6' (1.829 m)   Wt 287 lb 12.8 oz (130.5 kg)   SpO2 96%   BMI 39.03 kg/m     Wt Readings from Last 3 Encounters:  01/24/24 287 lb 12.8 oz (130.5 kg)  10/22/23 285 lb 9.6 oz (129.5 kg)  07/12/22 286 lb (129.7 kg)  GEN: *** Well nourished, well developed in no acute distress HEENT: Normal NECK: No JVD; No carotid bruits LYMPHATICS: No lymphadenopathy CARDIAC: ***RRR, no murmurs, rubs, gallops RESPIRATORY:  Clear to auscultation without rales, wheezing or rhonchi  ABDOMEN: Soft, non-tender, non-distended MUSCULOSKELETAL:  No edema; No deformity  SKIN: Warm and dry NEUROLOGIC:  Alert and oriented x  3 PSYCHIATRIC:  Normal affect    Signed, Redell Leiter, MD  01/24/2024 8:11 AM    [facility]      [1]  Current Meds  Medication Sig   ACCU-CHEK GUIDE TEST test strip 1 each.   ALPRAZolam (XANAX) 0.5 MG tablet Take 0.5 mg by mouth daily as needed for anxiety (panic attacks).   aspirin  EC 81 MG tablet Take 81 mg by mouth at bedtime.   benazepril  (LOTENSIN ) 10 MG tablet TAKE 1 TABLET BY MOUTH EVERY DAY   buPROPion (WELLBUTRIN XL) 150 MG 24 hr tablet Take 150 mg by mouth daily.   busPIRone  (BUSPAR ) 10 MG tablet Take 10 mg by mouth 2 (two) times daily.   clopidogrel  (PLAVIX ) 75 MG tablet Take 1 tablet (75 mg total) by mouth daily.   DULoxetine  (CYMBALTA ) 60 MG capsule Take 120 mg by mouth at bedtime.   EMBECTA PEN NEEDLE NANO 2 GEN 32G X 4 MM MISC    EMBECTA PEN NEEDLE ULTRAFINE 31G X 5 MM MISC SMARTSIG:1 injection   ezetimibe  (ZETIA ) 10 MG tablet Take 10 mg by mouth at bedtime.   insulin  aspart (NOVOLOG  FLEXPEN) 100 UNIT/ML FlexPen Inject 10 Units into the skin every evening.   LANTUS  SOLOSTAR 100 UNIT/ML Solostar Pen Inject 50-70 Units into the skin at bedtime.   levothyroxine  (SYNTHROID ) 150 MCG tablet Take 150 mcg by mouth daily.   metFORMIN (GLUCOPHAGE) 1000 MG tablet Take 1,000 mg by mouth 2 (two) times daily.   metoprolol  tartrate (LOPRESSOR ) 25 MG tablet Take 25 mg by mouth 2 (two) times daily.    nitroGLYCERIN  (NITROSTAT ) 0.4 MG SL tablet Place 1 tablet (0.4 mg total) under the tongue every 5 (five) minutes as needed for chest pain.   omeprazole (PRILOSEC) 40 MG capsule Take 40 mg by mouth every morning.   rosuvastatin  (CRESTOR ) 10 MG tablet Take 10 mg by mouth at bedtime.   tamsulosin  (FLOMAX ) 0.4 MG CAPS capsule Take 0.4 mg by mouth at bedtime.   [DISCONTINUED] Semaglutide,0.25 or 0.5MG /DOS, (OZEMPIC, 0.25 OR 0.5 MG/DOSE,) 2 MG/1.5ML SOPN Inject 0.25 mg into the skin once a week.

## 2024-01-24 ENCOUNTER — Encounter: Payer: Self-pay | Admitting: Cardiology

## 2024-01-24 ENCOUNTER — Ambulatory Visit: Attending: Cardiology | Admitting: Cardiology

## 2024-01-24 VITALS — BP 138/68 | HR 66 | Ht 72.0 in | Wt 287.8 lb

## 2024-01-24 DIAGNOSIS — Z79899 Other long term (current) drug therapy: Secondary | ICD-10-CM

## 2024-01-24 DIAGNOSIS — I25118 Atherosclerotic heart disease of native coronary artery with other forms of angina pectoris: Secondary | ICD-10-CM | POA: Diagnosis not present

## 2024-01-24 DIAGNOSIS — E7841 Elevated Lipoprotein(a): Secondary | ICD-10-CM | POA: Diagnosis not present

## 2024-01-24 DIAGNOSIS — I739 Peripheral vascular disease, unspecified: Secondary | ICD-10-CM

## 2024-01-24 DIAGNOSIS — R0602 Shortness of breath: Secondary | ICD-10-CM

## 2024-01-24 DIAGNOSIS — I10 Essential (primary) hypertension: Secondary | ICD-10-CM | POA: Diagnosis not present

## 2024-01-24 MED ORDER — FUROSEMIDE 40 MG PO TABS: 40.0000 mg | ORAL_TABLET | Freq: Every day | ORAL | 3 refills | Status: AC

## 2024-01-24 NOTE — Patient Instructions (Signed)
 Medication Instructions:  Your physician recommends that you continue on your current medications as directed. Please refer to the Current Medication list given to you today.  *If you need a refill on your cardiac medications before your next appointment, please call your pharmacy*   Lab Work: BMP & Pro BNP  Dr. Monetta would like you to return in one week to have your BMP & Pro BNP repeated.  If you have labs (blood work) drawn today and your tests are completely normal, you will receive your results only by: MyChart Message (if you have MyChart) OR A paper copy in the mail If you have any lab test that is abnormal or we need to change your treatment, we will call you to review the results.  Testing/Procedures: Your physician has requested that you have an echocardiogram. Echocardiography is a painless test that uses sound waves to create images of your heart. It provides your doctor with information about the size and shape of your heart and how well your hearts chambers and valves are working. This procedure takes approximately one hour. There are no restrictions for this procedure. Please do NOT wear cologne, perfume, aftershave, or lotions (deodorant is allowed). Please arrive 15 minutes prior to your appointment time.  Please note: We ask at that you not bring children with you during ultrasound (echo/ vascular) testing. Due to room size and safety concerns, children are not allowed in the ultrasound rooms during exams. Our front office staff cannot provide observation of children in our lobby area while testing is being conducted. An adult accompanying a patient to their appointment will only be allowed in the ultrasound room at the discretion of the ultrasound technician under special circumstances. We apologize for any inconvenience.  Follow-Up: At [facility], you and your health needs are our priority.  As part of our continuing mission to provide you with exceptional heart care,  we have created designated Provider Care Teams.  These Care Teams include your primary Cardiologist (physician) and Advanced Practice Providers (APPs -  Physician Assistants and Nurse Practitioners) who all work together to provide you with the care you need, when you need it.  We recommend signing up for the patient portal called MyChart.  Sign up information is provided on this After Visit Summary.  MyChart is used to connect with patients for Virtual Visits (Telemedicine).  Patients are able to view lab/test results, encounter notes, upcoming appointments, etc.  Non-urgent messages can be sent to your provider as well.   To learn more about what you can do with MyChart, go to [email or web address].    Your next appointment:   4 week(s)  The format for your next appointment:   In Person  Provider:   Redell Monetta, MD   Other Instructions Echocardiogram An echocardiogram is a test that uses sound waves (ultrasound) to produce images of the heart. Images from an echocardiogram can provide important information about: Heart size and shape. The size and thickness and movement of your heart's walls. Heart muscle function and strength. Heart valve function or if you have stenosis. Stenosis is when the heart valves are too narrow. If blood is flowing backward through the heart valves (regurgitation). A tumor or infectious growth around the heart valves. Areas of heart muscle that are not working well because of poor blood flow or injury from a heart attack. Aneurysm detection. An aneurysm is a weak or damaged part of an artery wall. The wall bulges out from the normal force of blood  pumping through the body. Tell a health care provider about: Any allergies you have. All medicines you are taking, including vitamins, herbs, eye drops, creams, and over-the-counter medicines. Any blood disorders you have. Any surgeries you have had. Any medical conditions you have. Whether you are pregnant  or may be pregnant. What are the risks? Generally, this is a safe test. However, problems may occur, including an allergic reaction to dye (contrast) that may be used during the test. What happens before the test? No specific preparation is needed. You may eat and drink normally. What happens during the test? You will take off your clothes from the waist up and put on a hospital gown. Electrodes or electrocardiogram (ECG)patches may be placed on your chest. The electrodes or patches are then connected to a device that monitors your heart rate and rhythm. You will lie down on a table for an ultrasound exam. A gel will be applied to your chest to help sound waves pass through your skin. A handheld device, called a transducer, will be pressed against your chest and moved over your heart. The transducer produces sound waves that travel to your heart and bounce back (or echo back) to the transducer. These sound waves will be captured in real-time and changed into images of your heart that can be viewed on a video monitor. The images will be recorded on a computer and reviewed by your health care provider. You may be asked to change positions or hold your breath for a short time. This makes it easier to get different views or better views of your heart. In some cases, you may receive contrast through an IV in one of your veins. This can improve the quality of the pictures from your heart. The procedure may vary among health care providers and hospitals.   What can I expect after the test? You may return to your normal, everyday life, including diet, activities, and medicines, unless your health care provider tells you not to do that. Follow these instructions at home: It is up to you to get the results of your test. Ask your health care provider, or the department that is doing the test, when your results will be ready. Keep all follow-up visits. This is important. Summary An echocardiogram is a test  that uses sound waves (ultrasound) to produce images of the heart. Images from an echocardiogram can provide important information about the size and shape of your heart, heart muscle function, heart valve function, and other possible heart problems. You do not need to do anything to prepare before this test. You may eat and drink normally. After the echocardiogram is completed, you may return to your normal, everyday life, unless your health care provider tells you not to do that. This information is not intended to replace advice given to you by your health care provider. Make sure you discuss any questions you have with your health care provider. Document Revised: 08/25/2019 Document Reviewed: 08/25/2019 [employer] Patient Education  2021 [employer]   Important Information About Sugar

## 2024-01-24 NOTE — Progress Notes (Signed)
 " Cardiology Office Note:    Date:  01/24/2024   ID:  Robert Santiago, DOB Apr 03, 1950, MRN [id number]  PCP:  Montey Lot, PA-C  Cardiologist:  Redell Leiter, MD    Referring MD: Montey Lot, PA-C    ASSESSMENT:    1. SOB (shortness of breath) on exertion   2. Essential hypertension   3. Coronary artery disease of native artery of native heart with stable angina pectoris   4. Elevated lipoprotein(a)    PLAN:    In order of problems listed above:  It appears to have developed decompensated heart failure.  I thought that he was on a loop diuretic he is not checked today BMP proBNP recheck in 1 week and start furosemide  40 mg daily. Recheck echocardiogram for EF Stable CAD continue current medical treatment Segmental pressures ABIs for lower extremity ambulatory pain consistent with claudication   Next appointment: 4 weeks   Medication Adjustments/Labs and Tests Ordered: Current medicines are reviewed at length with the patient today.  Concerns regarding medicines are outlined above.  Orders Placed This Encounter  Procedures   EKG 12-Lead   No orders of the defined types were placed in this encounter.    History of Present Illness:    Robert Santiago is a 74 y.o. male with a hx of CAD with CABG hypertension lipidemia and LV dysfunction last seen 01/22/2023.  He is seen today because he noticed worsening shortness of breath now with activity like climbing an incline or walking distance No orthopnea but he has untreated sleep apnea and both he and his wife have noticed edema He has mild cough but no wheezing He has no chest pain palpitation or syncope And describes having lower extremity pain with walking and now he wants to pursue a diagnosis  Compliance with diet, lifestyle and medications: Yes but he had salt to his diet Past Medical History:  Diagnosis Date   Acquired hypothyroidism 03/19/2019   Acute coronary syndrome (HCC) 12/30/2020   Anxiety 12/30/2020    Atherosclerosis of autologous vein coronary artery bypass graft(s) with unstable angina pectoris (HCC) 03/19/2019   BPH (benign prostatic hyperplasia) 05/04/2021   Cellulitis of foot 12/30/2020   Cellulitis of right leg 05/04/2021   Chest pain 12/30/2020   Closed fracture of cuneiform bone of foot 12/30/2020   Coronary artery disease involving native coronary artery of native heart with angina pectoris 03/19/2019   Cath 08/20/14: Conclusions Diagnostic Procedure Summary 1. Severe, diffuse multi-vessel CAD 2. Normal LV function Diagnostic Procedure Recommendations CTS consult Angiographic findings Cardiac Arteries and Lesion Findings LMCA: Normal. LAD: Lesion on Prox LAD: 80% stenosis. Lesion on Dist LAD: 80% stenosis. LCx: Lesion on Prox CX: Proximal subsection.80% stenosis. Lesion on 1st Ob Marg: 80% stenos   Coronary artery disease involving native coronary artery of native heart without angina pectoris 03/19/2019   Cath 08/20/14: Conclusions Diagnostic Procedure Summary 1. Severe, diffuse multi-vessel CAD 2. Normal LV function Diagnostic Procedure Recommendations CTS consult Angiographic findings Cardiac Arteries and Lesion Findings LMCA: Normal. LAD: Lesion on Prox LAD: 80% stenosis. Lesion on Dist LAD: 80% stenosis. LCx: Lesion on Prox CX: Proximal subsection.80% stenosis. Lesion on 1st Ob Marg: 80% stenos   Coronary artery disease of native artery of native heart with stable angina pectoris    COVID-19 08/2018   Diabetes mellitus without complication (HCC)    Diabetic ulcer of toe (HCC) 12/30/2020   Dyslipidemia 12/30/2020   Encounter for screening for diabetes mellitus 03/19/2019   IMOUPDATE  Essential hypertension    GERD without esophagitis 12/30/2020   Hx of CABG 08/23/2014   (Dr. Evia Mary) CABG x5: LIMA-LAD, SVG-D1, SVG-RI, SVG-OM 1, SVG-RPL.   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  LM-normal.  LAD: Proximal 80%, distal 80%; LCx: Proximal 80%, OM1 80%. -Normal EF. -->  Referred for CABG   Obesity (BMI 30-39.9) 05/07/2021   Osteomyelitis of left leg (HCC) 05/05/2021   Osteomyelitis of right foot (HCC) 05/04/2021   Osteomyelitis of toe of right foot (HCC) 12/30/2020   Palpitations with regular cardiac rhythm 12/30/2020   Poorly controlled diabetes mellitus (HCC) 12/30/2020   Progressive angina (HCC)-class III 12/19/2018   Status post cardiac catheterization 03/19/2019   Type 2 diabetes mellitus with diabetic polyneuropathy, with long-term current use of insulin  (HCC) 03/19/2019   Type 2 diabetes mellitus without complication 03/19/2019    Current Medications: Active Medications[1]    EKGs/Labs/Other Studies Reviewed:    The following studies were reviewed today:  Cardiac Studies & Procedures   ______________________________________________________________________________________________ CARDIAC CATHETERIZATION  CARDIAC CATHETERIZATION 02/09/2019  Conclusion  -------------NATIVE CORONARY ANGIOGRAPHY------------------  Mid LM to Ost LAD lesion is 90% stenosed with 99% stenosed side branch in Ost Cx to Prox Cx.  Ramus lesion is 95% stenosed.  Ost LAD to Prox LAD lesion is 99% stenosed. Prox LAD to Mid LAD lesion is 100% stenosed.  Ost RCA to Prox RCA lesion is 40% stenosed. Prox RCA to Dist RCA lesion is 100% stenosed with 100% stenosed side branch in RPAV.  -------------GRAFT ANGIOGRAPHY------------------  SVG-RPL graft was not visualized due to occlusion and inability to cannulate. Origin to Prox Graft lesion is 100% stenosed.  SVG-Ramus graft was not visualized due to occlusion. Origin to Prox Graft lesion is 100% stenosed.  SVG-Diag graft was visualized by angiography and is small. The graft exhibits  mild disease.  SVG-OM graft was visualized by angiography and is large. The graft exhibits no disease.  LIMA graft was visualized by angiography and is large. The graft exhibits no disease. (brisk L-R collaterals fill the RPDA with less prominent filling of the RPA V)  There is mild to moderate left ventricular systolic dysfunction.  LV end diastolic pressure is mildly elevated.  The left ventricular ejection fraction is 45-50% by visual estimate.  There is no aortic valve stenosis.  SUMMARY  Severe native coronary artery disease with essentially subtotal occlusion of the Left Main-LAD, Ramus Intermedius (RI) and LCx trifurcation with severe disease in the proximal part of the RI  2 of 5 grafts occluded (unable to visualize via direct cannulation or root angiogram) with patent LIMA-LAD, SVG-diagonal and SVG-OM (however difficult to tell if this is the OM or ramus that is grafted)  EF does appear to be mildly reduced of roughly 45% with lateral hypokinesis.  No obvious PCI target.  Likely culprit for ischemia with occluded graft to the RI   RECOMMENDATIONS  Discharge home after bedrest  Optimize medical management. ->  Consider antianginal such as ranolazine, amlodipine and Imdur.  Could also potentially titrate up beta-blocker.  Aggressive risk factor modification.    Alm Clay, MD  Findings Coronary Findings Diagnostic  Dominance: Right  Left Main Mid LM to Ost LAD lesion is 90% stenosed with 99% stenosed side branch in [place]  Cx to Prox Cx. The lesion is located at the bifurcation. The lesion is severely calcified.  Left Anterior Descending Ost LAD to Prox LAD lesion is 99% stenosed. The lesion is severely calcified. Prox LAD to Mid LAD lesion is 100% stenosed. The lesion is chronically occluded.  First Diagonal Branch Vessel is small in size.  Second Diagonal Branch Vessel is small in size.  Third Diagonal Branch Vessel is small in size.  Third Septal  Branch Vessel is small in size.  Left Anterior Descending Ost LAD to Prox LAD lesion is 99% stenosed. The lesion is severely calcified. Prox LAD to Mid LAD lesion is 100% stenosed. The lesion is chronically occluded.  First Diagonal Branch Vessel is small in size.  Second Diagonal Branch Vessel is small in size.  Third Diagonal Branch Vessel is small in size.  Third Septal Branch Vessel is small in size.  Ramus Intermedius There is moderate diffuse disease throughout the vessel. Ramus lesion is 95% stenosed. The lesion is severely calcified.  Left Circumflex Vessel is small. There is severe diffuse disease throughout the vessel.  First Obtuse Marginal Branch  Second Obtuse Marginal Branch Vessel is small in size.  Right Coronary Artery Ost RCA to Prox RCA lesion is 40% stenosed. The lesion is calcified. Prox RCA to Dist RCA lesion is 100% stenosed with 100% stenosed side branch in RPAV.  Acute Marginal Branch Vessel is small in size.  Right Ventricular Branch Vessel is small in size.  Right Posterior Descending Artery Collaterals RPDA filled by collaterals from 3rd Sept.  Collaterals RPDA filled by collaterals from Dist LAD.  Right Posterior Atrioventricular Artery Vessel is small in size. There is moderate disease in the vessel.  First Right Posterolateral Branch There is moderate disease in the vessel. Collaterals 1st RPL filled by collaterals from 3rd Diag.  Saphenous Graft To RPAV SVG graft was not visualized due to known occlusion and inability to cannulate. SVG-RPL Origin to Prox Graft lesion is 100% stenosed. The lesion is chronically occluded.  Saphenous Graft To Ramus SVG graft was not visualized due to known occlusion. SVG-RI - injected, flush occluded Origin to Prox Graft lesion is 100% stenosed. The lesion is chronically occluded.  Saphenous Graft To 1st Diag SVG graft was visualized by angiography and is small. SVG-Diag The graft exhibits  mild .  Saphenous Graft To 1st Mrg SVG graft was visualized by angiography and is large. SVG-OM  LIMA LIMA Graft To Mid LAD LIMA graft was visualized by angiography and is large. The graft exhibits no disease.  Intervention  No interventions have been documented.   STRESS TESTS  MYOCARDIAL PERFUSION IMAGING 01/28/2019  Interpretation Summary  Nuclear stress EF: 54%.  The left ventricular ejection fraction is mildly decreased (45-54%).  There was no ST segment deviation noted during stress.  Defect 1: There is a medium defect of moderate severity present in the basal inferolateral and mid inferolateral location.  Findings consistent with ischemia.  This is an intermediate risk study.   ECHOCARDIOGRAM  ECHOCARDIOGRAM COMPLETE 08/06/2022  Narrative ECHOCARDIOGRAM REPORT    Patient Name:   Robert Santiago Date of Exam: 08/06/2022 Medical Rec #:  [id number]       Height:       73.0 in Accession #:    [id number]      Weight:       286.0 lb Date of Birth:  [birth year]       BSA:  2.505 m Patient Age:    72 years        BP:           110/68 mmHg Patient Gender: M               HR:           64 bpm. Exam Location:  [facility]  Procedure: 2D Echo, Cardiac Doppler, Color Doppler and Strain Analysis  Indications:    Coronary artery disease of native artery of native heart with stable angina pectoris (HCC) [I25.118 (ICD-10-CM)]; Essential hypertension [I10 (ICD-10-CM)]; Elevated lipoprotein(a) [E78.41 (ICD-10-CM)]; Type 2 diabetes mellitus without complication, without long-term current use of insulin  (HCC) [E11.9 (ICD-10-CM)]  History:        Patient has no prior history of Echocardiogram examinations. Signs/Symptoms:Chest Pain.  Sonographer:    Lynwood Silvas RDCS Referring Phys: [id number] REDELL JINNY LEITER   Sonographer Comments: Technically challenging study due to limited acoustic windows. IMPRESSIONS   1. Left ventricular ejection fraction, by estimation, is 60 to  65%. The left ventricle has normal function. The left ventricle has no regional wall motion abnormalities. There is mild concentric left ventricular hypertrophy. Left ventricular diastolic parameters are consistent with Grade II diastolic dysfunction (pseudonormalization). Elevated left atrial pressure. 2. Right ventricular systolic function is normal. The right ventricular size is normal. Tricuspid regurgitation signal is inadequate for assessing PA pressure. 3. The mitral valve is normal in structure. No evidence of mitral valve regurgitation. No evidence of mitral stenosis. 4. The aortic valve is tricuspid. Aortic valve regurgitation is not visualized. No aortic stenosis is present. 5. Aortic Normal DTA. 6. The inferior vena cava is normal in size with greater than 50% respiratory variability, suggesting right atrial pressure of 3 mmHg.  FINDINGS Left Ventricle: Left ventricular ejection fraction, by estimation, is 60 to 65%. The left ventricle has normal function. The left ventricle has no regional wall motion abnormalities. Definity  contrast agent was given IV to delineate the left ventricular endocardial borders. The left ventricular internal cavity size was normal in size. There is mild concentric left ventricular hypertrophy. Left ventricular diastolic parameters are consistent with Grade II diastolic dysfunction (pseudonormalization). Elevated left atrial pressure.   LV Wall Scoring: The mid inferior segment and basal inferoseptal segment are hypokinetic. The entire anterior wall, entire lateral wall, entire anterior septum, entire apex, mid inferoseptal segment, and basal inferior segment are normal.  Right Ventricle: The right ventricular size is normal. No increase in right ventricular wall thickness. Right ventricular systolic function is normal. Tricuspid regurgitation signal is inadequate for assessing PA pressure. The tricuspid regurgitant velocity is 1.37 m/s, and with an assumed  right atrial pressure of 3 mmHg, the estimated right ventricular systolic pressure is 10.5 mmHg.  Left Atrium: Left atrial size was normal in size.  Right Atrium: Right atrial size was normal in size.  Pericardium: There is no evidence of pericardial effusion.  Mitral Valve: The mitral valve is normal in structure. No evidence of mitral valve regurgitation. No evidence of mitral valve stenosis.  Tricuspid Valve: The tricuspid valve is normal in structure. Tricuspid valve regurgitation is trivial. No evidence of tricuspid stenosis.  Aortic Valve: The aortic valve is tricuspid. Aortic valve regurgitation is not visualized. No aortic stenosis is present.  Pulmonic Valve: The pulmonic valve was normal in structure. Pulmonic valve regurgitation is not visualized. No evidence of pulmonic stenosis.  Aorta: The aortic root and ascending aorta are structurally normal, with no evidence of dilitation, the aortic arch was  not well visualized and Normal DTA.  Venous: A systolic blunting flow pattern is recorded from the right upper pulmonary vein. The inferior vena cava is normal in size with greater than 50% respiratory variability, suggesting right atrial pressure of 3 mmHg.  IAS/Shunts: No atrial level shunt detected by color flow Doppler.   LEFT VENTRICLE PLAX 2D LVIDd:         5.50 cm   Diastology LVIDs:         3.70 cm   LV e' medial:    8.27 cm/s LV PW:         1.30 cm   LV E/e' medial:  13.4 LV IVS:        1.40 cm   LV e' lateral:   12.60 cm/s LVOT diam:     2.00 cm   LV E/e' lateral: 8.8 LV SV:         62 LV SV Index:   25 LVOT Area:     3.14 cm   RIGHT VENTRICLE            IVC RV S prime:     8.05 cm/s  IVC diam: 1.40 cm TAPSE (M-mode): 1.8 cm  LEFT ATRIUM             Index        RIGHT ATRIUM           Index LA diam:        3.70 cm 1.48 cm/m   RA Area:     15.20 cm LA Vol (A2C):   91.5 ml 36.53 ml/m  RA Volume:   37.90 ml  15.13 ml/m LA Vol (A4C):   74.3 ml 29.66  ml/m LA Biplane Vol: 82.3 ml 32.86 ml/m AORTIC VALVE LVOT Vmax:   86.45 cm/s LVOT Vmean:  58.150 cm/s LVOT VTI:    0.198 m  AORTA Ao Root diam: 3.90 cm Ao Asc diam:  3.60 cm Ao Desc diam: 2.50 cm  MV E velocity: 111.00 cm/s  TRICUSPID VALVE MV A velocity: 100.00 cm/s  TR Peak grad:   7.5 mmHg MV E/A ratio:  1.11         TR Vmax:        137.00 cm/s  SHUNTS Systemic VTI:  0.20 m Systemic Diam: 2.00 cm  Redell Leiter MD Electronically signed by Redell Leiter MD Signature Date/Time: 08/06/2022/4:48:03 PM    Final          ______________________________________________________________________________________________      EKG Interpretation Date/Time:  Friday January 24 2024 08:05:16 EST Ventricular Rate:  66 PR Interval:  206 QRS Duration:  158 QT Interval:  438 QTC Calculation: 459 R Axis:   100  Text Interpretation: Sinus rhythm with Premature atrial complexes Right bundle branch block T wave abnormality, consider inferolateral ischemia When compared with ECG of 22-Oct-2023 13:50, No sign change Confirmed by Leiter Redell ([id number]) on 01/24/2024 8:09:53 AM   Recent Labs: No results found for requested labs within last 365 days.  Recent Lipid Panel    Component Value Date/Time   CHOL 141 08/15/2021 1324   TRIG 142 08/15/2021 1324   HDL 47 08/15/2021 1324   CHOLHDL 3.0 08/15/2021 1324   LDLCALC 69 08/15/2021 1324    Physical Exam:    VS:  BP 138/68   Pulse 66   Ht 6' (1.829 m)   Wt 287 lb 12.8 oz (130.5 kg)   SpO2 96%   BMI 39.03 kg/m     Wt  Readings from Last 3 Encounters:  01/24/24 287 lb 12.8 oz (130.5 kg)  10/22/23 285 lb 9.6 oz (129.5 kg)  07/12/22 286 lb (129.7 kg)     GEN: Obese BMI approaches 40 well nourished, well developed in no acute distress HEENT: Normal NECK: No JVD; No carotid bruits LYMPHATICS: No lymphadenopathy CARDIAC: Distant heart sounds RRR, no murmurs, rubs, gallops RESPIRATORY:  Clear to auscultation without rales,  wheezing or rhonchi  ABDOMEN: Soft, non-tender, non-distended MUSCULOSKELETAL: He has bilateral lower extremity marked pitting edema; No deformity  SKIN: Warm and dry NEUROLOGIC:  Alert and oriented x 3 PSYCHIATRIC:  Normal affect    Signed, Redell Leiter, MD  01/24/2024 8:29 AM    [facility]      [1]  Current Meds  Medication Sig   ACCU-CHEK GUIDE TEST test strip 1 each.   ALPRAZolam (XANAX) 0.5 MG tablet Take 0.5 mg by mouth daily as needed for anxiety (panic attacks).   aspirin  EC 81 MG tablet Take 81 mg by mouth at bedtime.   benazepril  (LOTENSIN ) 10 MG tablet TAKE 1 TABLET BY MOUTH EVERY DAY   buPROPion (WELLBUTRIN XL) 150 MG 24 hr tablet Take 150 mg by mouth daily.   busPIRone  (BUSPAR ) 10 MG tablet Take 10 mg by mouth 2 (two) times daily.   clopidogrel  (PLAVIX ) 75 MG tablet Take 1 tablet (75 mg total) by mouth daily.   DULoxetine  (CYMBALTA ) 60 MG capsule Take 120 mg by mouth at bedtime.   EMBECTA PEN NEEDLE NANO 2 GEN 32G X 4 MM MISC    EMBECTA PEN NEEDLE ULTRAFINE 31G X 5 MM MISC SMARTSIG:1 injection   ezetimibe  (ZETIA ) 10 MG tablet Take 10 mg by mouth at bedtime.   insulin  aspart (NOVOLOG  FLEXPEN) 100 UNIT/ML FlexPen Inject 10 Units into the skin every evening.   LANTUS  SOLOSTAR 100 UNIT/ML Solostar Pen Inject 50-70 Units into the skin at bedtime.   levothyroxine  (SYNTHROID ) 150 MCG tablet Take 150 mcg by mouth daily.   metFORMIN (GLUCOPHAGE) 1000 MG tablet Take 1,000 mg by mouth 2 (two) times daily.   metoprolol  tartrate (LOPRESSOR ) 25 MG tablet Take 25 mg by mouth 2 (two) times daily.    nitroGLYCERIN  (NITROSTAT ) 0.4 MG SL tablet Place 1 tablet (0.4 mg total) under the tongue every 5 (five) minutes as needed for chest pain.   omeprazole (PRILOSEC) 40 MG capsule Take 40 mg by mouth every morning.   rosuvastatin  (CRESTOR ) 10 MG tablet Take 10 mg by mouth at bedtime.   tamsulosin  (FLOMAX ) 0.4 MG CAPS capsule Take 0.4 mg by mouth at bedtime.    [DISCONTINUED] Semaglutide,0.25 or 0.5MG /DOS, (OZEMPIC, 0.25 OR 0.5 MG/DOSE,) 2 MG/1.5ML SOPN Inject 0.25 mg into the skin once a week.   "

## 2024-01-25 LAB — BASIC METABOLIC PANEL WITH GFR
BUN/Creatinine Ratio: 19 (ref 10–24)
BUN: 16 mg/dL (ref 8–27)
CO2: 22 mmol/L (ref 20–29)
Calcium: 9.8 mg/dL (ref 8.6–10.2)
Chloride: 99 mmol/L (ref 96–106)
Creatinine, Ser: 0.86 mg/dL (ref 0.76–1.27)
Glucose: 180 mg/dL — ABNORMAL HIGH (ref 70–99)
Potassium: 4.4 mmol/L (ref 3.5–5.2)
Sodium: 137 mmol/L (ref 134–144)
eGFR: 91 mL/min/1.73

## 2024-01-25 LAB — PRO B NATRIURETIC PEPTIDE: NT-Pro BNP: 581 pg/mL — ABNORMAL HIGH (ref 0–376)

## 2024-01-27 ENCOUNTER — Ambulatory Visit: Payer: Self-pay

## 2024-01-30 ENCOUNTER — Telehealth: Payer: Self-pay | Admitting: Cardiology

## 2024-01-30 NOTE — Telephone Encounter (Signed)
 Called the patient and he reported that he was placed on lasix  40 mg daily due to swelling in his lower extremities. Today he reports that his swelling in the lower extremities has resolved. He is concerned because he is not urinating as much as he had been before the swelling resolved. Patient states that he doesn't have any kidney disease that he is aware of. Please advise.

## 2024-01-30 NOTE — Telephone Encounter (Signed)
 Pt c/o medication issue:  1. Name of Medication:   furosemide  (LASIX ) 40 MG tablet    2. How are you currently taking this medication (dosage and times per day)?  Take 1 tablet (40 mg total) by mouth daily.       3. Are you having a reaction (difficulty breathing--STAT)? No  4. What is your medication issue? Pt's spouse Marval is requesting a call to the pt regarding them feeling as if this medication isn't working for the pt. She stated he's been gaining instead of losing. Please advise.

## 2024-02-03 NOTE — Telephone Encounter (Signed)
 Left vm to return call

## 2024-02-04 NOTE — Telephone Encounter (Signed)
 Called the patient and informed him of Dr. Madireddy's recommendation below:  Is not uncommon for the amount of diuresis to decrease once the excess salt and fluid is removed. Continue with the blood work as recommended by Dr. Monetta at his recent office visit and monitor for any significant electrolyte changes.  Patient verbalized understanding and had no further questions at this time.

## 2024-02-25 ENCOUNTER — Ambulatory Visit

## 2024-02-28 ENCOUNTER — Ambulatory Visit: Admitting: Cardiology
